# Patient Record
Sex: Male | Born: 1955 | Race: White | Hispanic: No | Marital: Married | Smoking: Never smoker
Health system: Southern US, Community
[De-identification: ages and names within clinical notes are randomized; demographics above are authoritative.]

## PROBLEM LIST (undated history)

## (undated) DIAGNOSIS — C801 Malignant (primary) neoplasm, unspecified: Secondary | ICD-10-CM

## (undated) DIAGNOSIS — R5383 Other fatigue: Secondary | ICD-10-CM

## (undated) DIAGNOSIS — I4892 Unspecified atrial flutter: Secondary | ICD-10-CM

## (undated) DIAGNOSIS — I4891 Unspecified atrial fibrillation: Secondary | ICD-10-CM

## (undated) DIAGNOSIS — E785 Hyperlipidemia, unspecified: Secondary | ICD-10-CM

## (undated) DIAGNOSIS — T8859XA Other complications of anesthesia, initial encounter: Secondary | ICD-10-CM

## (undated) HISTORY — DX: Hyperlipidemia, unspecified: E78.5

## (undated) HISTORY — DX: Unspecified atrial fibrillation: I48.91

## (undated) HISTORY — DX: Unspecified atrial flutter: I48.92

## (undated) HISTORY — DX: Other fatigue: R53.83

## (undated) HISTORY — PX: CARDIOVERSION: SHX1299

## (undated) HISTORY — PX: MINIMALLY INVASIVE MAZE PROCEDURE: SHX6244

## (undated) HISTORY — PX: CHOLECYSTECTOMY: SHX55

---

## 2004-04-03 HISTORY — PX: GALLBLADDER SURGERY: SHX652

## 2004-06-23 ENCOUNTER — Emergency Department (HOSPITAL_COMMUNITY): Admission: EM | Admit: 2004-06-23 | Discharge: 2004-06-23 | Payer: Self-pay | Admitting: Family Medicine

## 2004-06-30 ENCOUNTER — Ambulatory Visit: Payer: Self-pay | Admitting: Internal Medicine

## 2004-07-14 ENCOUNTER — Ambulatory Visit: Payer: Self-pay | Admitting: Family Medicine

## 2004-07-22 ENCOUNTER — Encounter: Admission: RE | Admit: 2004-07-22 | Discharge: 2004-07-22 | Payer: Self-pay | Admitting: Internal Medicine

## 2004-08-15 ENCOUNTER — Ambulatory Visit: Payer: Self-pay | Admitting: Internal Medicine

## 2004-09-23 ENCOUNTER — Encounter: Admission: RE | Admit: 2004-09-23 | Discharge: 2004-09-23 | Payer: Self-pay | Admitting: Family Medicine

## 2005-02-09 ENCOUNTER — Ambulatory Visit (HOSPITAL_COMMUNITY): Admission: RE | Admit: 2005-02-09 | Discharge: 2005-02-09 | Payer: Self-pay | Admitting: Surgery

## 2006-04-02 IMAGING — US US RENAL
1 series · 14 of 25 positions shown · non-contrast
Comparison: none

CLINICAL DATA: Recurrent UTI. 
 RENAL ULTRASOUND: 

 Both kidneys are within normal limits in size and parenchymal echogenicity. There is no evidence of renal parenchymal lesions or hydronephrosis.  The right kidney measures 12.4cm long with the left kidney measuring 12.1cm long.  Urinary bladder is sonographically unremarkable.

[Series 1: unknown · 0.28mm/px · 14 of 30 slices shown]
[im 1/30]
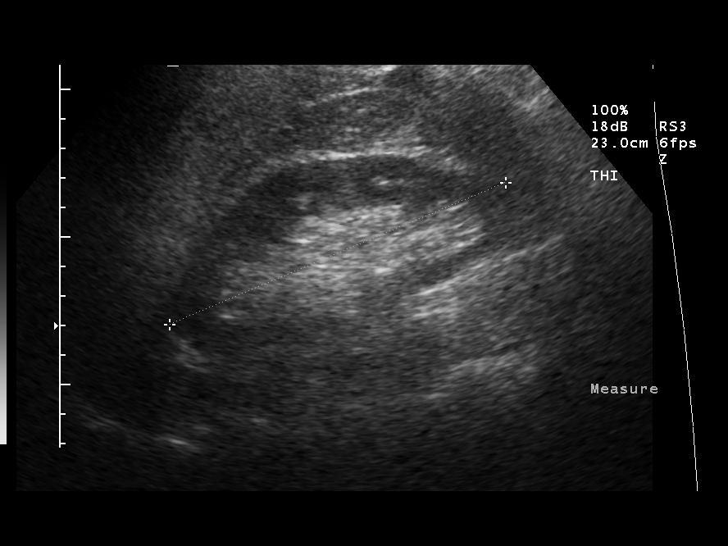
[im 3/30]
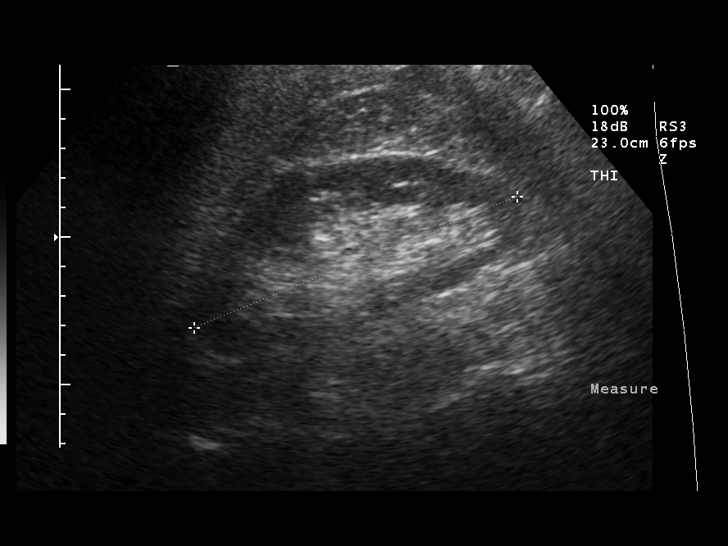
[im 5/30]
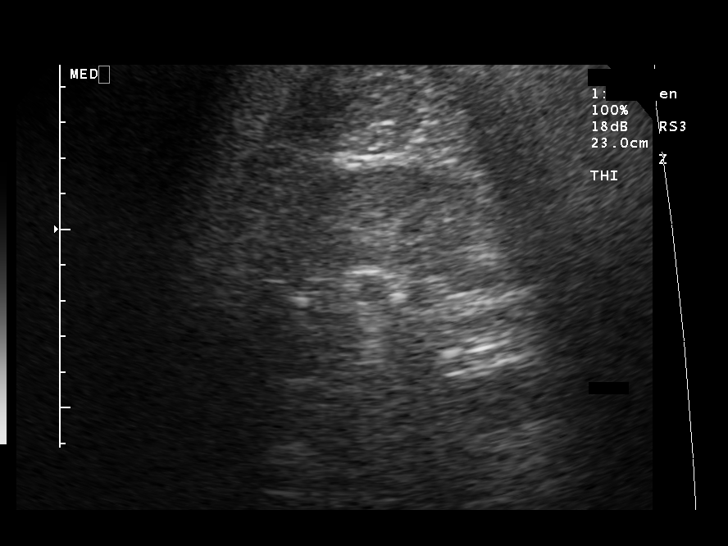
[im 8/30]
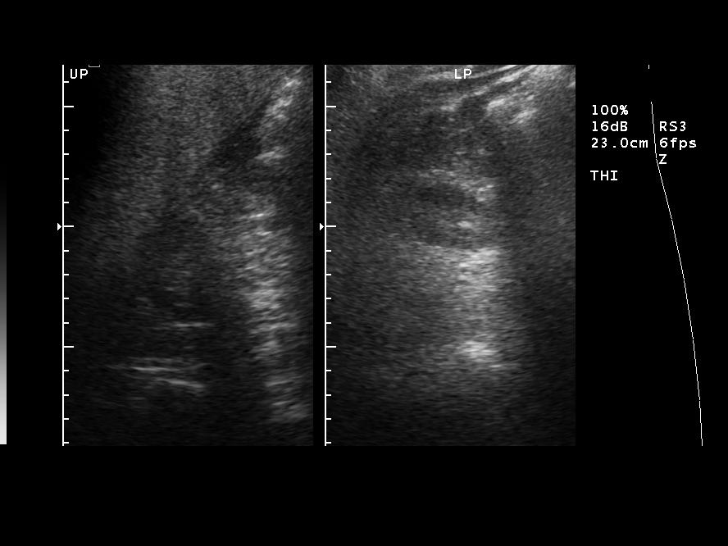
[im 10/30]
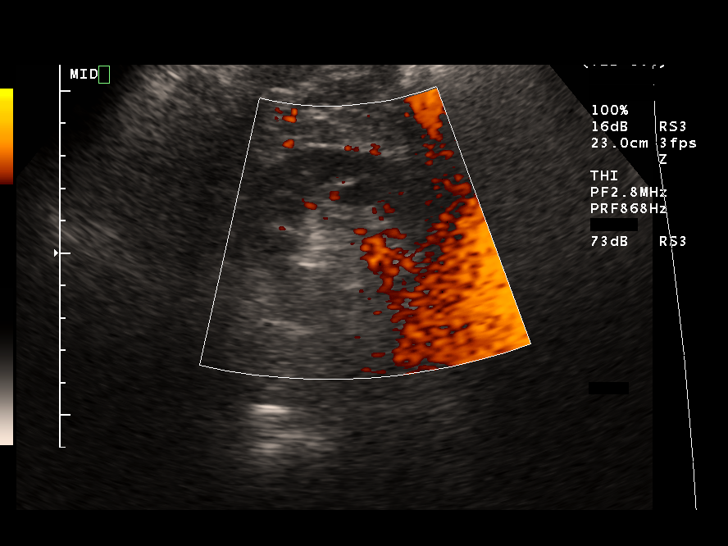
[im 11/30]
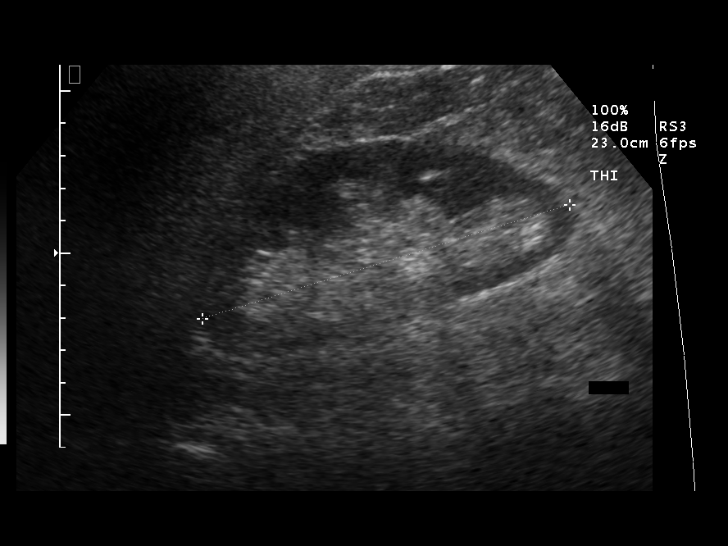
[im 14/30]
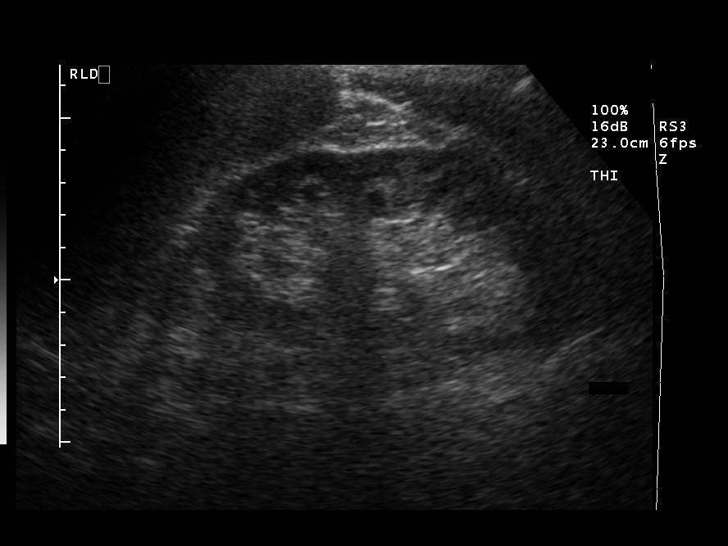
[im 16/30]
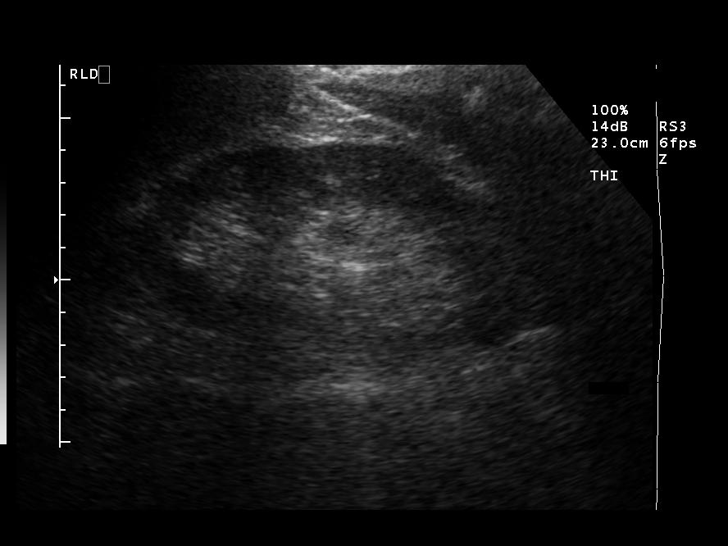
[im 19/30]
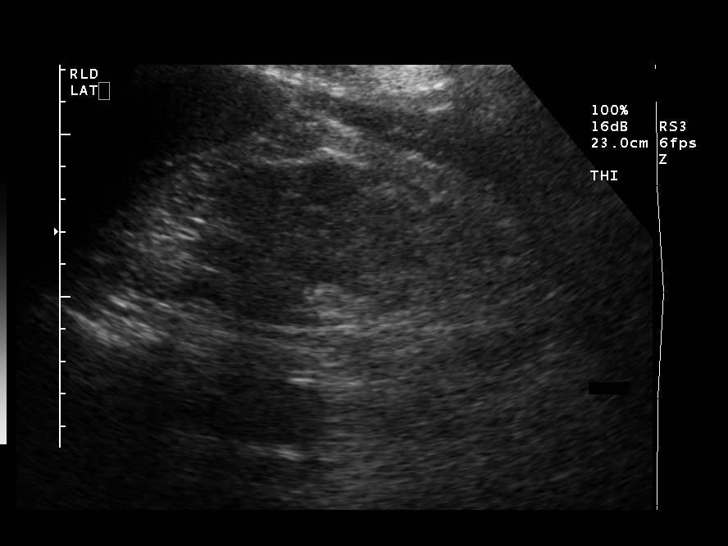
[im 20/30]
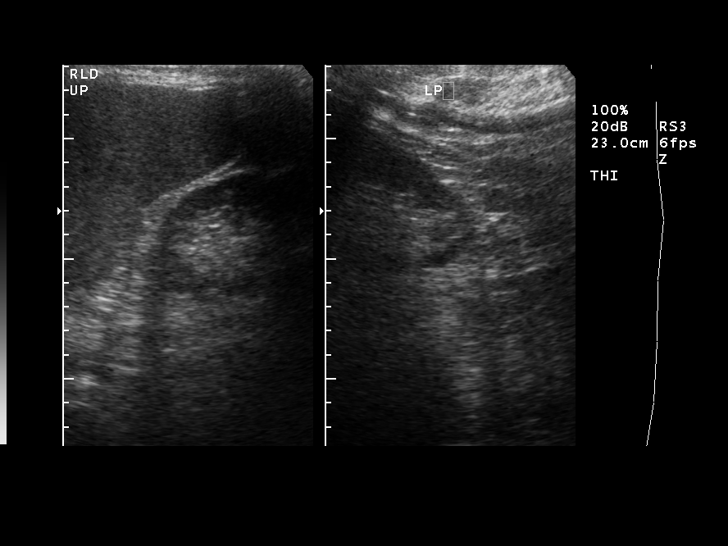
[im 22/30]
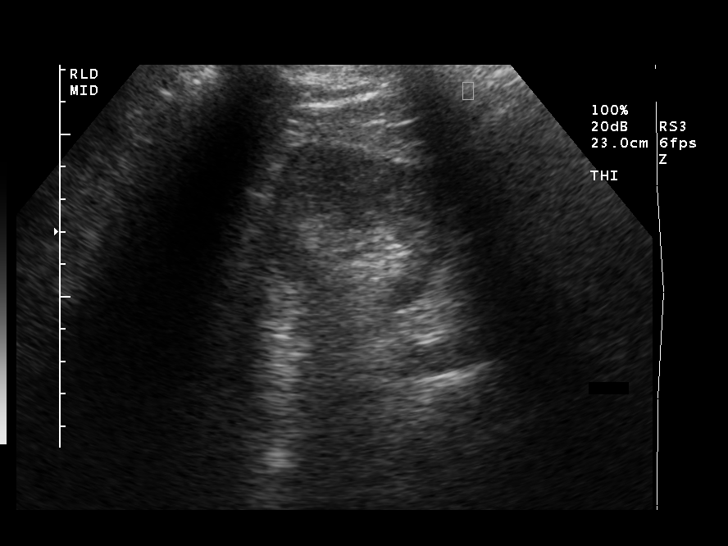
[im 25/30]
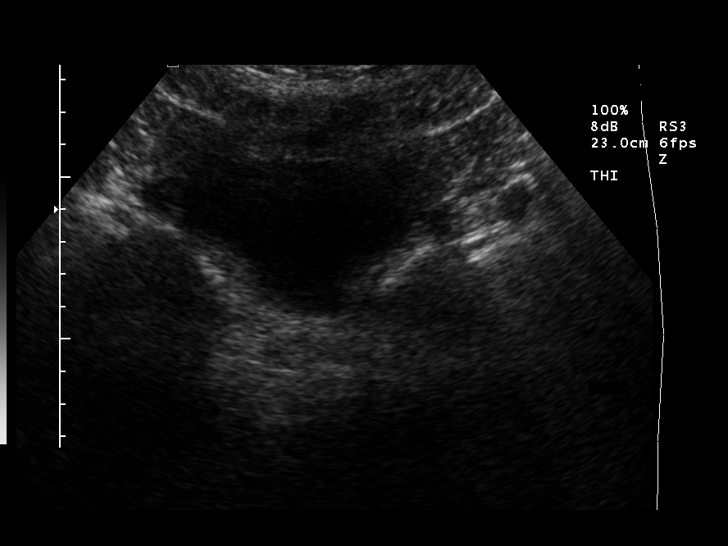
[im 27/30]
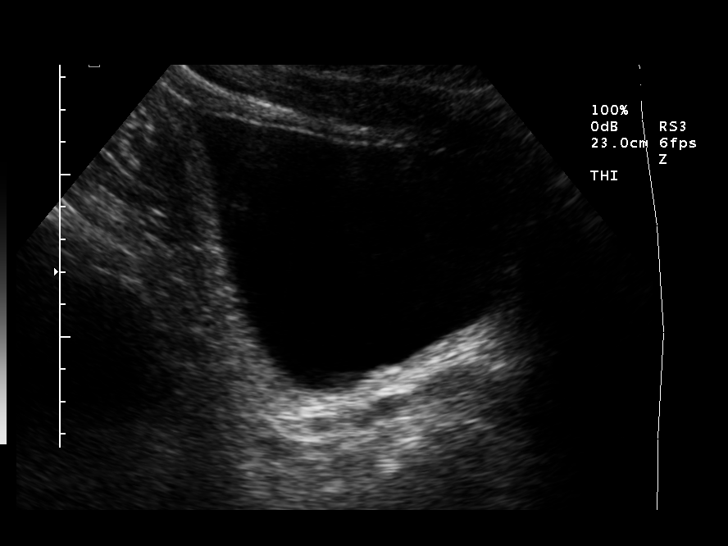
[im 30/30]
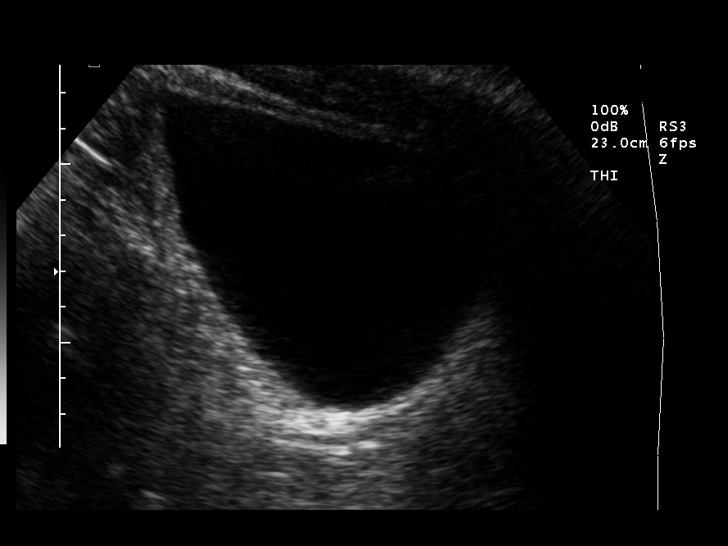

[14 of 25 positions shown; findings below may reference images not displayed]

IMPRESSION: Normal study.

## 2006-06-04 IMAGING — US US ABDOMEN COMPLETE
1 series · 14 of 25 positions shown · non-contrast
Comparison: None.

CLINICAL DATA: Right upper quadrant pain.  
 COMPLETE ABDOMINAL ULTRASOUND:

[Series 1: unknown · 0.27mm/px · 14 of 66 slices shown]
[im 1/66]
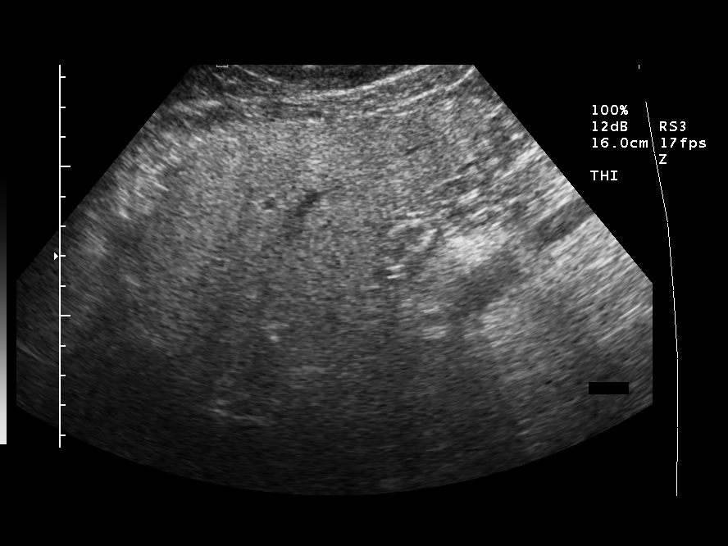
[im 6/66]
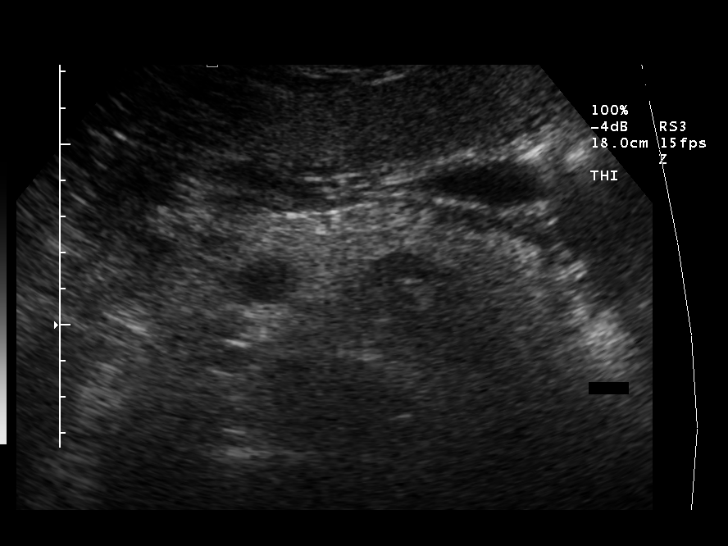
[im 11/66]
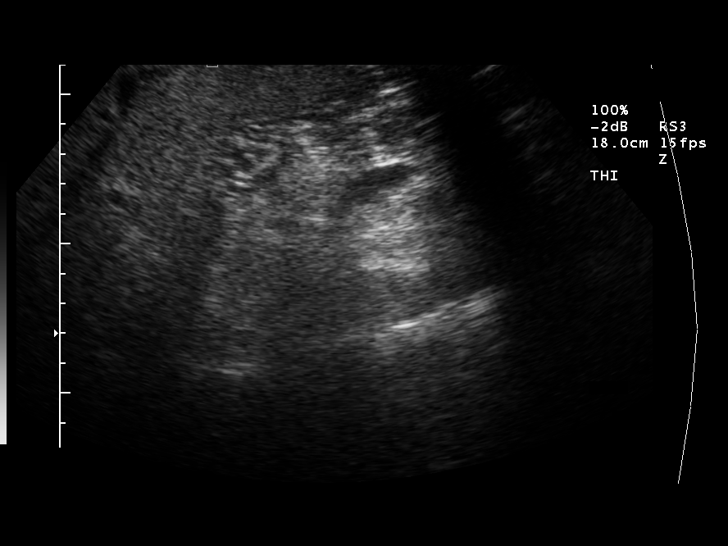
[im 17/66]
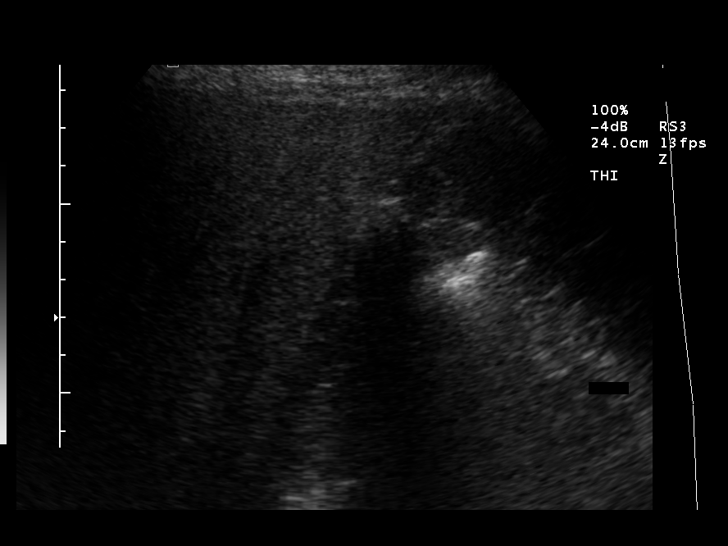
[im 22/66]
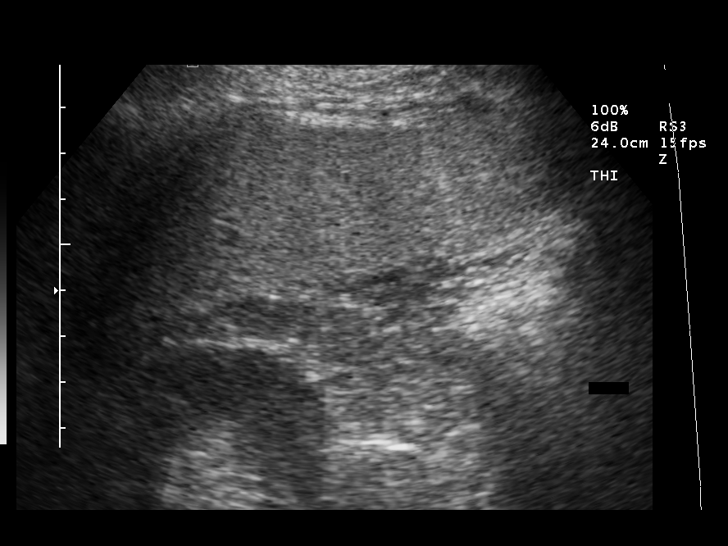
[im 25/66]
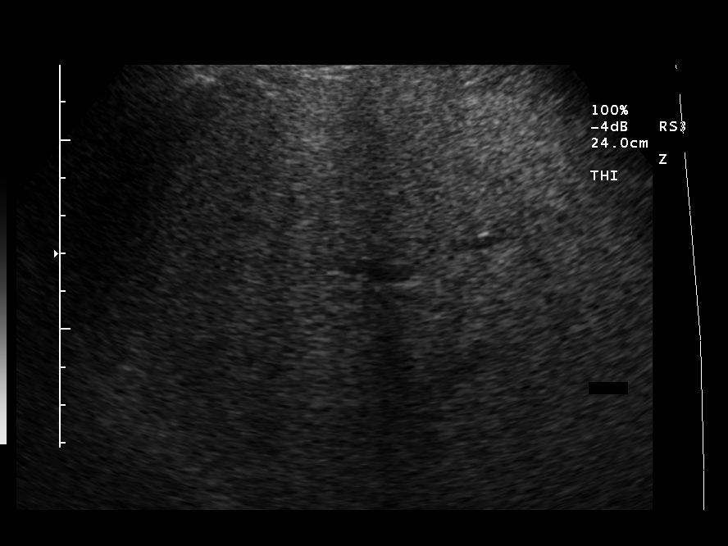
[im 30/66]
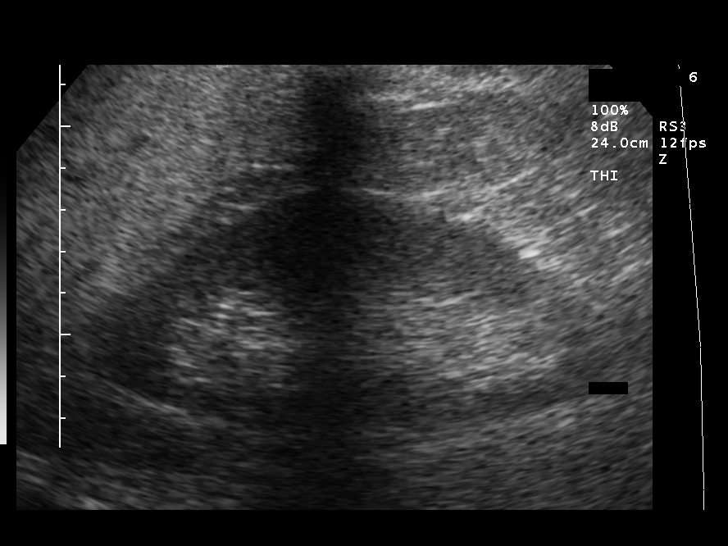
[im 36/66]
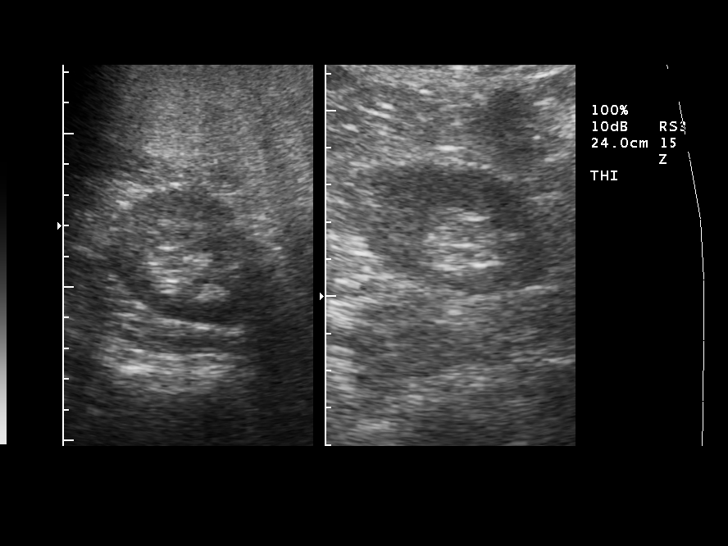
[im 41/66]
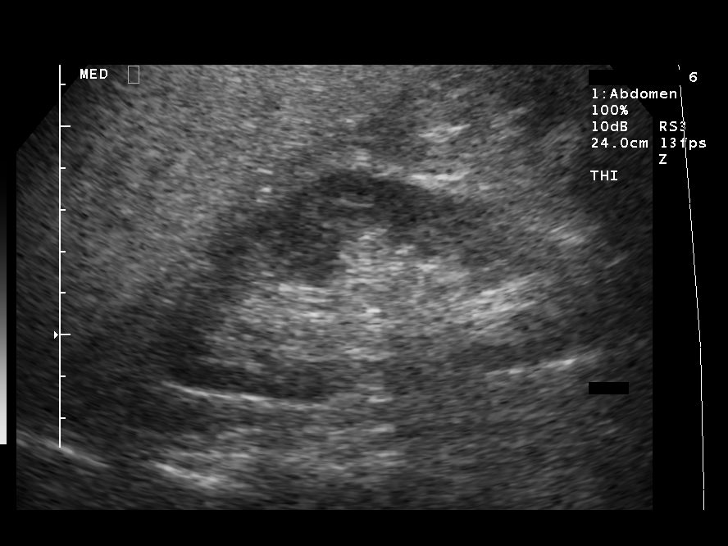
[im 44/66]
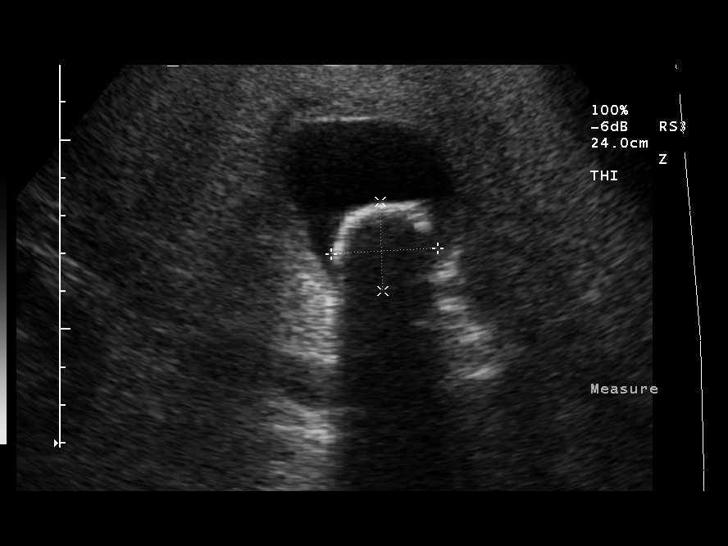
[im 49/66]
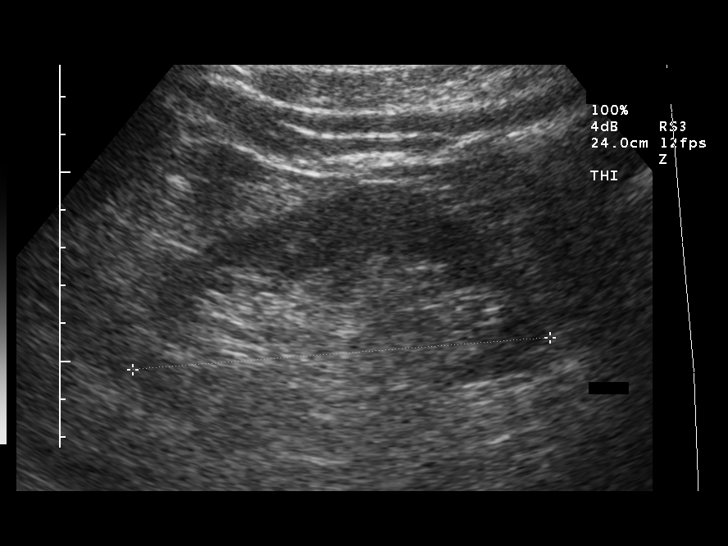
[im 55/66]
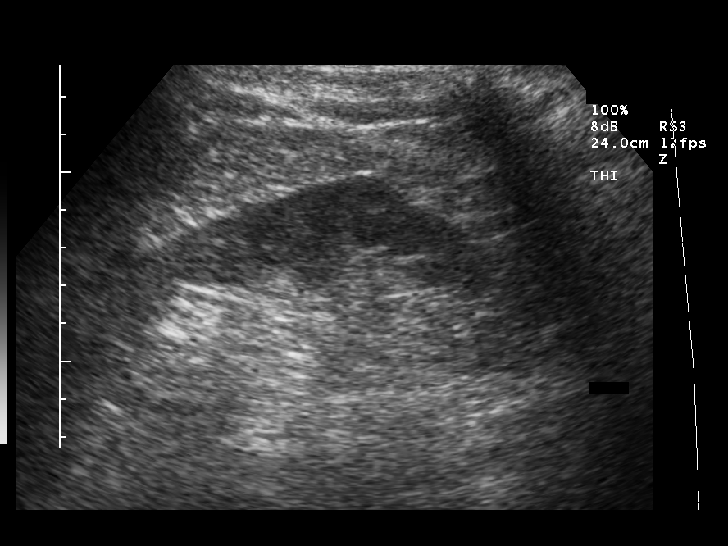
[im 60/66]
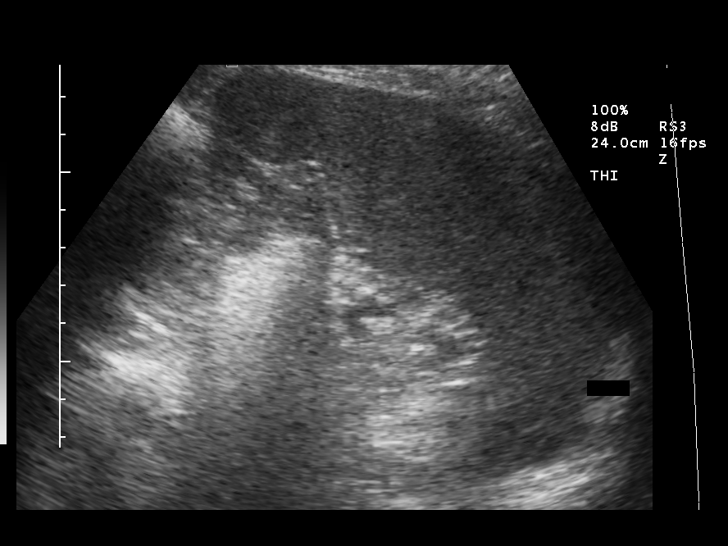
[im 66/66]
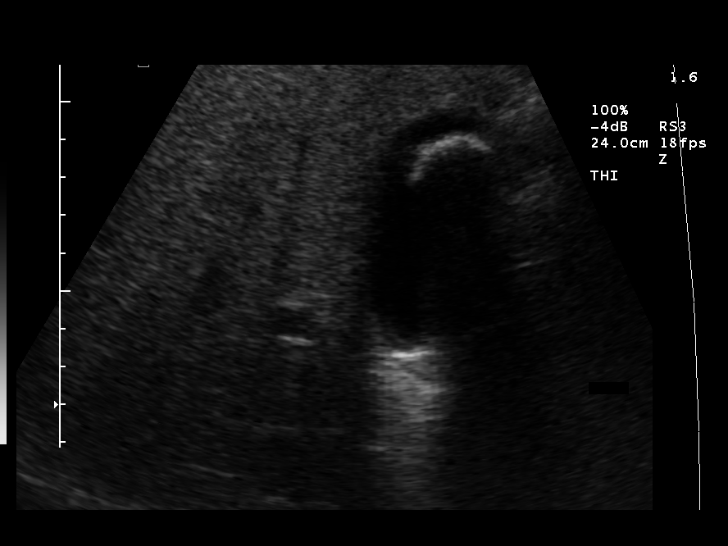

[14 of 25 positions shown; findings below may reference images not displayed]

There is a large (2.8 x 2.4cm) gallstone within the lumen of the gallbladder, which is not significantly dilated.  No significant wall thickening or edema.  Common duct 4.3mm.  
 There are no focal lesions of the liver or spleen.  Pancreas is suboptimally visualized due to overlying bowel gas.  Kidneys unremarkable.  The proximal aorta and much of the IVC are obscured by bowel gas.  The mid abdominal aorta is 2.2cm.  No ascites.
IMPRESSION: 1.  This study is technically limited due to bowel gas and the patient?s body habitus ? the pancreas, IVC, and some of the aorta are obscured. 
 2.  There is a large gallstone.

## 2006-10-21 IMAGING — RF DG CHOLANGIOGRAM OPERATIVE
1 series · 4 of 4 positions shown · non-contrast
Comparison: none

CLINICAL DATA: Cholelithiasis

[Series 1: run · 4 of 67 frames shown]
[frame 11/67]
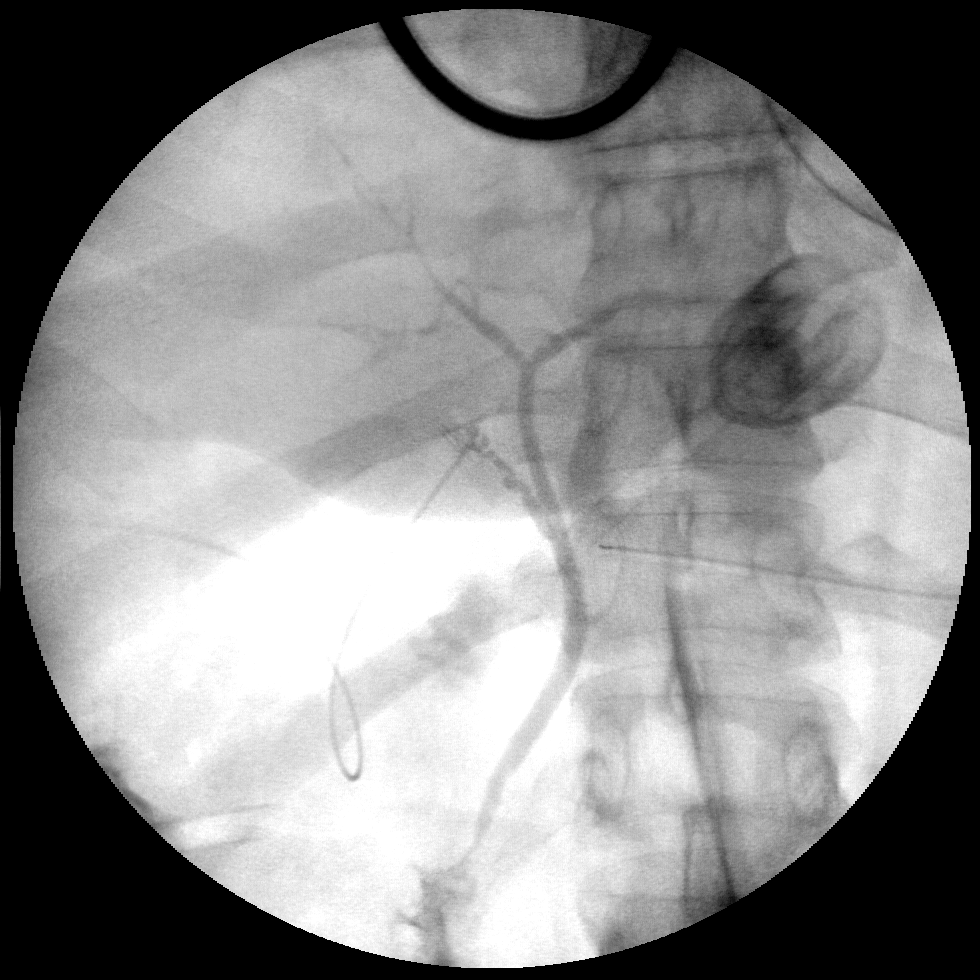
[frame 34/67]
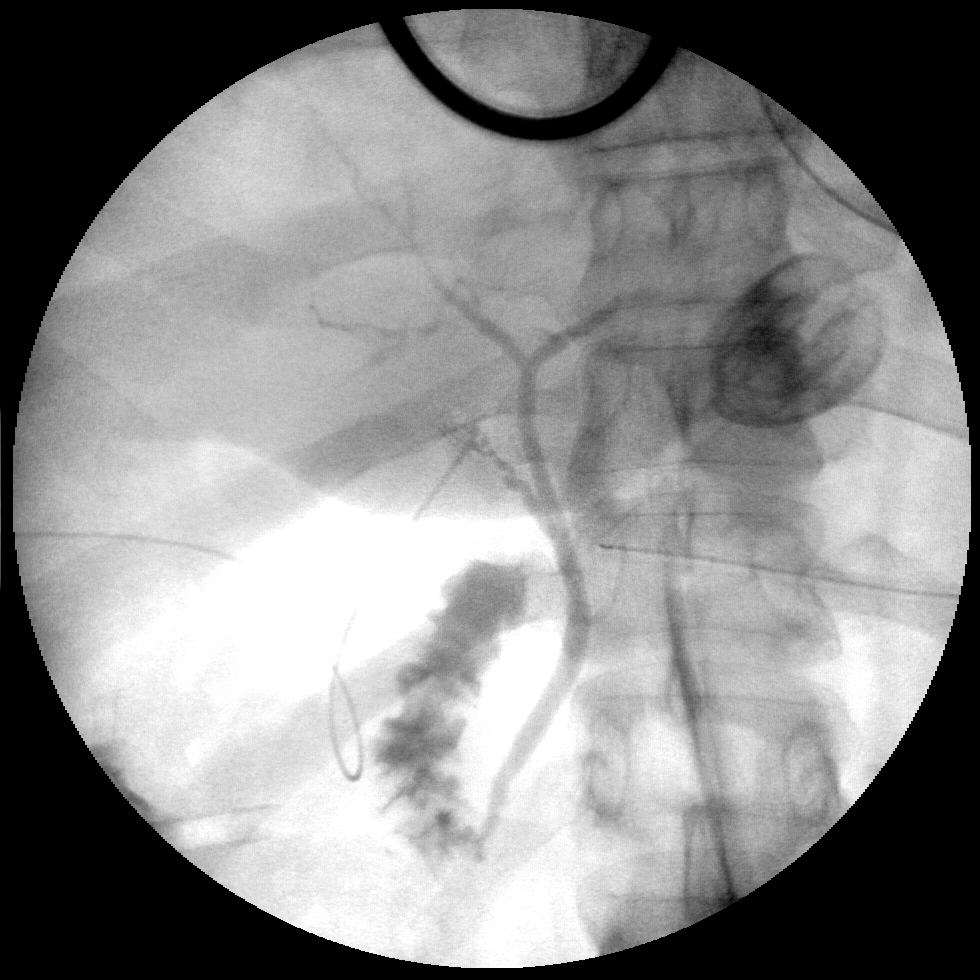
[frame 57/67]
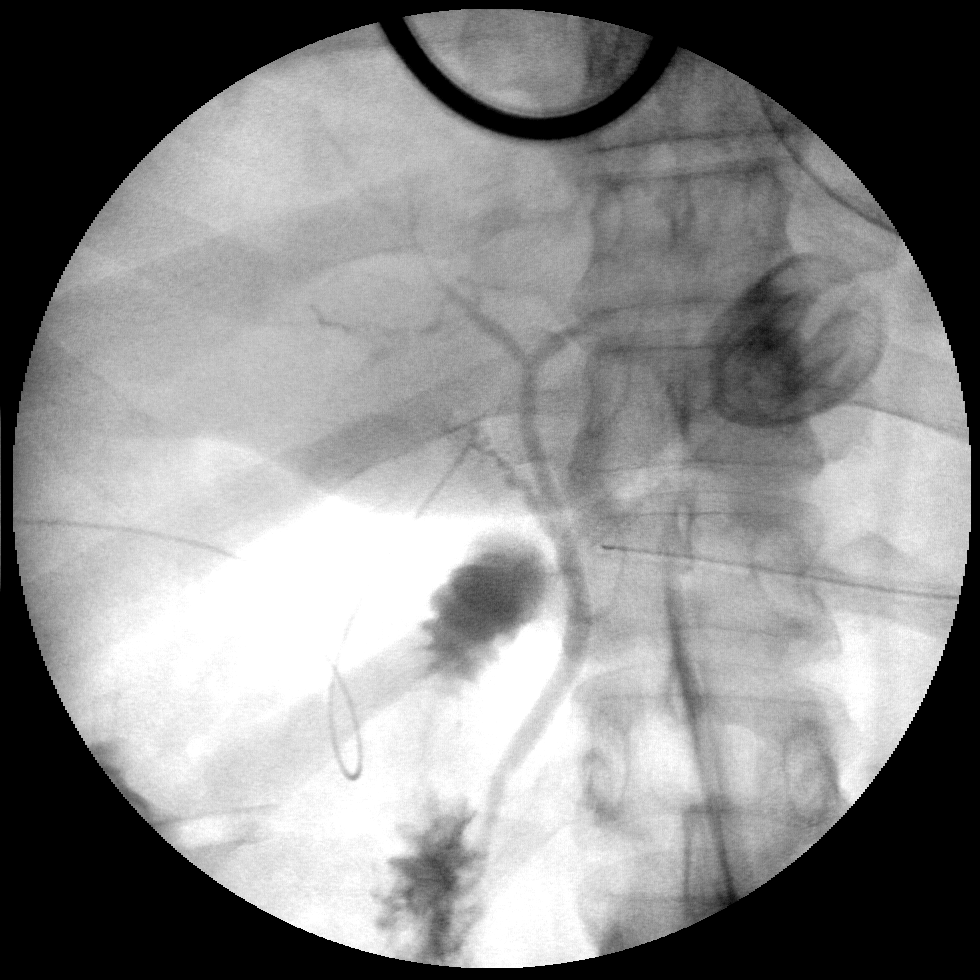
[frame 67/67]
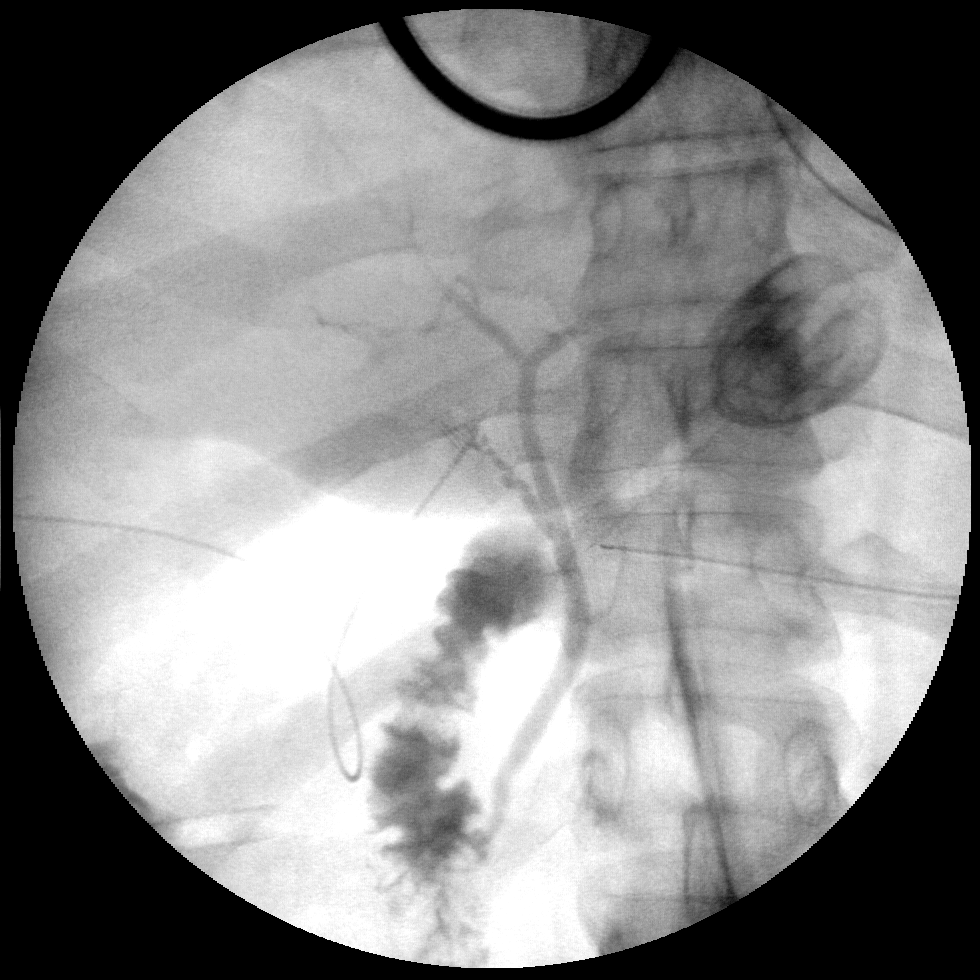

[4 of 4 positions shown; findings below may reference images not displayed]

INTRAOPERATIVE CHOLANGIOGRAM:

67  images from intraoperative C-arm fluoroscopy demonstrate  opacification of
the common bile duct. No filling defects to suggest retained stones. There is
incomplete evaluation of intrahepatic biliary tree, which appears decompressed
centrally. Contrast appears to flow on into decompressed duodenum.
IMPRESSION: 1. Negative for retained common duct stone

## 2017-03-05 ENCOUNTER — Encounter: Payer: Self-pay | Admitting: Family Medicine

## 2018-06-21 ENCOUNTER — Encounter: Payer: Self-pay | Admitting: Internal Medicine

## 2018-06-21 ENCOUNTER — Other Ambulatory Visit: Payer: Self-pay

## 2018-06-21 ENCOUNTER — Ambulatory Visit (INDEPENDENT_AMBULATORY_CARE_PROVIDER_SITE_OTHER): Admitting: Internal Medicine

## 2018-06-21 VITALS — BP 100/70 | HR 84 | Temp 98.2°F | Ht 72.0 in | Wt 235.4 lb

## 2018-06-21 DIAGNOSIS — E559 Vitamin D deficiency, unspecified: Secondary | ICD-10-CM

## 2018-06-21 DIAGNOSIS — Z13818 Encounter for screening for other digestive system disorders: Secondary | ICD-10-CM

## 2018-06-21 DIAGNOSIS — N62 Hypertrophy of breast: Secondary | ICD-10-CM | POA: Insufficient documentation

## 2018-06-21 DIAGNOSIS — Z0000 Encounter for general adult medical examination without abnormal findings: Secondary | ICD-10-CM

## 2018-06-21 DIAGNOSIS — Z13220 Encounter for screening for lipoid disorders: Secondary | ICD-10-CM

## 2018-06-21 DIAGNOSIS — Z23 Encounter for immunization: Secondary | ICD-10-CM

## 2018-06-21 DIAGNOSIS — Z1329 Encounter for screening for other suspected endocrine disorder: Secondary | ICD-10-CM | POA: Diagnosis not present

## 2018-06-21 DIAGNOSIS — Z125 Encounter for screening for malignant neoplasm of prostate: Secondary | ICD-10-CM

## 2018-06-21 LAB — COMPREHENSIVE METABOLIC PANEL
ALT: 17 U/L (ref 0–53)
AST: 14 U/L (ref 0–37)
Albumin: 4 g/dL (ref 3.5–5.2)
Alkaline Phosphatase: 79 U/L (ref 39–117)
BILIRUBIN TOTAL: 0.5 mg/dL (ref 0.2–1.2)
BUN: 5 mg/dL — ABNORMAL LOW (ref 6–23)
CO2: 28 meq/L (ref 19–32)
CREATININE: 0.82 mg/dL (ref 0.40–1.50)
Calcium: 9 mg/dL (ref 8.4–10.5)
Chloride: 104 mEq/L (ref 96–112)
GFR: 95.43 mL/min (ref >60.00)
GLUCOSE: 96 mg/dL (ref 70–99)
Potassium: 4.2 mEq/L (ref 3.5–5.1)
Sodium: 141 mEq/L (ref 135–145)
Total Protein: 5.9 g/dL — ABNORMAL LOW (ref 6.0–8.3)

## 2018-06-21 LAB — CBC WITH DIFFERENTIAL/PLATELET
BASOS ABS: 0 10*3/uL (ref 0.0–0.1)
Basophils Relative: 0.7 % (ref 0.0–3.0)
Eosinophils Absolute: 0.1 10*3/uL (ref 0.0–0.7)
Eosinophils Relative: 2 % (ref 0.0–5.0)
HCT: 44.3 % (ref 39.0–52.0)
Hemoglobin: 15.1 g/dL (ref 13.0–17.0)
LYMPHS ABS: 1.3 10*3/uL (ref 0.7–4.0)
Lymphocytes Relative: 21.2 % (ref 12.0–46.0)
MCHC: 34.1 g/dL (ref 30.0–36.0)
MCV: 86.8 fl (ref 78.0–100.0)
MONOS PCT: 9.7 % (ref 3.0–12.0)
Monocytes Absolute: 0.6 10*3/uL (ref 0.1–1.0)
NEUTROS ABS: 4.2 10*3/uL (ref 1.4–7.7)
NEUTROS PCT: 66.4 % (ref 43.0–77.0)
PLATELETS: 222 10*3/uL (ref 150.0–400.0)
RBC: 5.1 Mil/uL (ref 4.22–5.81)
RDW: 14.9 % (ref 11.5–15.5)
WBC: 6.3 10*3/uL (ref 4.0–10.5)

## 2018-06-21 LAB — PSA: PSA: 2.21 ng/mL (ref 0.10–4.00)

## 2018-06-21 LAB — TESTOSTERONE: Testosterone: 720.91 ng/dL (ref 300.00–890.00)

## 2018-06-21 LAB — TSH: TSH: 2.8 u[IU]/mL (ref 0.35–4.50)

## 2018-06-21 LAB — VITAMIN D 25 HYDROXY (VIT D DEFICIENCY, FRACTURES): VITD: 18.71 ng/mL — ABNORMAL LOW (ref 30.00–100.00)

## 2018-06-21 LAB — LIPID PANEL
Cholesterol: 185 mg/dL (ref 0–200)
HDL: 31.1 mg/dL — ABNORMAL LOW (ref >39.00)
NonHDL: 154.34
TRIGLYCERIDES: 202 mg/dL — AB (ref 0.0–149.0)
Total CHOL/HDL Ratio: 6
VLDL: 40.4 mg/dL — ABNORMAL HIGH (ref 0.0–40.0)

## 2018-06-21 LAB — LDL CHOLESTEROL, DIRECT: Direct LDL: 123 mg/dL

## 2018-06-21 LAB — T4, FREE: Free T4: 0.84 ng/dL (ref 0.60–1.60)

## 2018-06-21 NOTE — Patient Instructions (Signed)
Tdap Vaccine (Tetanus, Diphtheria and Pertussis): What You Need to Know  1. Why get vaccinated?  Tetanus, diphtheria and pertussis are very serious diseases. Tdap vaccine can protect us from these diseases. And, Tdap vaccine given to pregnant women can protect newborn babies against pertussis..  TETANUS (Lockjaw) is rare in the [place] today. It causes painful muscle tightening and stiffness, usually all over the body.  · It can lead to tightening of muscles in the head and neck so you can't open your mouth, swallow, or sometimes even breathe. Tetanus kills about 1 out of 10 people who are infected even after receiving the best medical care.  DIPHTHERIA is also rare in the [place] today. It can cause a thick coating to form in the back of the throat.  · It can lead to breathing problems, heart failure, paralysis, and death.  PERTUSSIS (Whooping Cough) causes severe coughing spells, which can cause difficulty breathing, vomiting and disturbed sleep.  · It can also lead to weight loss, incontinence, and rib fractures. Up to 2 in 100 adolescents and 5 in 100 adults with pertussis are hospitalized or have complications, which could include pneumonia or death.  These diseases are caused by bacteria. Diphtheria and pertussis are spread from person to person through secretions from coughing or sneezing. Tetanus enters the body through cuts, scratches, or wounds.  Before vaccines, as many as 200,000 cases of diphtheria, 200,000 cases of pertussis, and hundreds of cases of tetanus, were reported in the [place] each year. Since vaccination began, reports of cases for tetanus and diphtheria have dropped by about 99% and for pertussis by about 80%.  2. Tdap vaccine  Tdap vaccine can protect adolescents and adults from tetanus, diphtheria, and pertussis. One dose of Tdap is routinely given at age 11 or 12. People who did not get Tdap at that age should get it as soon as possible.  Tdap is especially important  for healthcare professionals and anyone having close contact with a baby younger than 12 months.  Pregnant women should get a dose of Tdap during every pregnancy, to protect the newborn from pertussis. Infants are most at risk for severe, life-threatening complications from pertussis.  Another vaccine, called Td, protects against tetanus and diphtheria, but not pertussis. A Td booster should be given every 10 years. Tdap may be given as one of these boosters if you have never gotten Tdap before. Tdap may also be given after a severe cut or burn to prevent tetanus infection.  Your doctor or the person giving you the vaccine can give you more information.  Tdap may safely be given at the same time as other vaccines.  3. Some people should not get this vaccine  · A person who has ever had a life-threatening allergic reaction after a previous dose of any diphtheria, tetanus or pertussis containing vaccine, OR has a severe allergy to any part of this vaccine, should not get Tdap vaccine. Tell the person giving the vaccine about any severe allergies.  · Anyone who had coma or long repeated seizures within 7 days after a childhood dose of DTP or DTaP, or a previous dose of Tdap, should not get Tdap, unless a cause other than the vaccine was found. They can still get Td.  · Talk to your doctor if you:  ? have seizures or another nervous system problem,  ? had severe pain or swelling after any vaccine containing diphtheria, tetanus or pertussis,  ? ever had a condition   called Guillain-Barré Syndrome (GBS),  ? aren't feeling well on the day the shot is scheduled.  4. Risks  With any medicine, including vaccines, there is a chance of side effects. These are usually mild and go away on their own. Serious reactions are also possible but are rare.  Most people who get Tdap vaccine do not have any problems with it.  Mild problems following Tdap  (Did not interfere with activities)  · Pain where the shot was given (about 3 in 4  adolescents or 2 in 3 adults)  · Redness or swelling where the shot was given (about 1 person in 5)  · Mild fever of at least 100.4°F (up to about 1 in 25 adolescents or 1 in 100 adults)  · Headache (about 3 or 4 people in 10)  · Tiredness (about 1 person in 3 or 4)  · Nausea, vomiting, diarrhea, stomach ache (up to 1 in 4 adolescents or 1 in 10 adults)  · Chills, sore joints (about 1 person in 10)  · Body aches (about 1 person in 3 or 4)  · Rash, swollen glands (uncommon)  Moderate problems following Tdap  (Interfered with activities, but did not require medical attention)  · Pain where the shot was given (up to 1 in 5 or 6)  · Redness or swelling where the shot was given (up to about 1 in 16 adolescents or 1 in 12 adults)  · Fever over 102°F (about 1 in 100 adolescents or 1 in 250 adults)  · Headache (about 1 in 7 adolescents or 1 in 10 adults)  · Nausea, vomiting, diarrhea, stomach ache (up to 1 or 3 people in 100)  · Swelling of the entire arm where the shot was given (up to about 1 in 500).  Severe problems following Tdap  (Unable to perform usual activities; required medical attention)  · Swelling, severe pain, bleeding and redness in the arm where the shot was given (rare).  Problems that could happen after any vaccine:  · People sometimes faint after a medical procedure, including vaccination. Sitting or lying down for about 15 minutes can help prevent fainting, and injuries caused by a fall. Tell your doctor if you feel dizzy, or have vision changes or ringing in the ears.  · Some people get severe pain in the shoulder and have difficulty moving the arm where a shot was given. This happens very rarely.  · Any medication can cause a severe allergic reaction. Such reactions from a vaccine are very rare, estimated at fewer than 1 in a million doses, and would happen within a few minutes to a few hours after the vaccination.  As with any medicine, there is a very remote chance of a vaccine causing a serious  injury or death.  The safety of vaccines is always being monitored. For more information, visit: www.cdc.gov/vaccinesafety/  5. What if there is a serious problem?  What should I look for?  · Look for anything that concerns you, such as signs of a severe allergic reaction, very high fever, or unusual behavior.  Signs of a severe allergic reaction can include hives, swelling of the face and throat, difficulty breathing, a fast heartbeat, dizziness, and weakness. These would usually start a few minutes to a few hours after the vaccination.  What should I do?  · If you think it is a severe allergic reaction or other emergency that can't wait, call 9-1-1 or get the person to the nearest hospital. Otherwise,   call your doctor.  · Afterward, the reaction should be reported to the Vaccine Adverse Event Reporting System (VAERS). Your doctor might file this report, or you can do it yourself through the VAERS web site at www.vaers.hhs.gov, or by calling 1-800-822-7967.  VAERS does not give medical advice.  6. The National Vaccine Injury Compensation Program  The National Vaccine Injury Compensation Program (VICP) is a federal program that was created to compensate people who may have been injured by certain vaccines.  Persons who believe they may have been injured by a vaccine can learn about the program and about filing a claim by calling 1-800-338-2382 or visiting the VICP website at www.hrsa.gov/vaccinecompensation. There is a time limit to file a claim for compensation.  7. How can I learn more?  · Ask your doctor. He or she can give you the vaccine package insert or suggest other sources of information.  · Call your local or state health department.  · Contact the Centers for Disease Control and Prevention (CDC):  ? Call 1-800-232-4636 (1-800-CDC-INFO) or  ? Visit CDC's website at www.cdc.gov/vaccines  Vaccine Information Statement Tdap Vaccine (05/27/2013)  This information is not intended to replace advice given to you  by your health care provider. Make sure you discuss any questions you have with your health care provider.  Document Released: 09/19/2011 Document Revised: 11/05/2017 Document Reviewed: 11/05/2017  Elsevier Interactive Patient Education © 2019 Elsevier Inc.

## 2018-06-21 NOTE — Progress Notes (Signed)
Chief Complaint  Patient presents with  . Establish Care    new pt    New patient  1. No concerns h/o bronchitis 03/2018-04/2018 doing better after levaquin  2. No chronic medical conditions has not seen a doctor in years other than recent acute visit    Review of Systems  Constitutional: Negative for weight loss.  HENT: Negative for hearing loss.   Eyes: Negative for blurred vision.  Respiratory: Negative for shortness of breath.   Cardiovascular: Negative for chest pain.  Gastrointestinal: Negative for abdominal pain.  Musculoskeletal: Negative for falls.  Skin: Negative for rash.  Neurological: Negative for headaches.  Psychiatric/Behavioral: Negative for depression.   History reviewed. No pertinent past medical history. Past Surgical History:  Procedure Laterality Date  . CHOLECYSTECTOMY     Family History  Problem Relation Age of Onset  . COPD Mother   . Cancer Father        lung cancer smoker age 63   . Heart disease Father        bypass at 79    Social History   Socioeconomic History  . Marital status: Married    Spouse name: Not on file  . Number of children: Not on file  . Years of education: Not on file  . Highest education level: Not on file  Occupational History  . Not on file  Social Needs  . Financial resource strain: Not on file  . Food insecurity:    Worry: Not on file    Inability: Not on file  . Transportation needs:    Medical: Not on file    Non-medical: Not on file  Tobacco Use  . Smoking status: Never Smoker  . Smokeless tobacco: Never Used  Substance and Sexual Activity  . Alcohol use: Never    Frequency: Never  . Drug use: Not Currently  . Sexual activity: Not Currently  Lifestyle  . Physical activity:    Days per week: Not on file    Minutes per session: Not on file  . Stress: Not on file  Relationships  . Social connections:    Talks on phone: Not on file    Gets together: Not on file    Attends religious service: Not on  file    Active member of club or organization: Not on file    Attends meetings of clubs or organizations: Not on file    Relationship status: Not on file  . Intimate partner violence:    Fear of current or ex partner: Not on file    Emotionally abused: Not on file    Physically abused: Not on file    Forced sexual activity: Not on file  Other Topics Concern  . 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   No Known Allergies No results found for this or any previous visit (from the past 2160 hour(s)). Objective  Body mass index is 31.93 kg/m. Wt Readings from Last 3 Encounters:  06/21/18 235 lb 6.4 oz (106.8 kg)   Temp Readings from Last 3 Encounters:  06/21/18 98.2 F (36.8 C) (Other (Comment))   BP Readings from Last 3 Encounters:  06/21/18 100/70   Pulse Readings from Last 3 Encounters:  06/21/18 84    Physical Exam Vitals signs and nursing note reviewed.  Constitutional:      Appearance: Normal appearance. He is well-developed and well-groomed.  HENT:     Head: Normocephalic and atraumatic.     Ears:     Comments: B/l ears impacted      Nose: Nose normal.     Mouth/Throat:     Pharynx: Oropharynx is clear.  Eyes:     Conjunctiva/sclera: Conjunctivae normal.     Pupils: Pupils are equal, round, and reactive to light.  Cardiovascular:     Rate and Rhythm: Normal rate and regular rhythm.     Heart sounds: Normal heart sounds. No murmur.  Pulmonary:     Effort: Pulmonary effort is normal.     Breath sounds: Normal breath sounds.  Skin:    General: Skin is warm and dry.  Neurological:     General: No focal deficit present.     Mental Status: He is alert and oriented to person, place, and time. Mental status is at baseline.     Gait: Gait normal.   Psychiatric:        Attention and Perception: Attention and perception normal.        Mood and Affect: Mood and affect normal.        Speech: Speech normal.        Behavior: Behavior normal. Behavior is cooperative.        Thought Content: Thought content normal.        Cognition and Memory: Cognition and memory normal.        Judgment: Judgment normal.   gynecomastia   Assessment   1. C/w elevated HR fit bit HR 132 manual 84 likely fit bit wrong  2. Annual  3. Gynecomastia ? If was from being on estradiol x 3 years  Plan   1. rec get another fit bite pulse is normal  Reassess at f/u  2.  Did not have flu shot  Tdap given today  Screen hep C   Check fasting labs today pt unable to urinate check in future  Never smoker FH lung cancer in dad  Check PSA Declines colonoscopy Given info about cologuard call back if wants order after finds out cost  Derm no needs for today  3. Check testosterone   Of note per pt he was taking estradiol 2016-2019 and [facility]   Provider: Dr. French Ana McLean-Scocuzza-Internal Medicine

## 2018-06-24 ENCOUNTER — Encounter: Payer: Self-pay | Admitting: Internal Medicine

## 2018-06-24 ENCOUNTER — Other Ambulatory Visit: Payer: Self-pay | Admitting: Internal Medicine

## 2018-06-24 DIAGNOSIS — E785 Hyperlipidemia, unspecified: Secondary | ICD-10-CM | POA: Insufficient documentation

## 2018-06-24 DIAGNOSIS — E559 Vitamin D deficiency, unspecified: Secondary | ICD-10-CM | POA: Insufficient documentation

## 2018-06-24 LAB — HEPATITIS C ANTIBODY
Hepatitis C Ab: NONREACTIVE
SIGNAL TO CUT-OFF: 0 (ref <1.00)

## 2018-06-24 MED ORDER — CHOLECALCIFEROL 1.25 MG (50000 UT) PO CAPS: 50000.0000 [IU] | ORAL_CAPSULE | ORAL | 1 refills | Status: DC

## 2018-09-16 ENCOUNTER — Telehealth: Payer: Self-pay | Admitting: Internal Medicine

## 2018-09-16 ENCOUNTER — Telehealth: Payer: Self-pay

## 2018-09-16 NOTE — Telephone Encounter (Signed)
[facility]ed [name]  Pt wants to "feel more himself" with male hormones  Seen for a while on estradiol Last saw 09/2018 but before this 2 years ago due to labwork cholesterol elevated and rec PCP to continue estradiol Medical director does not want to resume estradiol    [place]

## 2018-09-16 NOTE — Telephone Encounter (Signed)
Copied from [place] [phone] [id number]. Topic: General - Other >> Sep 16, 2018  9:07 AM Alanda Slim E wrote: Reason for CRM: Benjamin Stain called from [facility] to speak with Dr. Jacklynn Lewis about the Pt, regarding hormone therapy/ please advise Joanne's cb# [phone]

## 2018-09-16 NOTE — Telephone Encounter (Signed)
Call pt and see if he wants to recheck lipid and vitamin D now or wait until 12/2018 His other clinic called and needs cholesterol controlled before restarting a medicine so if he wants to restart the other medication then cholesterol needs to be controlled with diet and exercise and if repeat cholesterol elevated he will need to start a cholesterol medication before resuming his other medication   [place]

## 2018-09-16 NOTE — Telephone Encounter (Signed)
[name]  Pt wants to "feel more himself" with male hormones  Seen for a while on estradiol Last saw 09/2018 but before this 2 years ago due to [place] cholesterol elevated and rec PCP to continue estradiol Medical director does not want to resume estradiol   Fax # to clinic [phone] [name]   [facility]

## 2018-09-20 NOTE — Telephone Encounter (Signed)
He will wait until september

## 2018-10-08 ENCOUNTER — Encounter: Payer: Self-pay | Admitting: Family Medicine

## 2018-12-11 ENCOUNTER — Ambulatory Visit: Admitting: Internal Medicine

## 2018-12-26 ENCOUNTER — Encounter: Payer: Self-pay | Admitting: Internal Medicine

## 2018-12-26 ENCOUNTER — Other Ambulatory Visit: Payer: Self-pay

## 2018-12-26 ENCOUNTER — Telehealth: Payer: Self-pay

## 2018-12-26 ENCOUNTER — Ambulatory Visit (INDEPENDENT_AMBULATORY_CARE_PROVIDER_SITE_OTHER): Admitting: Internal Medicine

## 2018-12-26 VITALS — BP 104/66 | HR 58 | Ht 72.0 in | Wt 235.0 lb

## 2018-12-26 DIAGNOSIS — E559 Vitamin D deficiency, unspecified: Secondary | ICD-10-CM

## 2018-12-26 DIAGNOSIS — Z1329 Encounter for screening for other suspected endocrine disorder: Secondary | ICD-10-CM

## 2018-12-26 DIAGNOSIS — Z125 Encounter for screening for malignant neoplasm of prostate: Secondary | ICD-10-CM

## 2018-12-26 DIAGNOSIS — Z0000 Encounter for general adult medical examination without abnormal findings: Secondary | ICD-10-CM

## 2018-12-26 DIAGNOSIS — Z1389 Encounter for screening for other disorder: Secondary | ICD-10-CM

## 2018-12-26 DIAGNOSIS — E785 Hyperlipidemia, unspecified: Secondary | ICD-10-CM

## 2018-12-26 NOTE — Progress Notes (Signed)
Telephone Note  I connected with Mahmood Boehringer   on 12/26/18 at  8:30 AM EDT by telephone  and verified that I am speaking with the correct person using two identifiers.  Location patient: home Location provider:work or home office Persons participating in the virtual visit: patient, provider  I discussed the limitations of evaluation and management by telemedicine and the availability of in person appointments. The patient expressed understanding and agreed to proceed.   HPI: 1. F/u HLD + he reports changing diet to placed based and cutting back on junk food 2. [facility] [place] and changed meds to 2 estrogen patches Q 3.5 days which makes him feel himself  3. He reports bradycardia w/o sx's and doing study outside of here where EKG monitored x 1 week and cards reviewed will get data  4. Wants homocysteine checked and will get at an outside lab  ROS: See pertinent positives and negatives per HPI.  Past Medical History:  Diagnosis Date  . HLD (hyperlipidemia)     Past Surgical History:  Procedure Laterality Date  . CHOLECYSTECTOMY      Family History  Problem Relation Age of Onset  . COPD Mother   . Cancer Father        lung cancer smoker age 56   . 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  estradiol (VIVELLE-DOT) 0.1 MG/24HR patch, Place 2 patches onto the skin. Q3.5 days, Disp: , Rfl:   EXAM:  VITALS per patient if applicable:  GENERAL: alert, oriented, appears well and in no acute distress  HEENT: atraumatic, conjunttiva clear, no obvious abnormalities on inspection of external nose and ears  NECK: normal movements of the head and neck  LUNGS: on inspection no signs of respiratory distress, breathing rate appears normal, no obvious gross SOB,  gasping or wheezing  CV: no obvious cyanosis  MS: moves all visible extremities without noticeable abnormality  PSYCH/NEURO: pleasant and cooperative, no obvious depression or anxiety, speech and thought processing grossly intact  ASSESSMENT AND PLAN:  Discussed the following assessment and plan:  Hyperlipidemia, unspecified hyperlipidemia type - Plan: Lipid panel, Comprehensive metabolic panel  Vitamin D deficiency - Plan: Vitamin D (25 hydroxy), CANCELED: Vitamin D (25 hydroxy)  HM-will do physical at f/u  Declines flu shot  Tdap utd  Hep C negative   Check fasting labs 06/23/18 Never smoker FH lung cancer in dad  PSA 2.21 06/21/18  Declines colonoscopy Given info about cologuard call back if wants order after finds out cost disc again today 12/26/18 will disc again 06/2018 f/u as pt not sure of cost  Derm no needs for now   -we discussed possible serious and likely etiologies, options for evaluation and workup, limitations of telemedicine visit vs in person visit, treatment, treatment risks and precautions. Pt prefers to treat via telemedicine empirically rather then risking or undertaking an in person visit at this moment. Patient agrees to seek prompt in person care if worsening, new symptoms arise, or if is not improving with treatment.   I discussed the assessment and treatment plan with the patient. The patient was provided an opportunity to ask questions and all were answered. The patient agreed with the  plan and demonstrated an understanding of the instructions.   The patient was advised to call back or seek an in-person evaluation if the symptoms worsen or if the condition fails to improve as anticipated.  Time spent 15 minutes  Delorise Jackson, MD

## 2018-12-26 NOTE — Telephone Encounter (Signed)
Informed patient that labs were placed in the future. The date is automatic. He caqn get his labs in march.

## 2018-12-26 NOTE — Telephone Encounter (Signed)
Copied from [id number]. Topic: General - Other >> Dec 26, 2018 12:17 PM Wynetta Emery, [place] C wrote: Reason for CRM: pt called in for clarity. Pt says that he had a visit today and was told to have labs in March, pt says that he is showing that lab orders has expected date of today instead? Pt would like to be advised further on when should he have his labs?   CB: [phone]

## 2019-01-09 ENCOUNTER — Encounter: Payer: Self-pay | Admitting: Internal Medicine

## 2019-01-10 ENCOUNTER — Other Ambulatory Visit: Payer: Self-pay | Admitting: Internal Medicine

## 2019-01-10 DIAGNOSIS — I4891 Unspecified atrial fibrillation: Secondary | ICD-10-CM

## 2019-02-18 ENCOUNTER — Telehealth: Payer: Self-pay | Admitting: *Deleted

## 2019-02-18 NOTE — Telephone Encounter (Signed)
We do not have any EKGs.

## 2019-02-18 NOTE — Telephone Encounter (Signed)
I dont have any EKGs call this office back Dr. Janeece Riggers [facility]   [facility]

## 2019-02-18 NOTE — Telephone Encounter (Signed)
Copied from [place] [phone]. Topic: General - Other >> Feb 18, 2019 10:57 AM Leward Quan A wrote: Reason for CRM: [facility] Dr Janeece Riggers office called to request any EKG that the patient may have on file showing A Fib need it today please patient have an appointment tomorrow 02/19/2019. Please fax to Fax# [phone]

## 2019-02-19 DIAGNOSIS — I480 Paroxysmal atrial fibrillation: Secondary | ICD-10-CM | POA: Insufficient documentation

## 2019-02-21 ENCOUNTER — Ambulatory Visit: Admitting: Internal Medicine

## 2019-03-10 ENCOUNTER — Telehealth: Payer: Self-pay

## 2019-03-10 NOTE — Telephone Encounter (Signed)
Informed patient he can not because he cardiologist ordered it for [facility]. He would need to have it done at their office.

## 2019-03-10 NOTE — Telephone Encounter (Signed)
Copied from [place] [phone]. Topic: General - Other >> Mar 10, 2019  3:24 PM Rainey Pines A wrote: Patient would like a callback in regards to if he can get a 12 led ecg done at the office that was ordered for him. Please advise

## 2019-04-04 HISTORY — PX: OTHER SURGICAL HISTORY: SHX169

## 2019-06-23 ENCOUNTER — Other Ambulatory Visit: Payer: Self-pay

## 2019-06-23 ENCOUNTER — Other Ambulatory Visit (INDEPENDENT_AMBULATORY_CARE_PROVIDER_SITE_OTHER)

## 2019-06-23 DIAGNOSIS — Z1389 Encounter for screening for other disorder: Secondary | ICD-10-CM

## 2019-06-23 DIAGNOSIS — E785 Hyperlipidemia, unspecified: Secondary | ICD-10-CM

## 2019-06-23 DIAGNOSIS — Z1329 Encounter for screening for other suspected endocrine disorder: Secondary | ICD-10-CM

## 2019-06-23 DIAGNOSIS — Z0000 Encounter for general adult medical examination without abnormal findings: Secondary | ICD-10-CM | POA: Diagnosis not present

## 2019-06-23 DIAGNOSIS — E559 Vitamin D deficiency, unspecified: Secondary | ICD-10-CM | POA: Diagnosis not present

## 2019-06-23 DIAGNOSIS — Z125 Encounter for screening for malignant neoplasm of prostate: Secondary | ICD-10-CM | POA: Diagnosis not present

## 2019-06-23 LAB — COMPREHENSIVE METABOLIC PANEL
ALT: 14 U/L (ref 0–53)
AST: 15 U/L (ref 0–37)
Albumin: 4 g/dL (ref 3.5–5.2)
Alkaline Phosphatase: 67 U/L (ref 39–117)
BUN: 5 mg/dL — ABNORMAL LOW (ref 6–23)
CO2: 31 mEq/L (ref 19–32)
Calcium: 8.9 mg/dL (ref 8.4–10.5)
Chloride: 104 mEq/L (ref 96–112)
Creatinine, Ser: 0.69 mg/dL (ref 0.40–1.50)
GFR: 116.08 mL/min (ref >60.00)
Glucose, Bld: 94 mg/dL (ref 70–99)
Potassium: 4.1 mEq/L (ref 3.5–5.1)
Sodium: 141 mEq/L (ref 135–145)
Total Bilirubin: 0.8 mg/dL (ref 0.2–1.2)
Total Protein: 6 g/dL (ref 6.0–8.3)

## 2019-06-23 LAB — CBC WITH DIFFERENTIAL/PLATELET
Basophils Absolute: 0 10*3/uL (ref 0.0–0.1)
Basophils Relative: 0.5 % (ref 0.0–3.0)
Eosinophils Absolute: 0.2 10*3/uL (ref 0.0–0.7)
Eosinophils Relative: 2.8 % (ref 0.0–5.0)
HCT: 43.5 % (ref 39.0–52.0)
Hemoglobin: 14.8 g/dL (ref 13.0–17.0)
Lymphocytes Relative: 30.6 % (ref 12.0–46.0)
Lymphs Abs: 2 10*3/uL (ref 0.7–4.0)
MCHC: 34 g/dL (ref 30.0–36.0)
MCV: 90.7 fl (ref 78.0–100.0)
Monocytes Absolute: 0.7 10*3/uL (ref 0.1–1.0)
Monocytes Relative: 10.4 % (ref 3.0–12.0)
Neutro Abs: 3.7 10*3/uL (ref 1.4–7.7)
Neutrophils Relative %: 55.7 % (ref 43.0–77.0)
Platelets: 208 10*3/uL (ref 150.0–400.0)
RBC: 4.8 Mil/uL (ref 4.22–5.81)
RDW: 14.1 % (ref 11.5–15.5)
WBC: 6.7 10*3/uL (ref 4.0–10.5)

## 2019-06-23 LAB — LIPID PANEL
Cholesterol: 188 mg/dL (ref 0–200)
HDL: 38.8 mg/dL — ABNORMAL LOW (ref >39.00)
LDL Cholesterol: 113 mg/dL — ABNORMAL HIGH (ref 0–99)
NonHDL: 149.54
Total CHOL/HDL Ratio: 5
Triglycerides: 184 mg/dL — ABNORMAL HIGH (ref 0.0–149.0)
VLDL: 36.8 mg/dL (ref 0.0–40.0)

## 2019-06-23 LAB — PSA: PSA: 1.38 ng/mL (ref 0.10–4.00)

## 2019-06-23 LAB — TSH: TSH: 3.38 u[IU]/mL (ref 0.35–4.50)

## 2019-06-23 LAB — VITAMIN D 25 HYDROXY (VIT D DEFICIENCY, FRACTURES): VITD: 45.38 ng/mL (ref 30.00–100.00)

## 2019-06-23 NOTE — Addendum Note (Signed)
Addended by: Bonnell Public I on: 06/23/2019 09:03 AM   Modules accepted: Orders

## 2019-06-23 NOTE — Addendum Note (Signed)
Addended by: Bonnell Public I on: 06/23/2019 10:22 AM   Modules accepted: Orders

## 2019-06-24 LAB — URINALYSIS, ROUTINE W REFLEX MICROSCOPIC
Bacteria, UA: NONE SEEN /HPF
Bilirubin Urine: NEGATIVE
Glucose, UA: NEGATIVE
Hgb urine dipstick: NEGATIVE
Hyaline Cast: NONE SEEN /LPF
Ketones, ur: NEGATIVE
Nitrite: NEGATIVE
Protein, ur: NEGATIVE
RBC / HPF: NONE SEEN /HPF (ref 0–2)
Specific Gravity, Urine: 1.005 (ref 1.001–1.03)
WBC, UA: NONE SEEN /HPF (ref 0–5)
pH: 7.5 (ref 5.0–8.0)

## 2019-06-30 ENCOUNTER — Other Ambulatory Visit: Payer: Self-pay

## 2019-07-02 ENCOUNTER — Encounter: Payer: Self-pay | Admitting: Internal Medicine

## 2019-07-02 ENCOUNTER — Ambulatory Visit (INDEPENDENT_AMBULATORY_CARE_PROVIDER_SITE_OTHER): Admitting: Internal Medicine

## 2019-07-02 ENCOUNTER — Other Ambulatory Visit: Payer: Self-pay

## 2019-07-02 VITALS — BP 106/70 | HR 88 | Temp 97.1°F | Ht 72.0 in | Wt 235.4 lb

## 2019-07-02 DIAGNOSIS — M25512 Pain in left shoulder: Secondary | ICD-10-CM

## 2019-07-02 DIAGNOSIS — Z0000 Encounter for general adult medical examination without abnormal findings: Secondary | ICD-10-CM | POA: Insufficient documentation

## 2019-07-02 DIAGNOSIS — Z1329 Encounter for screening for other suspected endocrine disorder: Secondary | ICD-10-CM

## 2019-07-02 DIAGNOSIS — E785 Hyperlipidemia, unspecified: Secondary | ICD-10-CM | POA: Diagnosis not present

## 2019-07-02 DIAGNOSIS — G8929 Other chronic pain: Secondary | ICD-10-CM

## 2019-07-02 DIAGNOSIS — Z125 Encounter for screening for malignant neoplasm of prostate: Secondary | ICD-10-CM

## 2019-07-02 DIAGNOSIS — E559 Vitamin D deficiency, unspecified: Secondary | ICD-10-CM

## 2019-07-02 DIAGNOSIS — Z1389 Encounter for screening for other disorder: Secondary | ICD-10-CM

## 2019-07-02 NOTE — Progress Notes (Addendum)
Chief Complaint  Patient presents with  . Annual Exam  . Bursitis    pt suspects bursitis in his left shoulder. Ongoing for aboput a year now. No pain today.    Annual  1. pAF was on flecainide now off and on toprol xl 25 mg qd chasvasc 0 no anticoagulation per [facility] and will do [facility] virtual appt pending  [facility] cards appt 12/2019  2. Left upper arm cyst x 36 years not currently inflamed will message back if wants to see derm for removal  3. Left shoulder pain since 05/2019 and pain with lateral outward movement taking advil 200 mg qid then tid and pain with ROM anterior shoulder pain 4/10    Review of Systems  Constitutional: Negative for weight loss.  HENT: Negative for hearing loss.   Eyes: Negative for blurred vision.  Respiratory: Negative for shortness of breath.   Cardiovascular: Negative for chest pain and palpitations.  Gastrointestinal: Negative for abdominal pain and blood in stool.  Musculoskeletal: Negative for falls.  Skin: Negative for rash.  Neurological: Negative for headaches.  Psychiatric/Behavioral: Negative for depression.   Past Medical History:  Diagnosis Date  . HLD (hyperlipidemia)    Past Surgical History:  Procedure Laterality Date  . CHOLECYSTECTOMY     Family History  Problem Relation Age of Onset  . COPD Mother   . Cancer Father        lung cancer smoker age 63   . Heart disease Father        bypass at 39    Social History   Socioeconomic History  . Marital status: Married    Spouse name: Not on file  . Number of children: Not on file  . Years of education: Not on file  . Highest education level: Not on file  Occupational History  . Not on file  Tobacco Use  . Smoking status: Never Smoker  . Smokeless tobacco: Never Used  Substance and Sexual Activity  . Alcohol use: Never  . Drug use: Not Currently  . Sexual activity: Not Currently  Other Topics Concern  . Not on file  Social History Narrative    2 sons and 1 daughter    Married    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    No guns, wears seat belt, safe in relationship   Social Determinants of Corporate investment banker Strain:   . Difficulty of Paying Living Expenses:   Food Insecurity:   . Worried About Programme researcher, broadcasting/film/video in the Last Year:   . Barista in the Last Year:   Transportation Needs:   . Freight forwarder (Medical):   Marland Kitchen Lack of Transportation (Non-Medical):   Physical Activity:   . Days of Exercise per Week:   . Minutes of Exercise per Session:   Stress:   . Feeling of Stress :   Social Connections:   . Frequency of Communication with Friends and Family:   . Frequency of Social Gatherings with Friends and Family:   . Attends Religious Services:   . Active Member of Clubs or Organizations:   . Attends Banker Meetings:   Marland Kitchen Marital Status:   Intimate Partner Violence:   . Fear of Current or Ex-Partner:   . Emotionally Abused:   Marland Kitchen Physically Abused:   . Sexually Abused:    Current Meds  Medication Sig  . CALCIUM-MAGNESUIUM-ZINC  333-133-8.3 MG TABS 1 capsule once daily  . Cholecalciferol 25 MCG (1000 UT) tablet Take by mouth.  . cyanocobalamin 1000 MCG tablet Take by mouth.  . ERGOCALCIFEROL PO Take 25 mcg by mouth.  . estradiol (VIVELLE-DOT) 0.1 MG/24HR patch Place 2 patches onto the skin. Q3.5 days  . Ibuprofen 200 MG CAPS 1 capsule 4 (four) times daily as needed Takes for bursitis.  . Iodine Strong, Lugols, (STRONG IODINE) 5 % solution Take by mouth.  . IODINE, KELP, PO   . metoprolol succinate (TOPROL-XL) 25 MG 24 hr tablet Take by mouth.  . Multiple Vitamins-Minerals (ZINC PO)   . Omega-3 Fatty Acids (OMEGA-3 FISH OIL PO) 1 capsule once daily   No Known Allergies Recent Results (from the past 2160 hour(s))  Vitamin D (25 hydroxy)     Status: None   Collection Time: 06/23/19  8:01 AM  Result Value Ref Range   VITD 45.38 30.00 - 100.00 ng/mL  PSA     Status:  None   Collection Time: 06/23/19  8:01 AM  Result Value Ref Range   PSA 1.38 0.10 - 4.00 ng/mL    Comment: Test performed using Access Hybritech PSA Assay, a parmagnetic partical, chemiluminecent immunoassay.  TSH     Status: None   Collection Time: 06/23/19  8:01 AM  Result Value Ref Range   TSH 3.38 0.35 - 4.50 uIU/mL  CBC w/Diff     Status: None   Collection Time: 06/23/19  8:01 AM  Result Value Ref Range   WBC 6.7 4.0 - 10.5 K/uL   RBC 4.80 4.22 - 5.81 Mil/uL   Hemoglobin 14.8 13.0 - 17.0 g/dL   HCT 10.1 75.1 - 02.5 %   MCV 90.7 78.0 - 100.0 fl   MCHC 34.0 30.0 - 36.0 g/dL   RDW 85.2 77.8 - 24.2 %   Platelets 208.0 150.0 - 400.0 K/uL   Neutrophils Relative % 55.7 43.0 - 77.0 %   Lymphocytes Relative 30.6 12.0 - 46.0 %   Monocytes Relative 10.4 3.0 - 12.0 %   Eosinophils Relative 2.8 0.0 - 5.0 %   Basophils Relative 0.5 0.0 - 3.0 %   Neutro Abs 3.7 1.4 - 7.7 K/uL   Lymphs Abs 2.0 0.7 - 4.0 K/uL   Monocytes Absolute 0.7 0.1 - 1.0 K/uL   Eosinophils Absolute 0.2 0.0 - 0.7 K/uL   Basophils Absolute 0.0 0.0 - 0.1 K/uL  Comprehensive metabolic panel     Status: Abnormal   Collection Time: 06/23/19  8:01 AM  Result Value Ref Range   Sodium 141 135 - 145 mEq/L   Potassium 4.1 3.5 - 5.1 mEq/L   Chloride 104 96 - 112 mEq/L   CO2 31 19 - 32 mEq/L   Glucose, Bld 94 70 - 99 mg/dL   BUN 5 (L) 6 - 23 mg/dL   Creatinine, Ser 3.53 0.40 - 1.50 mg/dL   Total Bilirubin 0.8 0.2 - 1.2 mg/dL   Alkaline Phosphatase 67 39 - 117 U/L   AST 15 0 - 37 U/L   ALT 14 0 - 53 U/L   Total Protein 6.0 6.0 - 8.3 g/dL   Albumin 4.0 3.5 - 5.2 g/dL   GFR 614.43 >15.40 mL/min   Calcium 8.9 8.4 - 10.5 mg/dL  Lipid panel     Status: Abnormal   Collection Time: 06/23/19  8:01 AM  Result Value Ref Range   Cholesterol 188 0 - 200 mg/dL    Comment: ATP III Classification  Desirable:  < 200 mg/dL               Borderline High:  200 - 239 mg/dL          High:  > = 240 mg/dL   Triglycerides 184.0 (H) 0.0  - 149.0 mg/dL    Comment: Normal:  <150 mg/dLBorderline High:  150 - 199 mg/dL   HDL 38.80 (L) >39.00 mg/dL   VLDL 36.8 0.0 - 40.0 mg/dL   LDL Cholesterol 113 (H) 0 - 99 mg/dL   Total CHOL/HDL Ratio 5     Comment:                Men          Women1/2 Average Risk     3.4          3.3Average Risk          5.0          4.42X Average Risk          9.6          7.13X Average Risk          15.0          11.0                       NonHDL 149.54     Comment: NOTE:  Non-HDL goal should be 30 mg/dL higher than patient's LDL goal (i.e. LDL goal of < 70 mg/dL, would have non-HDL goal of < 100 mg/dL)  Urinalysis, Routine w reflex microscopic     Status: Abnormal   Collection Time: 06/23/19 10:22 AM  Result Value Ref Range   Color, Urine YELLOW YELLOW   APPearance CLEAR CLEAR   Specific Gravity, Urine 1.005 1.001 - 1.03   pH 7.5 5.0 - 8.0   Glucose, UA NEGATIVE NEGATIVE   Bilirubin Urine NEGATIVE NEGATIVE   Ketones, ur NEGATIVE NEGATIVE   Hgb urine dipstick NEGATIVE NEGATIVE   Protein, ur NEGATIVE NEGATIVE   Nitrite NEGATIVE NEGATIVE   Leukocytes,Ua TRACE (A) NEGATIVE   WBC, UA NONE SEEN 0 - 5 /HPF   RBC / HPF NONE SEEN 0 - 2 /HPF   Squamous Epithelial / LPF 0-5 < OR = 5 /HPF   Bacteria, UA NONE SEEN NONE SEEN /HPF   Hyaline Cast NONE SEEN NONE SEEN /LPF   Objective  Body mass index is 31.93 kg/m. Wt Readings from Last 3 Encounters:  07/02/19 235 lb 6.4 oz (106.8 kg)  12/26/18 235 lb (106.6 kg)  06/21/18 235 lb 6.4 oz (106.8 kg)   Temp Readings from Last 3 Encounters:  07/02/19 (!) 97.1 F (36.2 C) (Temporal)  06/21/18 98.2 F (36.8 C) (Other (Comment))   BP Readings from Last 3 Encounters:  07/02/19 106/70  12/26/18 104/66  06/21/18 100/70   Pulse Readings from Last 3 Encounters:  07/02/19 88  12/26/18 (!) 58  06/21/18 84    Physical Exam Vitals and nursing note reviewed.  Constitutional:      Appearance: Normal appearance. He is well-developed and well-groomed.  HENT:      Head: Normocephalic and atraumatic.     Comments: Left ear wax >right impacted   Eyes:     Conjunctiva/sclera: Conjunctivae normal.     Pupils: Pupils are equal, round, and reactive to light.  Cardiovascular:     Rate and Rhythm: Normal rate. Rhythm irregular.     Heart sounds: No murmur.  Comments: In Afib Pulmonary:     Effort: Pulmonary effort is normal.     Breath sounds: Normal breath sounds.  Abdominal:     General: Abdomen is flat. Bowel sounds are normal.     Palpations: Abdomen is soft.     Tenderness: There is no abdominal tenderness.  Musculoskeletal:     Right lower leg: No edema.     Left lower leg: No edema.  Skin:    General: Skin is warm and dry.       Neurological:     General: No focal deficit present.     Mental Status: He is alert and oriented to person, place, and time. Mental status is at baseline.     Gait: Gait normal.  Psychiatric:        Attention and Perception: Attention and perception normal.        Mood and Affect: Mood and affect normal.        Speech: Speech normal.        Behavior: Behavior is cooperative.        Thought Content: Thought content normal.        Cognition and Memory: Cognition and memory normal.        Judgment: Judgment normal.     Assessment  Plan  Annual physical exam Declines flu shot  Tdap utd  Hep C negative  covid 19 06/13/19 J&J  X 1   Check fasting labs 06/23/19 Never smoker FH lung cancer in dad  PSA 2.21 06/21/18  PSA normal 06/2019 declines DRE today Declines colonoscopy  sent cologuard Derm no needs for now call back if wants referral  rec healthy diet and exercise   Chronic left shoulder pain - Plan: Ambulatory referral to Orthopedic Surgery [facility] seen 4/14/21in [place] Dr. 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 French Ana McLean-Scocuzza-Internal Medicine

## 2019-07-02 NOTE — Patient Instructions (Addendum)
12/31/2019 Office Visit Cardiology Piccini, Clarice Pole, MD  [facility] 24F[address]2G  [place], [place] [address]  [phone]  [phone] (Fax)        Debrox ear wax drops   voltaren gel up to 4x per day  Shoulder Exercises Ask your health care provider which exercises are safe for you. Do exercises exactly as told by your health care provider and adjust them as directed. It is normal to feel mild stretching, pulling, tightness, or discomfort as you do these exercises. Stop right away if you feel sudden pain or your pain gets worse. Do not begin these exercises until told by your health care provider. Stretching exercises External rotation and abduction This exercise is sometimes called corner stretch. This exercise rotates your arm outward (external rotation) and moves your arm out from your body (abduction). 1. Stand in a doorway with one of your feet slightly in front of the other. This is called a staggered stance. If you cannot reach your forearms to the door frame, stand facing a corner of a room. 2. Choose one of the following positions as told by your health care provider: ? Place your hands and forearms on the door frame above your head. ? Place your hands and forearms on the door frame at the height of your head. ? Place your hands on the door frame at the height of your elbows. 3. Slowly move your weight onto your front foot until you feel a stretch across your chest and in the front of your shoulders. Keep your head and chest upright and keep your abdominal muscles tight. 4. Hold for __________ seconds. 5. To release the stretch, shift your weight to your back foot. Repeat __________ times. Complete this exercise __________ times a day. Extension, standing 1. Stand and hold a broomstick, a cane, or a similar object behind your back. ? Your hands should be a little wider than shoulder width apart. ? Your palms should face away from your back. 2. Keeping  your elbows straight and your shoulder muscles relaxed, move the stick away from your body until you feel a stretch in your shoulders (extension). ? Avoid shrugging your shoulders while you move the stick. Keep your shoulder blades tucked down toward the middle of your back. 3. Hold for __________ seconds. 4. Slowly return to the starting position. Repeat __________ times. Complete this exercise __________ times a day. Range-of-motion exercises Pendulum  1. Stand near a wall or a surface that you can hold onto for balance. 2. Bend at the waist and let your left / right arm hang straight down. Use your other arm to support you. Keep your back straight and do not lock your knees. 3. Relax your left / right arm and shoulder muscles, and move your hips and your trunk so your left / right arm swings freely. Your arm should swing because of the motion of your body, not because you are using your arm or shoulder muscles. 4. Keep moving your hips and trunk so your arm swings in the following directions, as told by your health care provider: ? Side to side. ? Forward and backward. ? In clockwise and counterclockwise circles. 5. Continue each motion for __________ seconds, or for as long as told by your health care provider. 6. Slowly return to the starting position. Repeat __________ times. Complete this exercise __________ times a day. Shoulder flexion, standing  1. Stand and hold a broomstick, a cane, or a similar object. Place your hands a  little more than shoulder width apart on the object. Your left / right hand should be palm up, and your other hand should be palm down. 2. Keep your elbow straight and your shoulder muscles relaxed. Push the stick up with your healthy arm to raise your left / right arm in front of your body, and then over your head until you feel a stretch in your shoulder (flexion). ? Avoid shrugging your shoulder while you raise your arm. Keep your shoulder blade tucked down  toward the middle of your back. 3. Hold for __________ seconds. 4. Slowly return to the starting position. Repeat __________ times. Complete this exercise __________ times a day. Shoulder abduction, standing 1. Stand and hold a broomstick, a cane, or a similar object. Place your hands a little more than shoulder width apart on the object. Your left / right hand should be palm up, and your other hand should be palm down. 2. Keep your elbow straight and your shoulder muscles relaxed. Push the object across your body toward your left / right side. Raise your left / right arm to the side of your body (abduction) until you feel a stretch in your shoulder. ? Do not raise your arm above shoulder height unless your health care provider tells you to do that. ? If directed, raise your arm over your head. ? Avoid shrugging your shoulder while you raise your arm. Keep your shoulder blade tucked down toward the middle of your back. 3. Hold for __________ seconds. 4. Slowly return to the starting position. Repeat __________ times. Complete this exercise __________ times a day. Internal rotation  1. Place your left / right hand behind your back, palm up. 2. Use your other hand to dangle an exercise band, a towel, or a similar object over your shoulder. Grasp the band with your left / right hand so you are holding on to both ends. 3. Gently pull up on the band until you feel a stretch in the front of your left / right shoulder. The movement of your arm toward the center of your body is called internal rotation. ? Avoid shrugging your shoulder while you raise your arm. Keep your shoulder blade tucked down toward the middle of your back. 4. Hold for __________ seconds. 5. Release the stretch by letting go of the band and lowering your hands. Repeat __________ times. Complete this exercise __________ times a day. Strengthening exercises External rotation  1. Sit in a stable chair without armrests. 2. Secure  an exercise band to a stable object at elbow height on your left / right side. 3. Place a soft object, such as a folded towel or a small pillow, between your left / right upper arm and your body to move your elbow about 4 inches (10 cm) away from your side. 4. Hold the end of the exercise band so it is tight and there is no slack. 5. Keeping your elbow pressed against the soft object, slowly move your forearm out, away from your abdomen (external rotation). Keep your body steady so only your forearm moves. 6. Hold for __________ seconds. 7. Slowly return to the starting position. Repeat __________ times. Complete this exercise __________ times a day. Shoulder abduction  1. Sit in a stable chair without armrests, or stand up. 2. Hold a __________ weight in your left / right hand, or hold an exercise band with both hands. 3. Start with your arms straight down and your left / right palm facing in, toward your  body. 4. Slowly lift your left / right hand out to your side (abduction). Do not lift your hand above shoulder height unless your health care provider tells you that this is safe. ? Keep your arms straight. ? Avoid shrugging your shoulder while you do this movement. Keep your shoulder blade tucked down toward the middle of your back. 5. Hold for __________ seconds. 6. Slowly lower your arm, and return to the starting position. Repeat __________ times. Complete this exercise __________ times a day. Shoulder extension 1. Sit in a stable chair without armrests, or stand up. 2. Secure an exercise band to a stable object in front of you so it is at shoulder height. 3. Hold one end of the exercise band in each hand. Your palms should face each other. 4. Straighten your elbows and lift your hands up to shoulder height. 5. Step back, away from the secured end of the exercise band, until the band is tight and there is no slack. 6. Squeeze your shoulder blades together as you pull your hands down to  the sides of your thighs (extension). Stop when your hands are straight down by your sides. Do not let your hands go behind your body. 7. Hold for __________ seconds. 8. Slowly return to the starting position. Repeat __________ times. Complete this exercise __________ times a day. Shoulder row 1. Sit in a stable chair without armrests, or stand up. 2. Secure an exercise band to a stable object in front of you so it is at waist height. 3. Hold one end of the exercise band in each hand. Position your palms so that your thumbs are facing the ceiling (neutral position). 4. Bend each of your elbows to a 90-degree angle (right angle) and keep your upper arms at your sides. 5. Step back until the band is tight and there is no slack. 6. Slowly pull your elbows back behind you. 7. Hold for __________ seconds. 8. Slowly return to the starting position. Repeat __________ times. Complete this exercise __________ times a day. Shoulder press-ups  1. Sit in a stable chair that has armrests. Sit upright, with your feet flat on the floor. 2. Put your hands on the armrests so your elbows are bent and your fingers are pointing forward. Your hands should be about even with the sides of your body. 3. Push down on the armrests and use your arms to lift yourself off the chair. Straighten your elbows and lift yourself up as much as you comfortably can. ? Move your shoulder blades down, and avoid letting your shoulders move up toward your ears. ? Keep your feet on the ground. As you get stronger, your feet should support less of your body weight as you lift yourself up. 4. Hold for __________ seconds. 5. Slowly lower yourself back into the chair. Repeat __________ times. Complete this exercise __________ times a day. Wall push-ups  1. Stand so you are facing a stable wall. Your feet should be about one arm-length away from the wall. 2. Lean forward and place your palms on the wall at shoulder height. 3. Keep your  feet flat on the floor as you bend your elbows and lean forward toward the wall. 4. Hold for __________ seconds. 5. Straighten your elbows to push yourself back to the starting position. Repeat __________ times. Complete this exercise __________ times a day. This information is not intended to replace advice given to you by your health care provider. Make sure you discuss any questions you have  with your health care provider. Document Revised: 07/12/2018 Document Reviewed: 04/19/2018 Elsevier Patient Education  2020 [employer]     High Cholesterol  High cholesterol is a condition in which the blood has high levels of a white, waxy, fat-like substance (cholesterol). The human body needs small amounts of cholesterol. The liver makes all the cholesterol that the body needs. Extra (excess) cholesterol comes from the food that we eat. Cholesterol is carried from the liver by the blood through the blood vessels. If you have high cholesterol, deposits (plaques) may build up on the walls of your blood vessels (arteries). Plaques make the arteries narrower and stiffer. Cholesterol plaques increase your risk for heart attack and stroke. Work with your health care provider to keep your cholesterol levels in a healthy range. What increases the risk? This condition is more likely to develop in people who:  Eat foods that are high in animal fat (saturated fat) or cholesterol.  Are overweight.  Are not getting enough exercise.  Have a family history of high cholesterol. What are the signs or symptoms? There are no symptoms of this condition. How is this diagnosed? This condition may be diagnosed from the results of a blood test.  If you are older than age 63, your health care provider may check your cholesterol every 4-6 years.  You may be checked more often if you already have high cholesterol or other risk factors for heart disease. The blood test for cholesterol measures:  "Bad"  cholesterol (LDL cholesterol). This is the main type of cholesterol that causes heart disease. The desired level for LDL is less than 100.  "Good" cholesterol (HDL cholesterol). This type helps to protect against heart disease by cleaning the arteries and carrying the LDL away. The desired level for HDL is 60 or higher.  Triglycerides. These are fats that the body can store or burn for energy. The desired number for triglycerides is lower than 150.  Total cholesterol. This is a measure of the total amount of cholesterol in your blood, including LDL cholesterol, HDL cholesterol, and triglycerides. A healthy number is less than 200. How is this treated? This condition is treated with diet changes, lifestyle changes, and medicines. Diet changes  This may include eating more whole grains, fruits, vegetables, nuts, and fish.  This may also include cutting back on red meat and foods that have a lot of added sugar. Lifestyle changes  Changes may include getting at least 40 minutes of aerobic exercise 3 times a week. Aerobic exercises include walking, biking, and swimming. Aerobic exercise along with a healthy diet can help you maintain a healthy weight.  Changes may also include quitting smoking. Medicines  Medicines are usually given if diet and lifestyle changes have failed to reduce your cholesterol to healthy levels.  Your health care provider may prescribe a statin medicine. Statin medicines have been shown to reduce cholesterol, which can reduce the risk of heart disease. Follow these instructions at home: Eating and drinking If told by your health care provider:  Eat chicken (without skin), fish, veal, shellfish, ground [place] breast, and round or loin cuts of red meat.  Do not eat fried foods or fatty meats, such as hot dogs and salami.  Eat plenty of fruits, such as apples.  Eat plenty of vegetables, such as broccoli, potatoes, and carrots.  Eat beans, peas, and lentils.  Eat  grains such as barley, rice, couscous, and bulgur wheat.  Eat pasta without cream sauces.  Use skim or  nonfat milk, and eat low-fat or nonfat yogurt and cheeses.  Do not eat or drink whole milk, cream, ice cream, egg yolks, or hard cheeses.  Do not eat stick margarine or tub margarines that contain trans fats (also called partially hydrogenated oils).  Do not eat saturated tropical oils, such as coconut oil and palm oil.  Do not eat cakes, cookies, crackers, or other baked goods that contain trans fats.  General instructions  Exercise as directed by your health care provider. Increase your activity level with activities such as gardening, walking, and taking the stairs.  Take over-the-counter and prescription medicines only as told by your health care provider.  Do not use any products that contain nicotine or tobacco, such as cigarettes and e-cigarettes. If you need help quitting, ask your health care provider.  Keep all follow-up visits as told by your health care provider. This is important. Contact a health care provider if:  You are struggling to maintain a healthy diet or weight.  You need help to start on an exercise program.  You need help to stop smoking. Get help right away if:  You have chest pain.  You have trouble breathing. This information is not intended to replace advice given to you by your health care provider. Make sure you discuss any questions you have with your health care provider. Document Revised: 03/23/2017 Document Reviewed: 09/18/2015 Elsevier Patient Education  2020 [employer].  Cholesterol Content in Foods Cholesterol is a waxy, fat-like substance that helps to carry fat in the blood. The body needs cholesterol in small amounts, but too much cholesterol can cause damage to the arteries and heart. Most people should eat less than 200 milligrams (mg) of cholesterol a day. Foods with cholesterol  Cholesterol is found in animal-based foods,  such as meat, seafood, and dairy. Generally, low-fat dairy and lean meats have less cholesterol than full-fat dairy and fatty meats. The milligrams of cholesterol per serving (mg per serving) of common cholesterol-containing foods are listed below. Meat and other proteins  Egg -- one large whole egg has 186 mg.  Veal shank -- 4 oz has 141 mg.  Lean ground [place] (93% lean) -- 4 oz has 118 mg.  Fat-trimmed lamb loin -- 4 oz has 106 mg.  Lean ground beef (90% lean) -- 4 oz has 100 mg.  Lobster -- 3.5 oz has 90 mg.  Pork loin chops -- 4 oz has 86 mg.  Canned salmon -- 3.5 oz has 83 mg.  Fat-trimmed beef top loin -- 4 oz has 78 mg.  Frankfurter -- 1 frank (3.5 oz) has 77 mg.  Crab -- 3.5 oz has 71 mg.  Roasted chicken without skin, white meat -- 4 oz has 66 mg.  Light bologna -- 2 oz has 45 mg.  Deli-cut [place] -- 2 oz has 31 mg.  Canned tuna -- 3.5 oz has 31 mg.  Tomasa Blase -- 1 oz has 29 mg.  Oysters and mussels (raw) -- 3.5 oz has 25 mg.  Mackerel -- 1 oz has 22 mg.  Trout -- 1 oz has 20 mg.  Pork sausage -- 1 link (1 oz) has 17 mg.  Salmon -- 1 oz has 16 mg.  Tilapia -- 1 oz has 14 mg. Dairy  Soft-serve ice cream --  cup (4 oz) has 103 mg.  Whole-milk yogurt -- 1 cup (8 oz) has 29 mg.  Cheddar cheese -- 1 oz has 28 mg.  American cheese -- 1 oz has 28  mg.  Whole milk -- 1 cup (8 oz) has 23 mg.  2% milk -- 1 cup (8 oz) has 18 mg.  Cream cheese -- 1 tablespoon (Tbsp) has 15 mg.  Cottage cheese --  cup (4 oz) has 14 mg.  Low-fat (1%) milk -- 1 cup (8 oz) has 10 mg.  Sour cream -- 1 Tbsp has 8.5 mg.  Low-fat yogurt -- 1 cup (8 oz) has 8 mg.  Nonfat Greek yogurt -- 1 cup (8 oz) has 7 mg.  Half-and-half cream -- 1 Tbsp has 5 mg. Fats and oils  Cod liver oil -- 1 tablespoon (Tbsp) has 82 mg.  Butter -- 1 Tbsp has 15 mg.  Lard -- 1 Tbsp has 14 mg.  Bacon grease -- 1 Tbsp has 14 mg.  Mayonnaise -- 1 Tbsp has 5-10 mg.  Margarine -- 1 Tbsp has  3-10 mg. Exact amounts of cholesterol in these foods may vary depending on specific ingredients and brands. Foods without cholesterol Most plant-based foods do not have cholesterol unless you combine them with a food that has cholesterol. Foods without cholesterol include:  Grains and cereals.  Vegetables.  Fruits.  Vegetable oils, such as olive, canola, and sunflower oil.  Legumes, such as peas, beans, and lentils.  Nuts and seeds.  Egg whites. Summary  The body needs cholesterol in small amounts, but too much cholesterol can cause damage to the arteries and heart.  Most people should eat less than 200 milligrams (mg) of cholesterol a day. This information is not intended to replace advice given to you by your health care provider. Make sure you discuss any questions you have with your health care provider. Document Revised: 03/02/2017 Document Reviewed: 11/14/2016 Elsevier Patient Education  2020 [employer].

## 2019-07-16 DIAGNOSIS — I4819 Other persistent atrial fibrillation: Secondary | ICD-10-CM | POA: Insufficient documentation

## 2019-08-04 LAB — COLOGUARD: Cologuard: NEGATIVE

## 2019-08-08 LAB — COLOGUARD: COLOGUARD: NEGATIVE

## 2019-08-08 LAB — EXTERNAL GENERIC LAB PROCEDURE: COLOGUARD: NEGATIVE

## 2019-08-12 ENCOUNTER — Telehealth: Payer: Self-pay | Admitting: Internal Medicine

## 2019-08-12 ENCOUNTER — Encounter: Payer: Self-pay | Admitting: Internal Medicine

## 2019-08-12 NOTE — Telephone Encounter (Signed)
08/04/19 cologuard test negative repeat in 3 years   TMS

## 2019-08-12 NOTE — Telephone Encounter (Signed)
See other encounter  TMS

## 2019-08-13 NOTE — Telephone Encounter (Signed)
Patient informed and verbalized understanding

## 2019-08-26 ENCOUNTER — Encounter: Payer: Self-pay | Admitting: Internal Medicine

## 2019-09-15 DIAGNOSIS — Z79899 Other long term (current) drug therapy: Secondary | ICD-10-CM | POA: Insufficient documentation

## 2019-09-15 DIAGNOSIS — Z8679 Personal history of other diseases of the circulatory system: Secondary | ICD-10-CM | POA: Insufficient documentation

## 2019-09-15 HISTORY — DX: Other long term (current) drug therapy: Z79.899

## 2020-06-22 ENCOUNTER — Encounter: Payer: Self-pay | Admitting: Internal Medicine

## 2020-07-01 ENCOUNTER — Other Ambulatory Visit (INDEPENDENT_AMBULATORY_CARE_PROVIDER_SITE_OTHER)

## 2020-07-01 ENCOUNTER — Other Ambulatory Visit: Payer: Self-pay

## 2020-07-01 DIAGNOSIS — Z125 Encounter for screening for malignant neoplasm of prostate: Secondary | ICD-10-CM | POA: Diagnosis not present

## 2020-07-01 DIAGNOSIS — Z1329 Encounter for screening for other suspected endocrine disorder: Secondary | ICD-10-CM | POA: Diagnosis not present

## 2020-07-01 DIAGNOSIS — Z0000 Encounter for general adult medical examination without abnormal findings: Secondary | ICD-10-CM | POA: Diagnosis not present

## 2020-07-01 DIAGNOSIS — E785 Hyperlipidemia, unspecified: Secondary | ICD-10-CM

## 2020-07-01 DIAGNOSIS — Z1389 Encounter for screening for other disorder: Secondary | ICD-10-CM

## 2020-07-01 DIAGNOSIS — E559 Vitamin D deficiency, unspecified: Secondary | ICD-10-CM | POA: Diagnosis not present

## 2020-07-01 LAB — CBC WITH DIFFERENTIAL/PLATELET
Basophils Absolute: 0 10*3/uL (ref 0.0–0.1)
Basophils Relative: 0.6 % (ref 0.0–3.0)
Eosinophils Absolute: 0.2 10*3/uL (ref 0.0–0.7)
Eosinophils Relative: 2.3 % (ref 0.0–5.0)
HCT: 40 % (ref 39.0–52.0)
Hemoglobin: 13.4 g/dL (ref 13.0–17.0)
Lymphocytes Relative: 24.9 % (ref 12.0–46.0)
Lymphs Abs: 1.8 10*3/uL (ref 0.7–4.0)
MCHC: 33.4 g/dL (ref 30.0–36.0)
MCV: 90.2 fl (ref 78.0–100.0)
Monocytes Absolute: 0.7 10*3/uL (ref 0.1–1.0)
Monocytes Relative: 9.9 % (ref 3.0–12.0)
Neutro Abs: 4.5 10*3/uL (ref 1.4–7.7)
Neutrophils Relative %: 62.3 % (ref 43.0–77.0)
Platelets: 167 10*3/uL (ref 150.0–400.0)
RBC: 4.44 Mil/uL (ref 4.22–5.81)
RDW: 13.5 % (ref 11.5–15.5)
WBC: 7.3 10*3/uL (ref 4.0–10.5)

## 2020-07-01 LAB — LIPID PANEL
Cholesterol: 188 mg/dL (ref 0–200)
HDL: 39.5 mg/dL (ref >39.00)
LDL Cholesterol: 112 mg/dL — ABNORMAL HIGH (ref 0–99)
NonHDL: 148.22
Total CHOL/HDL Ratio: 5
Triglycerides: 179 mg/dL — ABNORMAL HIGH (ref 0.0–149.0)
VLDL: 35.8 mg/dL (ref 0.0–40.0)

## 2020-07-01 LAB — COMPREHENSIVE METABOLIC PANEL
ALT: 12 U/L (ref 0–53)
AST: 13 U/L (ref 0–37)
Albumin: 3.8 g/dL (ref 3.5–5.2)
Alkaline Phosphatase: 64 U/L (ref 39–117)
BUN: 3 mg/dL — ABNORMAL LOW (ref 6–23)
CO2: 30 mEq/L (ref 19–32)
Calcium: 8.4 mg/dL (ref 8.4–10.5)
Chloride: 103 mEq/L (ref 96–112)
Creatinine, Ser: 0.67 mg/dL (ref 0.40–1.50)
GFR: 99.51 mL/min (ref >60.00)
Glucose, Bld: 89 mg/dL (ref 70–99)
Potassium: 3.7 mEq/L (ref 3.5–5.1)
Sodium: 138 mEq/L (ref 135–145)
Total Bilirubin: 0.5 mg/dL (ref 0.2–1.2)
Total Protein: 5.5 g/dL — ABNORMAL LOW (ref 6.0–8.3)

## 2020-07-01 LAB — VITAMIN D 25 HYDROXY (VIT D DEFICIENCY, FRACTURES): VITD: 27.69 ng/mL — ABNORMAL LOW (ref 30.00–100.00)

## 2020-07-01 LAB — PSA: PSA: 0.78 ng/mL (ref 0.10–4.00)

## 2020-07-01 LAB — TSH: TSH: 2.64 u[IU]/mL (ref 0.35–4.50)

## 2020-07-02 LAB — URINALYSIS, ROUTINE W REFLEX MICROSCOPIC
Bilirubin Urine: NEGATIVE
Glucose, UA: NEGATIVE
Hgb urine dipstick: NEGATIVE
Ketones, ur: NEGATIVE
Leukocytes,Ua: NEGATIVE
Nitrite: NEGATIVE
Protein, ur: NEGATIVE
Specific Gravity, Urine: 1.008 (ref 1.001–1.03)
pH: 5.5 (ref 5.0–8.0)

## 2020-07-06 ENCOUNTER — Encounter: Payer: Self-pay | Admitting: Internal Medicine

## 2020-07-06 ENCOUNTER — Other Ambulatory Visit: Payer: Self-pay

## 2020-07-06 ENCOUNTER — Ambulatory Visit (INDEPENDENT_AMBULATORY_CARE_PROVIDER_SITE_OTHER): Admitting: Internal Medicine

## 2020-07-06 VITALS — BP 110/78 | HR 76 | Temp 98.1°F | Ht 72.0 in | Wt 233.5 lb

## 2020-07-06 DIAGNOSIS — L910 Hypertrophic scar: Secondary | ICD-10-CM

## 2020-07-06 DIAGNOSIS — Z0000 Encounter for general adult medical examination without abnormal findings: Secondary | ICD-10-CM | POA: Diagnosis not present

## 2020-07-06 DIAGNOSIS — Z8679 Personal history of other diseases of the circulatory system: Secondary | ICD-10-CM | POA: Insufficient documentation

## 2020-07-06 DIAGNOSIS — E785 Hyperlipidemia, unspecified: Secondary | ICD-10-CM | POA: Diagnosis not present

## 2020-07-06 DIAGNOSIS — Z79899 Other long term (current) drug therapy: Secondary | ICD-10-CM

## 2020-07-06 DIAGNOSIS — E538 Deficiency of other specified B group vitamins: Secondary | ICD-10-CM

## 2020-07-06 DIAGNOSIS — Z125 Encounter for screening for malignant neoplasm of prostate: Secondary | ICD-10-CM

## 2020-07-06 DIAGNOSIS — F640 Transsexualism: Secondary | ICD-10-CM

## 2020-07-06 DIAGNOSIS — E291 Testicular hypofunction: Secondary | ICD-10-CM

## 2020-07-06 DIAGNOSIS — Z1389 Encounter for screening for other disorder: Secondary | ICD-10-CM

## 2020-07-06 DIAGNOSIS — E559 Vitamin D deficiency, unspecified: Secondary | ICD-10-CM

## 2020-07-06 DIAGNOSIS — Z1329 Encounter for screening for other suspected endocrine disorder: Secondary | ICD-10-CM

## 2020-07-06 MED ORDER — CLOBETASOL PROPIONATE 0.05 % EX CREA: 1.0000 "application " | TOPICAL_CREAM | Freq: Two times a day (BID) | CUTANEOUS | 0 refills | Status: DC

## 2020-07-06 NOTE — Progress Notes (Signed)
Chief Complaint  Patient presents with  . Annual Exam   Annual  Afib s/p procedure in 2021 with cardiologist [place] in [place] doing well after procedure he had Aflutter but no Afib since 02/2020 or 03/2020 he will have f/u 08/2020 with cardiology He is off all meds and stopped amiodarone in 02/2020 doing well   07/01/20 reviewed labs he is vegan   Declines colonoscopy and DRE  Keloid left chest where loop recorder is which is itchy    Review of Systems  Constitutional: Negative for weight loss.  HENT: Negative for hearing loss.   Eyes: Negative for blurred vision.  Respiratory: Negative for shortness of breath.   Cardiovascular: Negative for chest pain.  Gastrointestinal: Negative for abdominal pain.  Musculoskeletal: Negative for falls and joint pain.  Skin: Negative for rash.  Neurological: Negative for headaches.  Psychiatric/Behavioral: Negative for depression.   Past Medical History:  Diagnosis Date  . Atrial fibrillation (HCC)    had loop recorder   . Atrial flutter (HCC)   . HLD (hyperlipidemia)    Past Surgical History:  Procedure Laterality Date  . CARDIOVERSION     2021 for Afib in [place]  . CHOLECYSTECTOMY     Family History  Problem Relation Age of Onset  . COPD Mother   . Atrial fibrillation Mother        ? due to Afib died [id number]  . Cancer Father        lung cancer smoker age 64   . Heart disease Father        bypass at 5253   . Atrial fibrillation Sister    Social History   Socioeconomic History  . Marital status: Married    Spouse name: Not on file  . Number of children: Not on file  . Years of education: Not on file  . Highest education level: Not on file  Occupational History  . Not on file  Tobacco Use  . Smoking status: Never Smoker  . Smokeless tobacco: Never Used  Substance and Sexual Activity  . Alcohol use: Never  . Drug use: Not Currently  . Sexual activity: Not Currently  Other Topics Concern  . Not on file  Social History Narrative   2  sons and 1 daughter    Married    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[employer]    Former [employer] 22 years    No guns, wears seat belt, safe in relationship   Vegan x 8-9 years as of 07/06/20    Social Determinants of Corporate investment bankerHealth   Financial Resource Strain: Not on file  Food Insecurity: Not on file  Transportation Needs: Not on file  Physical Activity: Not on file  Stress: Not on file  Social Connections: Not on file  Intimate Partner Violence: Not on file   Current Meds  Medication Sig  . CALCIUM-MAGNESUIUM-ZINC 333-133-8.3 MG TABS 1 capsule once daily  . Cholecalciferol 25 MCG (1000 UT) tablet Take by mouth.  . clobetasol cream (TEMOVATE) 0.05 % Apply 1 application topically 2 (two) times daily. Left chest and hands/arms  . cyanocobalamin 1000 MCG tablet Take by mouth.  . ERGOCALCIFEROL PO Take 25 mcg by mouth.  . estradiol (VIVELLE-DOT) 0.1 MG/24HR patch Place 2 patches onto the skin. Q3.5 days  . Omega-3 Fatty Acids (OMEGA-3 FISH OIL PO) 1 capsule once daily   No Known Allergies Recent Results (from the past 2160 hour(s))  PSA     Status: None   Collection Time: 07/01/20  8:04 AM  Result Value Ref Range   PSA 0.78 0.10 - 4.00 ng/mL    Comment: Test performed using Access Hybritech PSA Assay, a parmagnetic partical, chemiluminecent immunoassay.  Vitamin D (25 hydroxy)     Status: Abnormal   Collection Time: 07/01/20  8:04 AM  Result Value Ref Range   VITD 27.69 (L) 30.00 - 100.00 ng/mL  Urinalysis, Routine w reflex microscopic     Status: None   Collection Time: 07/01/20  8:04 AM  Result Value Ref Range   Color, Urine YELLOW YELLOW   APPearance CLEAR CLEAR   Specific Gravity, Urine 1.008 1.001 - 1.03   pH 5.5 5.0 - 8.0   Glucose, UA NEGATIVE NEGATIVE   Bilirubin Urine NEGATIVE NEGATIVE   Ketones, ur NEGATIVE NEGATIVE   Hgb urine dipstick NEGATIVE NEGATIVE   Protein, ur NEGATIVE NEGATIVE   Nitrite NEGATIVE NEGATIVE   Leukocytes,Ua NEGATIVE NEGATIVE  TSH     Status: None   Collection Time:  07/01/20  8:04 AM  Result Value Ref Range   TSH 2.64 0.35 - 4.50 uIU/mL  CBC with Differential/Platelet     Status: None   Collection Time: 07/01/20  8:04 AM  Result Value Ref Range   WBC 7.3 4.0 - 10.5 K/uL   RBC 4.44 4.22 - 5.81 Mil/uL   Hemoglobin 13.4 13.0 - 17.0 g/dL   HCT 03.0 09.2 - 33.0 %   MCV 90.2 78.0 - 100.0 fl   MCHC 33.4 30.0 - 36.0 g/dL   RDW 07.6 22.6 - 33.3 %   Platelets 167.0 150.0 - 400.0 K/uL   Neutrophils Relative % 62.3 43.0 - 77.0 %   Lymphocytes Relative 24.9 12.0 - 46.0 %   Monocytes Relative 9.9 3.0 - 12.0 %   Eosinophils Relative 2.3 0.0 - 5.0 %   Basophils Relative 0.6 0.0 - 3.0 %   Neutro Abs 4.5 1.4 - 7.7 K/uL   Lymphs Abs 1.8 0.7 - 4.0 K/uL   Monocytes Absolute 0.7 0.1 - 1.0 K/uL   Eosinophils Absolute 0.2 0.0 - 0.7 K/uL   Basophils Absolute 0.0 0.0 - 0.1 K/uL  Lipid panel     Status: Abnormal   Collection Time: 07/01/20  8:04 AM  Result Value Ref Range   Cholesterol 188 0 - 200 mg/dL    Comment: ATP III Classification       Desirable:  < 200 mg/dL               Borderline High:  200 - 239 mg/dL          High:  > = 545 mg/dL   Triglycerides 625.6 (H) 0.0 - 149.0 mg/dL    Comment: Normal:  <389 mg/dLBorderline High:  150 - 199 mg/dL   HDL 37.34 >28.76 mg/dL   VLDL 81.1 0.0 - 57.2 mg/dL   LDL Cholesterol 620 (H) 0 - 99 mg/dL   Total CHOL/HDL Ratio 5     Comment:                Men          Women1/2 Average Risk     3.4          3.3Average Risk          5.0          4.42X Average Risk          9.6          7.13X Average Risk  15.0          11.0                       NonHDL 148.22     Comment: NOTE:  Non-HDL goal should be 30 mg/dL higher than patient's LDL goal (i.e. LDL goal of < 70 mg/dL, would have non-HDL goal of < 100 mg/dL)  Comprehensive metabolic panel     Status: Abnormal   Collection Time: 07/01/20  8:04 AM  Result Value Ref Range   Sodium 138 135 - 145 mEq/L   Potassium 3.7 3.5 - 5.1 mEq/L   Chloride 103 96 - 112 mEq/L   CO2  30 19 - 32 mEq/L   Glucose, Bld 89 70 - 99 mg/dL   BUN 3 (L) 6 - 23 mg/dL   Creatinine, Ser 3.32 0.40 - 1.50 mg/dL   Total Bilirubin 0.5 0.2 - 1.2 mg/dL   Alkaline Phosphatase 64 39 - 117 U/L   AST 13 0 - 37 U/L   ALT 12 0 - 53 U/L   Total Protein 5.5 (L) 6.0 - 8.3 g/dL   Albumin 3.8 3.5 - 5.2 g/dL   GFR 95.18 >84.16 mL/min    Comment: Calculated using the CKD-EPI Creatinine Equation (2021)   Calcium 8.4 8.4 - 10.5 mg/dL   Objective  Body mass index is 31.67 kg/m. Wt Readings from Last 3 Encounters:  07/06/20 233 lb 8 oz (105.9 kg)  07/02/19 235 lb 6.4 oz (106.8 kg)  12/26/18 235 lb (106.6 kg)   Temp Readings from Last 3 Encounters:  07/06/20 98.1 F (36.7 C)  07/02/19 (!) 97.1 F (36.2 C) (Temporal)  06/21/18 98.2 F (36.8 C) (Other (Comment))   BP Readings from Last 3 Encounters:  07/06/20 110/78  07/02/19 106/70  12/26/18 104/66   Pulse Readings from Last 3 Encounters:  07/06/20 76  07/02/19 88  12/26/18 (!) 58    Physical Exam Vitals and nursing note reviewed.  Constitutional:      Appearance: Normal appearance. He is well-developed and well-groomed.  HENT:     Head: Normocephalic and atraumatic.  Eyes:     Conjunctiva/sclera: Conjunctivae normal.     Pupils: Pupils are equal, round, and reactive to light.  Cardiovascular:     Rate and Rhythm: Normal rate and regular rhythm.     Heart sounds: Normal heart sounds. No murmur heard.   Pulmonary:     Effort: Pulmonary effort is normal.     Breath sounds: Normal breath sounds.  Abdominal:     General: Abdomen is flat. Bowel sounds are normal.     Tenderness: There is no abdominal tenderness.  Skin:    General: Skin is warm and dry.  Neurological:     General: No focal deficit present.     Mental Status: He is alert and oriented to person, place, and time. Mental status is at baseline.     Gait: Gait normal.  Psychiatric:        Attention and Perception: Attention and perception normal.        Mood  and Affect: Mood and affect normal.        Speech: Speech normal.        Behavior: Behavior normal. Behavior is cooperative.        Thought Content: Thought content normal.        Cognition and Memory: Cognition and memory normal.        Judgment: Judgment normal.  Assessment  Plan  Annual physical exam - Plan: Comprehensive metabolic panel, Lipid panel, CBC with Differential/Platelet, Urinalysis, Routine w reflex microscopic, TSH, Vitamin D (25 hydroxy), Vitamin B12, PSA Declines flu shot Tdaputd  Hep C negative covid 19 06/13/19 J&J  X 1 declines had J&J 03/23/20 booster declines moderna/pfizer vaccines   Check fasting labs3/2023 Never smoker FH lung cancer in dad PSA 0.78 declines DRE Declines colonoscopy  08/04/19 negative cologuard Derm no needsfor nowcall back if wants referral  rec healthy diet and exercise   History of atrial fibrillation Off meds and no afib/flutter since 02/2020   Keloid of skin Clobetasol consider derm in future   Hyperlipidemia, unspecified hyperlipidemia type - Plan: Lipid panel Vegan rec healthy diet and exercise  Vitamin D deficiency - Plan: Vitamin D (25 hydroxy)    Provider: Dr. French Ana McLean-Scocuzza-Internal Medicine

## 2020-07-06 NOTE — Patient Instructions (Addendum)
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 Johna Sheriff eye *

## 2021-04-28 ENCOUNTER — Encounter (INDEPENDENT_AMBULATORY_CARE_PROVIDER_SITE_OTHER): Admitting: Internal Medicine

## 2021-04-28 DIAGNOSIS — Z1283 Encounter for screening for malignant neoplasm of skin: Secondary | ICD-10-CM

## 2021-04-28 DIAGNOSIS — L729 Follicular cyst of the skin and subcutaneous tissue, unspecified: Secondary | ICD-10-CM

## 2021-04-28 DIAGNOSIS — B029 Zoster without complications: Secondary | ICD-10-CM

## 2021-04-29 ENCOUNTER — Other Ambulatory Visit: Payer: Self-pay | Admitting: Internal Medicine

## 2021-04-29 DIAGNOSIS — L729 Follicular cyst of the skin and subcutaneous tissue, unspecified: Secondary | ICD-10-CM

## 2021-04-29 DIAGNOSIS — B029 Zoster without complications: Secondary | ICD-10-CM

## 2021-04-29 MED ORDER — DOXYCYCLINE HYCLATE 100 MG PO TABS: 100.0000 mg | ORAL_TABLET | Freq: Two times a day (BID) | ORAL | 0 refills | Status: DC

## 2021-04-29 MED ORDER — VALACYCLOVIR HCL 1 G PO TABS: 1000.0000 mg | ORAL_TABLET | Freq: Three times a day (TID) | ORAL | 0 refills | Status: DC

## 2021-04-29 NOTE — Telephone Encounter (Signed)
Photos of rash attached.   Please advise does Patient need to be seen?

## 2021-04-29 NOTE — Telephone Encounter (Signed)
That is acceptable to me.  Please direct my prescriptions to [facility] - [address] [place], [place], [place] [address]     You  [name] 4 hours ago (12:39 PM)   TM This looks like shingles  If you are agreeable to a my chart fee I can prescribe valtrex and an antibiotic for the cyst and I will refer you to a dermatologist for the cyst but they are booked out not sure when the derm appt will be   Please let me know     Tilford Pillar, CMA  You 7 hours ago (9:21 AM)   Photos of rash attached.    Please advise does Patient need to be seen?       Note    Jimmy [name] "Lorelle Gibbs"  P [facility] (supporting McLean-Scocuzza, Pasty Spillers, MD) 7 hours ago (9:14 AM)   WA A referral for the cyst might be premature.  My wife asked me if I had been scratching the cyst area.  I haven't been scratching.  She pointed out how it looked and speculated it might be shingles.     I sent you images of the rash on top of where the cyst is.  It does itch and occasionally, there is slight nerve pain in the area.  Maybe I should let this run its course before having the cyst tended to.  I would like your input.  I have my annual physical scheduled for April 6th I can get my referral at that point.  Attachments  [id number]  [id number]  [id number]    Tilford Pillar, CMA  You 8 hours ago (8:18 AM)   Molli Knock for referral?       Note    Jimmy [name] "Lorelle Gibbs"  P [facility] (supporting McLean-Scocuzza, Pasty Spillers, MD) 23 hours ago (4:47 PM)   WA I'd like a referral to have the cyst on the back of my left arm looked at.  It is possible this cyst is expanding.  Immediately adjacent to the cyst is a something new under the skin.  I thought it is best to have this checked out.  If you'd prefer to check it out first, that is fine.  Just let me know what step is next.   Thank you,   Left arms  + inflamed cyst and ? Shingles   Valtrex 1g tid x 7-14 days and doxycycline 100 mg bid x 7-10 days   dermatology referral  Agreeable to my chart fee  Time spent 5-10 minutes

## 2021-04-29 NOTE — Telephone Encounter (Signed)
Okay for referral?

## 2021-06-26 ENCOUNTER — Encounter: Payer: Self-pay | Admitting: Internal Medicine

## 2021-07-01 ENCOUNTER — Other Ambulatory Visit (INDEPENDENT_AMBULATORY_CARE_PROVIDER_SITE_OTHER)

## 2021-07-01 DIAGNOSIS — Z125 Encounter for screening for malignant neoplasm of prostate: Secondary | ICD-10-CM

## 2021-07-01 DIAGNOSIS — E559 Vitamin D deficiency, unspecified: Secondary | ICD-10-CM | POA: Diagnosis not present

## 2021-07-01 DIAGNOSIS — E785 Hyperlipidemia, unspecified: Secondary | ICD-10-CM | POA: Diagnosis not present

## 2021-07-01 DIAGNOSIS — Z1389 Encounter for screening for other disorder: Secondary | ICD-10-CM

## 2021-07-01 DIAGNOSIS — Z0000 Encounter for general adult medical examination without abnormal findings: Secondary | ICD-10-CM

## 2021-07-01 DIAGNOSIS — E538 Deficiency of other specified B group vitamins: Secondary | ICD-10-CM

## 2021-07-01 DIAGNOSIS — Z1329 Encounter for screening for other suspected endocrine disorder: Secondary | ICD-10-CM

## 2021-07-01 DIAGNOSIS — E291 Testicular hypofunction: Secondary | ICD-10-CM

## 2021-07-01 DIAGNOSIS — Z79899 Other long term (current) drug therapy: Secondary | ICD-10-CM

## 2021-07-01 LAB — CBC WITH DIFFERENTIAL/PLATELET
Basophils Absolute: 0 10*3/uL (ref 0.0–0.1)
Basophils Relative: 0.6 % (ref 0.0–3.0)
Eosinophils Absolute: 0.2 10*3/uL (ref 0.0–0.7)
Eosinophils Relative: 2.1 % (ref 0.0–5.0)
HCT: 36.8 % — ABNORMAL LOW (ref 39.0–52.0)
Hemoglobin: 12.6 g/dL — ABNORMAL LOW (ref 13.0–17.0)
Lymphocytes Relative: 23.5 % (ref 12.0–46.0)
Lymphs Abs: 1.9 10*3/uL (ref 0.7–4.0)
MCHC: 34.2 g/dL (ref 30.0–36.0)
MCV: 90.1 fl (ref 78.0–100.0)
Monocytes Absolute: 0.8 10*3/uL (ref 0.1–1.0)
Monocytes Relative: 9.7 % (ref 3.0–12.0)
Neutro Abs: 5.3 10*3/uL (ref 1.4–7.7)
Neutrophils Relative %: 64.1 % (ref 43.0–77.0)
Platelets: 155 10*3/uL (ref 150.0–400.0)
RBC: 4.08 Mil/uL — ABNORMAL LOW (ref 4.22–5.81)
RDW: 13.9 % (ref 11.5–15.5)
WBC: 8.3 10*3/uL (ref 4.0–10.5)

## 2021-07-01 LAB — COMPREHENSIVE METABOLIC PANEL
ALT: 16 U/L (ref 0–53)
AST: 15 U/L (ref 0–37)
Albumin: 3.9 g/dL (ref 3.5–5.2)
Alkaline Phosphatase: 73 U/L (ref 39–117)
BUN: 11 mg/dL (ref 6–23)
CO2: 26 mEq/L (ref 19–32)
Calcium: 8.8 mg/dL (ref 8.4–10.5)
Chloride: 102 mEq/L (ref 96–112)
Creatinine, Ser: 0.76 mg/dL (ref 0.40–1.50)
GFR: 95.12 mL/min (ref >60.00)
Glucose, Bld: 88 mg/dL (ref 70–99)
Potassium: 3.8 mEq/L (ref 3.5–5.1)
Sodium: 136 mEq/L (ref 135–145)
Total Bilirubin: 0.5 mg/dL (ref 0.2–1.2)
Total Protein: 5.5 g/dL — ABNORMAL LOW (ref 6.0–8.3)

## 2021-07-01 LAB — LIPID PANEL
Cholesterol: 172 mg/dL (ref 0–200)
HDL: 36.3 mg/dL — ABNORMAL LOW (ref >39.00)
LDL Cholesterol: 109 mg/dL — ABNORMAL HIGH (ref 0–99)
NonHDL: 135.54
Total CHOL/HDL Ratio: 5
Triglycerides: 131 mg/dL (ref 0.0–149.0)
VLDL: 26.2 mg/dL (ref 0.0–40.0)

## 2021-07-01 LAB — TSH: TSH: 1.18 u[IU]/mL (ref 0.35–5.50)

## 2021-07-01 LAB — PSA: PSA: 0.44 ng/mL (ref 0.10–4.00)

## 2021-07-01 LAB — VITAMIN D 25 HYDROXY (VIT D DEFICIENCY, FRACTURES): VITD: 30.87 ng/mL (ref 30.00–100.00)

## 2021-07-01 LAB — VITAMIN B12: Vitamin B-12: 256 pg/mL (ref 211–911)

## 2021-07-02 LAB — TESTOSTERONE TOTAL,FREE,BIO, MALES
Albumin: 3.7 g/dL (ref 3.6–5.1)
Sex Hormone Binding: 152 nmol/L — ABNORMAL HIGH (ref 22–77)
Testosterone, Bioavailable: 27.5 ng/dL — ABNORMAL LOW (ref 110.0–575.0)
Testosterone, Free: 16.1 pg/mL — ABNORMAL LOW (ref 46.0–224.0)
Testosterone: 491 ng/dL (ref 250–827)

## 2021-07-02 LAB — URINALYSIS, ROUTINE W REFLEX MICROSCOPIC
Bilirubin, UA: NEGATIVE
Glucose, UA: NEGATIVE
Ketones, UA: NEGATIVE
Leukocytes,UA: NEGATIVE
Nitrite, UA: NEGATIVE
Protein,UA: NEGATIVE
RBC, UA: NEGATIVE
Specific Gravity, UA: 1.016 (ref 1.005–1.030)
Urobilinogen, Ur: 0.2 mg/dL (ref 0.2–1.0)
pH, UA: 5.5 (ref 5.0–7.5)

## 2021-07-02 LAB — ESTRADIOL: Estradiol: 180 pg/mL — ABNORMAL HIGH (ref <=39)

## 2021-07-07 ENCOUNTER — Encounter: Payer: Self-pay | Admitting: Internal Medicine

## 2021-07-07 ENCOUNTER — Ambulatory Visit (INDEPENDENT_AMBULATORY_CARE_PROVIDER_SITE_OTHER): Admitting: Internal Medicine

## 2021-07-07 VITALS — BP 112/70 | HR 60 | Temp 97.9°F | Ht 71.89 in | Wt 233.6 lb

## 2021-07-07 DIAGNOSIS — Z0000 Encounter for general adult medical examination without abnormal findings: Secondary | ICD-10-CM | POA: Diagnosis not present

## 2021-07-07 DIAGNOSIS — Z1329 Encounter for screening for other suspected endocrine disorder: Secondary | ICD-10-CM

## 2021-07-07 DIAGNOSIS — Z13220 Encounter for screening for lipoid disorders: Secondary | ICD-10-CM | POA: Diagnosis not present

## 2021-07-07 DIAGNOSIS — Z8679 Personal history of other diseases of the circulatory system: Secondary | ICD-10-CM

## 2021-07-07 DIAGNOSIS — Z125 Encounter for screening for malignant neoplasm of prostate: Secondary | ICD-10-CM

## 2021-07-07 DIAGNOSIS — Z1389 Encounter for screening for other disorder: Secondary | ICD-10-CM

## 2021-07-07 DIAGNOSIS — Z79899 Other long term (current) drug therapy: Secondary | ICD-10-CM

## 2021-07-07 DIAGNOSIS — F640 Transsexualism: Secondary | ICD-10-CM

## 2021-07-07 DIAGNOSIS — E559 Vitamin D deficiency, unspecified: Secondary | ICD-10-CM

## 2021-07-07 NOTE — Progress Notes (Signed)
Chief Complaint  ?Patient presents with  ? Annual Exam  ? ?Annual doing well  ?H/o Afib cardiology in [place] wants pt to have echo and EKG ekg done today and echo ordered [facility] in [place]. H/o PACs after DCCV 09/2019  ?Reviewed labs low protein pt will try to eat more protein and does not eat meat ? ? ?Review of Systems  ?Constitutional:  Negative for weight loss.  ?HENT:  Negative for hearing loss.   ?Eyes:  Negative for blurred vision.  ?Respiratory:  Negative for shortness of breath.   ?Cardiovascular:  Negative for chest pain.  ?Gastrointestinal:  Negative for abdominal pain and blood in stool.  ?Musculoskeletal:  Negative for back pain.  ?Skin:  Negative for rash.  ?Neurological:  Negative for headaches.  ?Psychiatric/Behavioral:  Negative for depression.   ?Past Medical History:  ?Diagnosis Date  ? Atrial fibrillation (HCC)   ? had loop recorder   ? Atrial flutter (HCC)   ? HLD (hyperlipidemia)   ? ?Past Surgical History:  ?Procedure Laterality Date  ? CARDIOVERSION    ? 2021 for Afib in [place]  ? CHOLECYSTECTOMY    ? ?Family History  ?Problem Relation Age of Onset  ? COPD Mother   ? Atrial fibrillation Mother   ?     ? due to Afib died 37  ? Cancer Father   ?     lung cancer smoker age 50   ? Heart disease Father   ?     bypass at 53   ? Atrial fibrillation Sister   ? ?Social History  ? ?Socioeconomic History  ? Marital status: Married  ?  Spouse name: Not on file  ? Number of children: Not on file  ? Years of education: Not on file  ? Highest education level: Not on file  ?Occupational History  ? Not on file  ?Tobacco Use  ? Smoking status: Never  ? Smokeless tobacco: Never  ?Substance and Sexual Activity  ? Alcohol use: Never  ? Drug use: Not Currently  ? Sexual activity: Not Currently  ?Other Topics Concern  ? Not on file  ?Social History Narrative  ? 2 sons and 1 daughter   ? Married   ? 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[employer]   ? 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 No guns, wears seat belt, safe in relationship  ? Vegan x 8-9 years as of 07/06/20    ? ?Social Determinants of Health  ? ?Financial Resource Strain: Not on file  ?Food Insecurity: Not on file  ?Transportation Needs: Not on file  ?Physical Activity: Not on file  ?Stress: Not on file  ?Social Connections: Not on file  ?Intimate Partner Violence: Not on file  ? ?Current Meds  ?Medication Sig  ? CALCIUM-MAGNESUIUM-ZINC 333-133-8.3 MG TABS 1 capsule once daily  ? Cholecalciferol 25 MCG (1000 UT) tablet Take by mouth.  ? cyanocobalamin 1000 MCG tablet Take by mouth.  ? estradiol (VIVELLE-DOT) 0.1 MG/24HR patch Place 2 patches onto the skin. Q3.5 days  ? Multiple Vitamins-Minerals (MULTIVITAMIN WOMEN PO) Take by mouth daily.  ? Omega-3 Fatty Acids (OMEGA-3 FISH OIL PO) 1 capsule once daily  ? ?No Known Allergies ?Recent Results (from the past 2160 hour(s))  ?Estradiol     Status: Abnormal  ? Collection Time: 07/01/21  8:02 AM  ?Result Value Ref Range  ? Estradiol 180 (H) < OR = 39 pg/mL  ?  Comment: Reference range established on post-pubertal patient ?population. No pre-pubertal reference  range ?established using this assay. For any patients for ?whom low Estradiol levels are anticipated (e.g. males, ?pre-pubertal children and hypogonadal/post-menopausal  ?females), the [employer] [facility] ?Estradiol, Ultrasensitive, LCMSMS assay is recommended ?(order code [id number]). ?. ?Please note: patients being treated with the drug  ?fulvestrant (Faslodex(R)) have demonstrated significant  ?interference in immunoassay methods for estradiol  ?measurement. The cross reactivity could lead to falsely  ?elevated estradiol test results leading to an  ?inappropriate clinical assessment of estrogen status. ?[facility] order code 30289-Estradiol,  ?Ultrasensitive LC/MS/MS demonstrates negligible cross  ?reactivity with fulvestrant. ?  ?Testosterone Total,Free,Bio, Males-([facility])     Status: Abnormal  ? Collection Time: 07/01/21  8:02 AM  ?Result Value Ref Range  ? Testosterone 491 250 - 827 ng/dL  ?  Albumin 3.7 3.6 - 5.1 g/dL  ? Sex Hormone Binding 152 (H) 22 - 77 nmol/L  ? Testosterone, Free 16.1 (L) 46.0 - 224.0 pg/mL  ? Testosterone, Bioavailable 27.5 (L) 110.0 - 575.0 ng/dL  ?PSA     Status: None  ? Collection Time: 07/01/21  8:02 AM  ?Result Value Ref Range  ? PSA 0.44 0.10 - 4.00 ng/mL  ?  Comment: Test performed using Access Hybritech PSA Assay, a parmagnetic partical, chemiluminecent immunoassay.  ?Vitamin B12     Status: None  ? Collection Time: 07/01/21  8:02 AM  ?Result Value Ref Range  ? Vitamin B-12 256 211 - 911 pg/mL  ?Vitamin D (25 hydroxy)     Status: None  ? Collection Time: 07/01/21  8:02 AM  ?Result Value Ref Range  ? VITD 30.87 30.00 - 100.00 ng/mL  ?TSH     Status: None  ? Collection Time: 07/01/21  8:02 AM  ?Result Value Ref Range  ? TSH 1.18 0.35 - 5.50 uIU/mL  ?Urinalysis, Routine w reflex microscopic     Status: None  ? Collection Time: 07/01/21  8:02 AM  ?Result Value Ref Range  ? Specific Gravity, UA 1.016 1.005 - 1.030  ? pH, UA 5.5 5.0 - 7.5  ? Color, UA Yellow Yellow  ? Appearance Ur Clear Clear  ? Leukocytes,UA Negative Negative  ? Protein,UA Negative Negative/Trace  ? Glucose, UA Negative Negative  ? Ketones, UA Negative Negative  ? RBC, UA Negative Negative  ? Bilirubin, UA Negative Negative  ? Urobilinogen, Ur 0.2 0.2 - 1.0 mg/dL  ? Nitrite, UA Negative Negative  ? Microscopic Examination Comment   ?  Comment: Microscopic not indicated and not performed.  ?CBC with Differential/Platelet     Status: Abnormal  ? Collection Time: 07/01/21  8:02 AM  ?Result Value Ref Range  ? WBC 8.3 4.0 - 10.5 K/uL  ? RBC 4.08 (L) 4.22 - 5.81 Mil/uL  ? Hemoglobin 12.6 (L) 13.0 - 17.0 g/dL  ? HCT 36.8 (L) 39.0 - 52.0 %  ? MCV 90.1 78.0 - 100.0 fl  ? MCHC 34.2 30.0 - 36.0 g/dL  ? RDW 13.9 11.5 - 15.5 %  ? Platelets 155.0 150.0 - 400.0 K/uL  ? Neutrophils Relative % 64.1 43.0 - 77.0 %  ? Lymphocytes Relative 23.5 12.0 - 46.0 %  ? Monocytes Relative 9.7 3.0 - 12.0 %  ? Eosinophils Relative 2.1 0.0 -  5.0 %  ? Basophils Relative 0.6 0.0 - 3.0 %  ? Neutro Abs 5.3 1.4 - 7.7 K/uL  ? Lymphs Abs 1.9 0.7 - 4.0 K/uL  ? Monocytes Absolute 0.8 0.1 - 1.0 K/uL  ? Eosinophils Absolute 0.2 0.0 - 0.7 K/uL  ?  Basophils Absolute 0.0 0.0 - 0.1 K/uL  ?Lipid panel     Status: Abnormal  ? Collection Time: 07/01/21  8:02 AM  ?Result Value Ref Range  ? Cholesterol 172 0 - 200 mg/dL  ?  Comment: ATP III Classification       Desirable:  < 200 mg/dL               Borderline High:  200 - 239 mg/dL          High:  > = 166 mg/dL  ? Triglycerides 131.0 0.0 - 149.0 mg/dL  ?  Comment: Normal:  <150 mg/dLBorderline High:  150 - 199 mg/dL  ? HDL 36.30 (L) >39.00 mg/dL  ? VLDL 26.2 0.0 - 40.0 mg/dL  ? LDL Cholesterol 109 (H) 0 - 99 mg/dL  ? Total CHOL/HDL Ratio 5   ?  Comment:                Men          Women1/2 Average Risk     3.4          3.3Average Risk          5.0          4.42X Average Risk          9.6          7.13X Average Risk          15.0          11.0                      ? NonHDL 135.54   ?  Comment: NOTE:  Non-HDL goal should be 30 mg/dL higher than patient's LDL goal (i.e. LDL goal of < 70 mg/dL, would have non-HDL goal of < 100 mg/dL)  ?Comprehensive metabolic panel     Status: Abnormal  ? Collection Time: 07/01/21  8:02 AM  ?Result Value Ref Range  ? Sodium 136 135 - 145 mEq/L  ? Potassium 3.8 3.5 - 5.1 mEq/L  ? Chloride 102 96 - 112 mEq/L  ? CO2 26 19 - 32 mEq/L  ? Glucose, Bld 88 70 - 99 mg/dL  ? BUN 11 6 - 23 mg/dL  ? Creatinine, Ser 0.76 0.40 - 1.50 mg/dL  ? Total Bilirubin 0.5 0.2 - 1.2 mg/dL  ? Alkaline Phosphatase 73 39 - 117 U/L  ? AST 15 0 - 37 U/L  ? ALT 16 0 - 53 U/L  ? Total Protein 5.5 (L) 6.0 - 8.3 g/dL  ? Albumin 3.9 3.5 - 5.2 g/dL  ? GFR 95.12 >60.00 mL/min  ?  Comment: Calculated using the CKD-EPI Creatinine Equation (2021)  ? Calcium 8.8 8.4 - 10.5 mg/dL  ? ?Objective  ?Body mass index is 31.78 kg/m?. ?Wt Readings from Last 3 Encounters:  ?07/07/21 233 lb 9.6 oz (106 kg)  ?07/06/20 233 lb 8 oz (105.9 kg)   ?07/02/19 235 lb 6.4 oz (106.8 kg)  ? ?Temp Readings from Last 3 Encounters:  ?07/07/21 97.9 ?F (36.6 ?C) (Oral)  ?07/06/20 98.1 ?F (36.7 ?C)  ?07/02/19 (!) 97.1 ?F (36.2 ?C) (Temporal)  ? ?BP Readings from

## 2021-07-07 NOTE — Patient Instructions (Addendum)
Garden [employer] Life multivitamin (prenatal or multivitamin) ?Consider prevnar 5120 age 65 or prevnar 7613 and 1 year later pneumonia 1223  ? ? ?Pneumococcal Conjugate Vaccine (Prevnar 13) Suspension for Injection ?What is this medication? ?PNEUMOCOCCAL VACCINE (NEU mo KOK al vak SEEN) is a vaccine used to prevent pneumococcus bacterial infections. These bacteria can cause serious infections like pneumonia, meningitis, and blood infections. This vaccine will lower your chance of getting pneumonia. If you do get pneumonia, it can make your symptoms milder and your illness shorter. This vaccine will not treat an infection and will not cause infection. This vaccine is recommended for infants and young children, adults with certain medical conditions, and adults 65 years or older. ?This medicine may be used for other purposes; ask your health care provider or pharmacist if you have questions. ?COMMON BRAND NAME(S): Prevnar, Prevnar 13 ?What should I tell my care team before I take this medication? ?They need to know if you have any of these conditions: ?bleeding problems ?fever ?immune system problems ?an unusual or allergic reaction to pneumococcal vaccine, diphtheria toxoid, other vaccines, latex, other medicines, foods, dyes, or preservatives ?pregnant or trying to get pregnant ?breast-feeding ?How should I use this medication? ?This vaccine is for injection into a muscle. It is given by a health care professional. ?A copy of Vaccine Information Statements will be given before each vaccination. Read this sheet carefully each time. The sheet may change frequently. ?Talk to your pediatrician regarding the use of this medicine in children. While this drug may be prescribed for children as young as 416 weeks old for selected conditions, precautions do apply. ?Overdosage: If you think you have taken too much of this medicine contact a poison control center or emergency room at once. ?NOTE: This medicine is only for you. Do not share  this medicine with others. ?What if I miss a dose? ?It is important not to miss your dose. Call your doctor or health care professional if you are unable to keep an appointment. ?What may interact with this medication? ?medicines for cancer chemotherapy ?medicines that suppress your immune function ?steroid medicines like prednisone or cortisone ?This list may not describe all possible interactions. Give your health care provider a list of all the medicines, herbs, non-prescription drugs, or dietary supplements you use. Also tell them if you smoke, drink alcohol, or use illegal drugs. Some items may interact with your medicine. ?What should I watch for while using this medication? ?Mild fever and pain should go away in 3 days or less. Report any unusual symptoms to your doctor or health care professional. ?What side effects may I notice from receiving this medication? ?Side effects that you should report to your doctor or health care professional as soon as possible: ?allergic reactions like skin rash, itching or hives, swelling of the face, lips, or tongue ?breathing problems ?confused ?fast or irregular heartbeat ?fever over 102 degrees F ?seizures ?unusual bleeding or bruising ?unusual muscle weakness ?Side effects that usually do not require medical attention (report to your doctor or health care professional if they continue or are bothersome): ?aches and pains ?diarrhea ?fever of 102 degrees F or less ?headache ?irritable ?loss of appetite ?pain, tender at site where injected ?trouble sleeping ?This list may not describe all possible side effects. Call your doctor for medical advice about side effects. You may report side effects to FDA at 1-800-FDA-1088. ?Where should I keep my medication? ?This does not apply. This vaccine is given in a clinic, pharmacy, doctor's office,  or other health care setting and will not be stored at home. ?NOTE: This sheet is a summary. It may not cover all possible information. If  you have questions about this medicine, talk to your doctor, pharmacist, or health care provider. ?? 2022 Elsevier/Gold Standard (2013-12-25 00:00:00) ? ?Pneumococcal Conjugate Vaccine (Prevnar 20) Suspension for Injection ?What is this medication? ?PNEUMOCOCCAL VACCINE (NEU mo KOK al vak SEEN) is a vaccine. It prevents pneumococcus bacterial infections. These bacteria can cause serious infections like pneumonia, meningitis, and blood infections. This vaccine will not treat an infection and will not cause infection. This vaccine is recommended for adults 18 years and older. ?This medicine may be used for other purposes; ask your health care provider or pharmacist if you have questions. ?COMMON BRAND NAME(S): Prevnar 20 ?What should I tell my care team before I take this medication? ?They need to know if you have any of these conditions: ?bleeding disorder ?fever ?immune system problems ?an unusual or allergic reaction to pneumococcal vaccine, diphtheria toxoid, other vaccines, other medicines, foods, dyes, or preservatives ?pregnant or trying to get pregnant ?breast-feeding ?How should I use this medication? ?This vaccine is injected into a muscle. It is given by a health care provider. ?A copy of Vaccine Information Statements will be given before each vaccination. Be sure to read this information carefully each time. This sheet may change often. ?Talk to your health care provider about the use of this medicine in children. Special care may be needed. ?Overdosage: If you think you have taken too much of this medicine contact a poison control center or emergency room at once. ?NOTE: This medicine is only for you. Do not share this medicine with others. ?What if I miss a dose? ?This does not apply. This medicine is not for regular use. ?What may interact with this medication? ?medicines for cancer chemotherapy ?medicines that suppress your immune function ?steroid medicines like prednisone or cortisone ?This list may  not describe all possible interactions. Give your health care provider a list of all the medicines, herbs, non-prescription drugs, or dietary supplements you use. Also tell them if you smoke, drink alcohol, or use illegal drugs. Some items may interact with your medicine. ?What should I watch for while using this medication? ?Mild fever and pain should go away in 3 days or less. Report any unusual symptoms to your health care provider. ?What side effects may I notice from receiving this medication? ?Side effects that you should report to your doctor or health care professional as soon as possible: ?allergic reactions (skin rash, itching or hives; swelling of the face, lips, or tongue) ?confusion ?fast, irregular heartbeat ?fever over 102 degrees F ?muscle weakness ?seizures ?trouble breathing ?unusual bruising or bleeding ?Side effects that usually do not require medical attention (report to your doctor or health care professional if they continue or are bothersome): ?fever of 102 degrees F or less ?headache ?joint pain ?muscle cramps, pain ?pain, tender at site where injected ?This list may not describe all possible side effects. Call your doctor for medical advice about side effects. You may report side effects to FDA at 1-800-FDA-1088. ?Where should I keep my medication? ?This vaccine is only given by a health care provider. It will not be stored at home. ?NOTE: This sheet is a summary. It may not cover all possible information. If you have questions about this medicine, talk to your doctor, pharmacist, or health care provider. ?? 2022 Elsevier/Gold Standard (2019-12-04 00:00:00) ? ? ?Zoster Vaccine, Recombinant injection ?What  is this medication? ?ZOSTER VACCINE (ZOS ter vak SEEN) is a vaccine used to reduce the risk of getting shingles. This vaccine is not used to treat shingles or nerve pain from shingles. ?This medicine may be used for other purposes; ask your health care provider or pharmacist if you have  questions. ?COMMON BRAND NAME(S): SHINGRIX ?What should I tell my care team before I take this medication? ?They need to know if you have any of these conditions: ?cancer ?immune system problems ?an unus

## 2021-07-15 ENCOUNTER — Encounter: Payer: Self-pay | Admitting: Internal Medicine

## 2021-07-25 ENCOUNTER — Encounter: Payer: Self-pay | Admitting: Internal Medicine

## 2021-08-11 ENCOUNTER — Ambulatory Visit (HOSPITAL_COMMUNITY): Attending: Cardiology

## 2021-08-11 DIAGNOSIS — Z8679 Personal history of other diseases of the circulatory system: Secondary | ICD-10-CM | POA: Diagnosis present

## 2021-08-11 LAB — ECHOCARDIOGRAM COMPLETE
Area-P 1/2: 2.62 cm2
S' Lateral: 2.7 cm

## 2022-01-02 DIAGNOSIS — M79661 Pain in right lower leg: Secondary | ICD-10-CM | POA: Insufficient documentation

## 2022-01-02 HISTORY — DX: Pain in right lower leg: M79.661

## 2022-01-23 DIAGNOSIS — S42141A Displaced fracture of glenoid cavity of scapula, right shoulder, initial encounter for closed fracture: Secondary | ICD-10-CM | POA: Insufficient documentation

## 2022-05-17 ENCOUNTER — Encounter: Payer: Self-pay | Admitting: Family Medicine

## 2022-05-17 ENCOUNTER — Ambulatory Visit (INDEPENDENT_AMBULATORY_CARE_PROVIDER_SITE_OTHER): Payer: Medicare Other | Admitting: Family Medicine

## 2022-05-17 VITALS — BP 115/70 | HR 69 | Temp 98.1°F | Ht 71.0 in | Wt 247.0 lb

## 2022-05-17 DIAGNOSIS — E538 Deficiency of other specified B group vitamins: Secondary | ICD-10-CM

## 2022-05-17 DIAGNOSIS — Z9889 Other specified postprocedural states: Secondary | ICD-10-CM

## 2022-05-17 DIAGNOSIS — E782 Mixed hyperlipidemia: Secondary | ICD-10-CM | POA: Diagnosis not present

## 2022-05-17 DIAGNOSIS — Z6834 Body mass index (BMI) 34.0-34.9, adult: Secondary | ICD-10-CM

## 2022-05-17 DIAGNOSIS — Z1329 Encounter for screening for other suspected endocrine disorder: Secondary | ICD-10-CM

## 2022-05-17 DIAGNOSIS — L720 Epidermal cyst: Secondary | ICD-10-CM

## 2022-05-17 DIAGNOSIS — E661 Drug-induced obesity: Secondary | ICD-10-CM

## 2022-05-17 DIAGNOSIS — E6609 Other obesity due to excess calories: Secondary | ICD-10-CM

## 2022-05-17 DIAGNOSIS — Z8679 Personal history of other diseases of the circulatory system: Secondary | ICD-10-CM | POA: Diagnosis not present

## 2022-05-17 DIAGNOSIS — Z1211 Encounter for screening for malignant neoplasm of colon: Secondary | ICD-10-CM

## 2022-05-17 DIAGNOSIS — N62 Hypertrophy of breast: Secondary | ICD-10-CM

## 2022-05-17 DIAGNOSIS — E559 Vitamin D deficiency, unspecified: Secondary | ICD-10-CM

## 2022-05-17 DIAGNOSIS — Z79818 Long term (current) use of other agents affecting estrogen receptors and estrogen levels: Secondary | ICD-10-CM

## 2022-05-17 DIAGNOSIS — Z13220 Encounter for screening for lipoid disorders: Secondary | ICD-10-CM

## 2022-05-17 DIAGNOSIS — E66811 Obesity, class 1: Secondary | ICD-10-CM

## 2022-05-17 DIAGNOSIS — Z79899 Other long term (current) drug therapy: Secondary | ICD-10-CM

## 2022-05-17 DIAGNOSIS — Z23 Encounter for immunization: Secondary | ICD-10-CM | POA: Diagnosis not present

## 2022-05-17 LAB — TSH: TSH: 3.46 u[IU]/mL (ref 0.35–5.50)

## 2022-05-17 LAB — CBC WITH DIFFERENTIAL/PLATELET
Basophils Absolute: 0 10*3/uL (ref 0.0–0.1)
Basophils Relative: 0.5 % (ref 0.0–3.0)
Eosinophils Absolute: 0.2 10*3/uL (ref 0.0–0.7)
Eosinophils Relative: 3 % (ref 0.0–5.0)
HCT: 38.4 % — ABNORMAL LOW (ref 39.0–52.0)
Hemoglobin: 13.3 g/dL (ref 13.0–17.0)
Lymphocytes Relative: 19.7 % (ref 12.0–46.0)
Lymphs Abs: 1.5 10*3/uL (ref 0.7–4.0)
MCHC: 34.7 g/dL (ref 30.0–36.0)
MCV: 90.9 fl (ref 78.0–100.0)
Monocytes Absolute: 0.7 10*3/uL (ref 0.1–1.0)
Monocytes Relative: 9.8 % (ref 3.0–12.0)
Neutro Abs: 5.1 10*3/uL (ref 1.4–7.7)
Neutrophils Relative %: 67 % (ref 43.0–77.0)
Platelets: 197 10*3/uL (ref 150.0–400.0)
RBC: 4.23 Mil/uL (ref 4.22–5.81)
RDW: 14.7 % (ref 11.5–15.5)
WBC: 7.5 10*3/uL (ref 4.0–10.5)

## 2022-05-17 LAB — VITAMIN B12: Vitamin B-12: 261 pg/mL (ref 211–911)

## 2022-05-17 LAB — LIPID PANEL
Cholesterol: 219 mg/dL — ABNORMAL HIGH (ref 0–200)
HDL: 45 mg/dL (ref >39.00)
LDL Cholesterol: 136 mg/dL — ABNORMAL HIGH (ref 0–99)
NonHDL: 173.83
Total CHOL/HDL Ratio: 5
Triglycerides: 187 mg/dL — ABNORMAL HIGH (ref 0.0–149.0)
VLDL: 37.4 mg/dL (ref 0.0–40.0)

## 2022-05-17 LAB — COMPREHENSIVE METABOLIC PANEL
ALT: 17 U/L (ref 0–53)
AST: 15 U/L (ref 0–37)
Albumin: 4.1 g/dL (ref 3.5–5.2)
Alkaline Phosphatase: 69 U/L (ref 39–117)
BUN: 5 mg/dL — ABNORMAL LOW (ref 6–23)
CO2: 27 mEq/L (ref 19–32)
Calcium: 9.3 mg/dL (ref 8.4–10.5)
Chloride: 104 mEq/L (ref 96–112)
Creatinine, Ser: 0.73 mg/dL (ref 0.40–1.50)
GFR: 95.69 mL/min (ref >60.00)
Glucose, Bld: 90 mg/dL (ref 70–99)
Potassium: 4.2 mEq/L (ref 3.5–5.1)
Sodium: 138 mEq/L (ref 135–145)
Total Bilirubin: 0.4 mg/dL (ref 0.2–1.2)
Total Protein: 6 g/dL (ref 6.0–8.3)

## 2022-05-17 LAB — VITAMIN D 25 HYDROXY (VIT D DEFICIENCY, FRACTURES): VITD: 27.93 ng/mL — ABNORMAL LOW (ref 30.00–100.00)

## 2022-05-17 NOTE — Progress Notes (Unsigned)
   SUBJECTIVE:   Chief Complaint  Patient presents with   Transitions Of Care    Eye issue   HPI Patient presents to clinic to transfer care.  Concern for lesion on right eye. Been there for years.  Denies any pain, drainage, blocked tear ducts or interference with vision.  No extraocular pain.  Would like referral for removal of lesion.  PERTINENT PMH / PSH: History of keloids Hyperlipidemia History of A-fib not currently on anticoagulation.  Status post ablation Gynecomastia, on estrogen patch  OBJECTIVE:  BP 115/70   Pulse 69   Temp 98.1 F (36.7 C) (Oral)   Ht 5' 11"$  (1.803 m)   Wt 247 lb (112 kg)   SpO2 98%   BMI 34.45 kg/m    Physical Exam Constitutional:      General: He is not in acute distress.    Appearance: He is obese. He is not ill-appearing.  HENT:     Head: Normocephalic.  Eyes:     Conjunctiva/sclera: Conjunctivae normal.  Cardiovascular:     Rate and Rhythm: Normal rate and regular rhythm.     Pulses: Normal pulses.  Pulmonary:     Effort: Pulmonary effort is normal.     Breath sounds: Normal breath sounds. No wheezing or rhonchi.  Chest:  Breasts:    Breasts are symmetrical (bilateral gynecomastia).  Abdominal:     General: Bowel sounds are normal.     Tenderness: There is no abdominal tenderness.  Musculoskeletal:     Right lower leg: No edema.     Left lower leg: No edema.  Lymphadenopathy:     Cervical: No cervical adenopathy.  Skin:    General: Skin is warm.  Neurological:     Mental Status: He is alert. Mental status is at baseline.  Psychiatric:        Mood and Affect: Mood normal.        Behavior: Behavior normal.        Thought Content: Thought content normal.        Judgment: Judgment normal.     ASSESSMENT/PLAN:  Mixed hyperlipidemia -     Lipid panel  Vitamin D deficiency -     VITAMIN D 25 Hydroxy (Vit-D Deficiency, Fractures)  Lipid screening  Thyroid disorder screening -     TSH  B12 deficiency -      Vitamin B12  Class 1 drug-induced obesity with serious comorbidity and body mass index (BMI) of 34.0 to 34.9 in adult  Class 1 obesity due to excess calories with serious comorbidity and body mass index (BMI) of 34.0 to 34.9 in adult -     CBC with Differential/Platelet -     Comprehensive metabolic panel   PDMP reviewed***  No follow-ups on file.  Carollee Leitz, MD

## 2022-05-17 NOTE — Patient Instructions (Addendum)
It was a pleasure meeting you today. Thank you for allowing me to take part in your health care.  Our goals for today as we discussed include:  We will get some labs today.  If they are abnormal or we need to do something about them, I will call you.  If they are normal, I will send you a message on MyChart (if it is active) or a letter in the mail.  If you don't hear from [place] in 2 weeks, please call the office at the number below.   You received the pneumonia vaccine today.  This is a one time vaccine.  Recommend Shingles vaccine.  This is a 2 dose series and can be given at your local pharmacy.  Please talk to your pharmacist about this.   Cologuard due May 2024  If you have any questions or concerns, please do not hesitate to call the office at [phone].  I look forward to our next visit and until then take care and stay safe.  Regards,   Carollee Leitz, MD   [facility]

## 2022-05-20 ENCOUNTER — Encounter: Payer: Self-pay | Admitting: Family Medicine

## 2022-05-20 DIAGNOSIS — Z1211 Encounter for screening for malignant neoplasm of colon: Secondary | ICD-10-CM | POA: Insufficient documentation

## 2022-05-20 DIAGNOSIS — L720 Epidermal cyst: Secondary | ICD-10-CM | POA: Insufficient documentation

## 2022-05-20 DIAGNOSIS — E538 Deficiency of other specified B group vitamins: Secondary | ICD-10-CM | POA: Insufficient documentation

## 2022-05-20 DIAGNOSIS — E6609 Other obesity due to excess calories: Secondary | ICD-10-CM | POA: Insufficient documentation

## 2022-05-20 DIAGNOSIS — Z79899 Other long term (current) drug therapy: Secondary | ICD-10-CM | POA: Insufficient documentation

## 2022-05-20 DIAGNOSIS — N62 Hypertrophy of breast: Secondary | ICD-10-CM | POA: Insufficient documentation

## 2022-05-20 MED ORDER — VITAMIN D (ERGOCALCIFEROL) 1.25 MG (50000 UNIT) PO CAPS: 50000.0000 [IU] | ORAL_CAPSULE | ORAL | 0 refills | Status: DC

## 2022-05-20 NOTE — Assessment & Plan Note (Signed)
Chronic.  Nontender, mobile inclusion cyst noted in right inner canthus.  Visual fields and extraocular movements intact. Patient would like removal.  Follows with dermatology and patient plans to check with dermatologist if they will remove. Would refer to oculoplastics if dermatology unable to remove.

## 2022-05-20 NOTE — Assessment & Plan Note (Signed)
Chronic.  Secondary to estradiol use. Continue Vivelle-DOT 0.1 mg/24H, 2 patch every 3.5 days Follow with [facility] as scheduled

## 2022-05-20 NOTE — Assessment & Plan Note (Signed)
Cologuard due 08/04/22. Future orders placed.

## 2022-05-20 NOTE — Assessment & Plan Note (Signed)
History of elevated LDL. Not currently on statin therapy. Checking fasting lipids today.  Addendum: 05/20/2022@1322$  LDL elevated at 136 10-year ASCVD risk 12.1%, intermediate risk for mace Recommend Statin therapy Lifestyle and dietary modifications Consider Calcium score Recommendations sent vis MyChart.  Will await for patient to return message.  Follow up in 5 days if no response.

## 2022-05-20 NOTE — Assessment & Plan Note (Signed)
Chronic.  Currently on vitamin B-12 supplements.  Primarily vegan diet Checking vitamin B12 levels today Continue vitamin B12 1000 mcg daily  Addendum 05/20/2022 Vitamin B12 levels low norm Recommend increasing vitamin B12 to 1000 mcg daily MyChart message sent to patient.  Follow-up in 5 days to ensure received message.

## 2022-05-20 NOTE — Assessment & Plan Note (Signed)
Chronic.  Status post ablation 2021.  Follows with cardiothoracic surgeon, Dr. Fredric Mare in [place].  Has loop recorder in place.  Asymptomatic and rate controlled. Not currently on anticoagulation.

## 2022-05-20 NOTE — Assessment & Plan Note (Signed)
Chronic.  Eating more junk foods in the past few months.  Plans to get back into healthy lifestyle. Initiated discussions on medications for weight loss.  Patient currently not interested in medication assistance and politely declined.  Had previously lost weight with diet and exercise and would prefer to restart healthy lifestyle. Checking labs today.

## 2022-05-20 NOTE — Assessment & Plan Note (Addendum)
Currently on vitamin D 1000 IU daily Check vitamin D levels  Addendum 05/20/2022 at 1322 Vitamin D level is low. Stop current vitamin D supplementation Start vitamin D 1.25 mg weekly x 12 weeks then resume vitamin D 1000 IU daily Recommendations sent through [place].  Will follow-up in 5 days to ensure patient received message.

## 2022-05-20 NOTE — Assessment & Plan Note (Signed)
Chronic.  On estradiol patch 2 patches every 3.5 days. 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 Estradiol managed [facility]

## 2022-05-20 NOTE — Assessment & Plan Note (Signed)
Chronic.  Doing well.  Follows with Dr. Eliberto Ivory in [place]. History of recorder in situ. Scheduled to follow-up later this year.

## 2022-07-06 ENCOUNTER — Telehealth: Payer: Self-pay | Admitting: Family Medicine

## 2022-07-06 NOTE — Telephone Encounter (Signed)
Contacted Jimmy [name] to schedule their annual wellness visit. Welcome to Medicare visit Due by 03/04/2023.  Thank you,  [place] Direct dial  [phone]

## 2022-07-11 ENCOUNTER — Encounter: Admitting: Internal Medicine

## 2022-09-02 HISTORY — PX: OTHER SURGICAL HISTORY: SHX169

## 2022-09-04 ENCOUNTER — Other Ambulatory Visit: Payer: Self-pay | Admitting: Ophthalmology

## 2023-03-26 ENCOUNTER — Encounter: Payer: Self-pay | Admitting: Family Medicine

## 2023-03-26 ENCOUNTER — Ambulatory Visit (INDEPENDENT_AMBULATORY_CARE_PROVIDER_SITE_OTHER): Payer: Medicare Other | Admitting: Family Medicine

## 2023-03-26 VITALS — BP 112/64 | HR 69 | Temp 98.0°F | Resp 18 | Ht 72.0 in | Wt 241.4 lb

## 2023-03-26 DIAGNOSIS — Z1329 Encounter for screening for other suspected endocrine disorder: Secondary | ICD-10-CM

## 2023-03-26 DIAGNOSIS — E538 Deficiency of other specified B group vitamins: Secondary | ICD-10-CM

## 2023-03-26 DIAGNOSIS — E559 Vitamin D deficiency, unspecified: Secondary | ICD-10-CM | POA: Diagnosis not present

## 2023-03-26 DIAGNOSIS — R7309 Other abnormal glucose: Secondary | ICD-10-CM | POA: Diagnosis not present

## 2023-03-26 DIAGNOSIS — D649 Anemia, unspecified: Secondary | ICD-10-CM | POA: Diagnosis not present

## 2023-03-26 DIAGNOSIS — M6289 Other specified disorders of muscle: Secondary | ICD-10-CM | POA: Diagnosis not present

## 2023-03-26 DIAGNOSIS — Z1211 Encounter for screening for malignant neoplasm of colon: Secondary | ICD-10-CM

## 2023-03-26 DIAGNOSIS — Z8679 Personal history of other diseases of the circulatory system: Secondary | ICD-10-CM

## 2023-03-26 NOTE — Patient Instructions (Addendum)
It was a pleasure meeting you today. Thank you for allowing me to take part in your health care.  Our goals for today as we discussed include:  We will get some labs today.  If they are abnormal or we need to do something about them, I will call you.  If they are normal, I will send you a message on MyChart (if it is active) or a letter in the mail.  If you don't hear from [place] in 2 weeks, please call the office at the number below.     This is a list of the screening recommended for you and due dates:  Health Maintenance  Topic Date Due   Medicare Annual Wellness Visit  Never done   Zoster (Shingles) Vaccine (1 of 2) Never done   Cologuard (Stool DNA test)  08/04/2022   COVID-19 Vaccine (3 - 2024-25 season) 12/03/2022   Flu Shot  07/02/2023*   DTaP/Tdap/Td vaccine (2 - Td or Tdap) 06/20/2028   Pneumonia Vaccine  Completed   Hepatitis C Screening  Completed   HPV Vaccine  Aged Out  *Topic was postponed. The date shown is not the original due date.     Follow up as needed or if symptoms worsen  If you have any questions or concerns, please do not hesitate to call the office at [phone].  I look forward to our next visit and until then take care and stay safe.  Regards,   Dana Allan, MD   [facility]

## 2023-03-26 NOTE — Progress Notes (Signed)
SUBJECTIVE:   Chief Complaint  Patient presents with   muscle fatigue    X 6 months   HPI Presents for acute visit  Discussed the use of AI scribe software for clinical note transcription with the patient, who gave verbal consent to proceed.  History of Present Illness The patient, with a history of atrial fibrillation, presents with a chief complaint of severe muscle fatigue that began around April. He first noticed the fatigue while washing his hair, as he struggled to keep his hands raised. The fatigue also affects his gluteal muscles during walking and his jaw muscles during chewing, particularly on hard foods like pizza. The patient reports that the fatigue has not significantly progressed over the last four months, but it is persistent and concerning.  In addition to muscle fatigue, the patient experiences shortness of breath, particularly during exertion. However, he notes that his pulse oximetry readings have consistently been normal. The patient describes an incident of needing to rest after climbing three floors due to muscle fatigue, not breathlessness.  The patient has had two viral illnesses in the past six months and experienced an 18-day bout of atrial flutter, which was managed with amiodarone. He discontinued the medication after the atrial flutter resolved and due to a persistent cough that he believes was exacerbated by the medication.  The patient also reports a recent weight gain, which he attributes to increased consumption of unhealthy foods from the vending machine. He has since lost some of the gained weight. The patient denies any joint pain, fever, constipation, diarrhea, blood in stool, or urinary problems. He also denies any chest pain, but notes shortness of breath with exertion.  The patient recently retired due to the fatigue impacting his ability to work. He plans to start an exercise program in the new year and has a cruise planned for May. He is currently  on hormone replacement therapy, which he receives from [facility], and takes vitamin B12 and vitamin D supplements.    PERTINENT PMH / PSH: As above  OBJECTIVE:  BP 112/64   Pulse 69   Temp 98 F (36.7 C)   Resp 18   Ht 6' (1.829 m)   Wt 241 lb 6 oz (109.5 kg)   SpO2 96%   BMI 32.74 kg/m    Physical Exam Vitals reviewed.  Constitutional:      General: He is not in acute distress.    Appearance: Normal appearance. He is obese. He is not ill-appearing, toxic-appearing or diaphoretic.  Eyes:     General:        Right eye: No discharge.        Left eye: No discharge.  Cardiovascular:     Rate and Rhythm: Normal rate and regular rhythm.     Heart sounds: Normal heart sounds.  Pulmonary:     Effort: Pulmonary effort is normal.     Breath sounds: Normal breath sounds.  Abdominal:     General: Bowel sounds are normal.  Musculoskeletal:        General: Normal range of motion.     Right shoulder: Normal.     Left shoulder: Normal.     Right upper arm: Normal.     Left upper arm: Normal.     Right wrist: Normal.     Left wrist: Normal.     Cervical back: Normal and normal range of motion.     Thoracic back: Normal.     Lumbar back: Normal.  Right hip: Normal.     Left hip: Normal.     Right upper leg: Normal.     Left upper leg: Normal.     Right lower leg: Normal.     Left lower leg: Normal.  Skin:    General: Skin is warm and dry.  Neurological:     Mental Status: He is alert and oriented to person, place, and time. Mental status is at baseline.  Psychiatric:        Mood and Affect: Mood normal.        Behavior: Behavior normal.        Thought Content: Thought content normal.        Judgment: Judgment normal.        03/26/2023    2:53 PM 05/17/2022    9:04 AM 07/07/2021    9:13 AM 07/02/2019    8:32 AM  Depression screen PHQ 2/9  Decreased Interest 0 0 0 0  Down, Depressed, Hopeless 0 0 0 0  PHQ - 2 Score 0 0 0 0  Altered sleeping 0     Tired,  decreased energy 0     Change in appetite 0     Feeling bad or failure about yourself  0     Trouble concentrating 0     Moving slowly or fidgety/restless 0     Suicidal thoughts 0     PHQ-9 Score 0     Difficult doing work/chores Not difficult at all         03/26/2023    2:53 PM 05/17/2022    9:04 AM 07/02/2019    8:32 AM  GAD 7 : Generalized Anxiety Score  Nervous, Anxious, on Edge 0 0 0  Control/stop worrying 0 0 0  Worry too much - different things 0 0 0  Trouble relaxing 0 0 0  Restless 0 0 0  Easily annoyed or irritable 0 0 0  Afraid - awful might happen 0 0 0  Total GAD 7 Score 0 0 0  Anxiety Difficulty Not difficult at all Not difficult at all Not difficult at all    ASSESSMENT/PLAN:  Muscle fatigue Assessment & Plan: Progressive muscle fatigue over the past several months, affecting multiple muscle groups. No associated fever or joint pain. Recent weight gain and increased fatigue noted. No recent changes in medication. No muscular atrophy.  Exam benign today. -Order comprehensive blood work to evaluate for potential causes, including thyroid and autoimmune conditions.  Orders: -     Comprehensive metabolic panel -     Sedimentation rate -     Rheumatoid factor -     CK -     Aldolase -     Protein electrophoresis, serum -     C-reactive protein -     Protein Electrophoresis, Urine Rflx.; Future  Abnormal glucose -     Hemoglobin A1c  Vitamin D deficiency -     VITAMIN D 25 Hydroxy (Vit-D Deficiency, Fractures)  Vitamin B 12 deficiency -     Vitamin B12  Anemia, unspecified type -     CBC with Differential/Platelet  Thyroid disorder screening -     TSH  History of atrial fibrillation Assessment & Plan: History of atrial fibrillation with recent episodes. No current medications for this condition. -Follows with Cardiology in [place], recent cardiac ablation    Colon cancer screening -     Cologuard    General Health Maintenance Recent  retirement and plans to start  an exercise program in the new year. -Encourage adherence to planned exercise program for overall health maintenance.    PDMP reviewed  Return if symptoms worsen or fail to improve, for PCP.  Dana Allan, MD

## 2023-03-27 LAB — COMPREHENSIVE METABOLIC PANEL
ALT: 20 U/L (ref 0–53)
AST: 19 U/L (ref 0–37)
Albumin: 4.2 g/dL (ref 3.5–5.2)
Alkaline Phosphatase: 83 U/L (ref 39–117)
BUN: 7 mg/dL (ref 6–23)
CO2: 27 meq/L (ref 19–32)
Calcium: 9.1 mg/dL (ref 8.4–10.5)
Chloride: 103 meq/L (ref 96–112)
Creatinine, Ser: 0.78 mg/dL (ref 0.40–1.50)
GFR: 93.23 mL/min (ref >60.00)
Glucose, Bld: 93 mg/dL (ref 70–99)
Potassium: 4.3 meq/L (ref 3.5–5.1)
Sodium: 139 meq/L (ref 135–145)
Total Bilirubin: 0.5 mg/dL (ref 0.2–1.2)
Total Protein: 6 g/dL (ref 6.0–8.3)

## 2023-03-27 LAB — CBC WITH DIFFERENTIAL/PLATELET
Basophils Absolute: 0.1 10*3/uL (ref 0.0–0.1)
Basophils Relative: 0.9 % (ref 0.0–3.0)
Eosinophils Absolute: 0.4 10*3/uL (ref 0.0–0.7)
Eosinophils Relative: 5.9 % — ABNORMAL HIGH (ref 0.0–5.0)
HCT: 39.3 % (ref 39.0–52.0)
Hemoglobin: 13.2 g/dL (ref 13.0–17.0)
Lymphocytes Relative: 22.5 % (ref 12.0–46.0)
Lymphs Abs: 1.7 10*3/uL (ref 0.7–4.0)
MCHC: 33.5 g/dL (ref 30.0–36.0)
MCV: 92.7 fL (ref 78.0–100.0)
Monocytes Absolute: 0.7 10*3/uL (ref 0.1–1.0)
Monocytes Relative: 9.8 % (ref 3.0–12.0)
Neutro Abs: 4.6 10*3/uL (ref 1.4–7.7)
Neutrophils Relative %: 60.9 % (ref 43.0–77.0)
Platelets: 211 10*3/uL (ref 150.0–400.0)
RBC: 4.24 Mil/uL (ref 4.22–5.81)
RDW: 15.1 % (ref 11.5–15.5)
WBC: 7.6 10*3/uL (ref 4.0–10.5)

## 2023-03-27 LAB — VITAMIN B12: Vitamin B-12: 328 pg/mL (ref 211–911)

## 2023-03-27 LAB — HEMOGLOBIN A1C: Hgb A1c MFr Bld: 6.1 % (ref 4.6–6.5)

## 2023-03-27 LAB — VITAMIN D 25 HYDROXY (VIT D DEFICIENCY, FRACTURES): VITD: 31.03 ng/mL (ref 30.00–100.00)

## 2023-03-27 LAB — C-REACTIVE PROTEIN: CRP: <1 mg/dL (ref 0.5–20.0)

## 2023-03-27 LAB — SEDIMENTATION RATE: Sed Rate: 34 mm/h — ABNORMAL HIGH (ref 0–20)

## 2023-03-27 LAB — TSH: TSH: 3.72 u[IU]/mL (ref 0.35–5.50)

## 2023-03-27 LAB — CK: Total CK: 27 U/L (ref 7–232)

## 2023-03-29 LAB — PROTEIN ELECTROPHORESIS, SERUM
Albumin ELP: 3.8 g/dL (ref 3.8–4.8)
Alpha 1: 0.3 g/dL (ref 0.2–0.3)
Alpha 2: 1 g/dL — ABNORMAL HIGH (ref 0.5–0.9)
Beta 2: 0.3 g/dL (ref 0.2–0.5)
Beta Globulin: 0.4 g/dL (ref 0.4–0.6)
Gamma Globulin: 0.3 g/dL — ABNORMAL LOW (ref 0.8–1.7)
Total Protein: 6 g/dL — ABNORMAL LOW (ref 6.1–8.1)

## 2023-03-29 LAB — RHEUMATOID FACTOR: Rheumatoid fact SerPl-aCnc: <10 [IU]/mL (ref <14)

## 2023-03-29 LAB — ALDOLASE: Aldolase: 3.5 U/L (ref <=8.1)

## 2023-04-01 ENCOUNTER — Encounter: Payer: Self-pay | Admitting: Family Medicine

## 2023-04-01 DIAGNOSIS — D649 Anemia, unspecified: Secondary | ICD-10-CM | POA: Insufficient documentation

## 2023-04-01 DIAGNOSIS — M6289 Other specified disorders of muscle: Secondary | ICD-10-CM | POA: Insufficient documentation

## 2023-04-01 DIAGNOSIS — R7309 Other abnormal glucose: Secondary | ICD-10-CM | POA: Insufficient documentation

## 2023-04-01 DIAGNOSIS — Z1329 Encounter for screening for other suspected endocrine disorder: Secondary | ICD-10-CM | POA: Insufficient documentation

## 2023-04-01 NOTE — Assessment & Plan Note (Addendum)
Progressive muscle fatigue over the past several months, affecting multiple muscle groups. No associated fever or joint pain. Recent weight gain and increased fatigue noted. No recent changes in medication. No muscular atrophy.  Exam benign today. -Order comprehensive blood work to evaluate for potential causes, including thyroid and autoimmune conditions.

## 2023-04-01 NOTE — Assessment & Plan Note (Signed)
History of atrial fibrillation with recent episodes. No current medications for this condition. -Follows with Cardiology in [place], recent cardiac ablation

## 2023-04-06 LAB — PROTEIN ELECTROPHORESIS, URINE REFLEX
.: 0
Albumin ELP, Urine: 6.6 %
Alpha-1-Globulin, U: 3 %
Alpha-2-Globulin, U: 12.3 %
Beta Globulin, U: 41.5 %
Gamma Globulin, U: 36.6 %
M Component, Ur: 23.8 % — ABNORMAL HIGH
Protein, Ur: <4 mg/dL

## 2023-04-06 LAB — IFE REFLEX, RANDOM URINE

## 2023-04-09 ENCOUNTER — Other Ambulatory Visit: Payer: Self-pay

## 2023-04-09 DIAGNOSIS — R768 Other specified abnormal immunological findings in serum: Secondary | ICD-10-CM

## 2023-04-10 ENCOUNTER — Other Ambulatory Visit: Payer: Self-pay | Admitting: Family Medicine

## 2023-04-10 DIAGNOSIS — M6289 Other specified disorders of muscle: Secondary | ICD-10-CM

## 2023-04-10 DIAGNOSIS — R778 Other specified abnormalities of plasma proteins: Secondary | ICD-10-CM

## 2023-04-16 ENCOUNTER — Inpatient Hospital Stay: Payer: Medicare Other

## 2023-04-16 ENCOUNTER — Ambulatory Visit (INDEPENDENT_AMBULATORY_CARE_PROVIDER_SITE_OTHER): Payer: Medicare Other | Admitting: *Deleted

## 2023-04-16 ENCOUNTER — Encounter: Payer: Self-pay | Admitting: Internal Medicine

## 2023-04-16 ENCOUNTER — Inpatient Hospital Stay: Payer: Medicare Other | Attending: Internal Medicine | Admitting: Internal Medicine

## 2023-04-16 VITALS — BP 115/73 | HR 70 | Temp 97.7°F | Ht 72.0 in | Wt 244.2 lb

## 2023-04-16 VITALS — Ht 71.0 in | Wt 235.0 lb

## 2023-04-16 DIAGNOSIS — Z0000 Encounter for general adult medical examination without abnormal findings: Secondary | ICD-10-CM | POA: Diagnosis not present

## 2023-04-16 DIAGNOSIS — D472 Monoclonal gammopathy: Secondary | ICD-10-CM | POA: Diagnosis not present

## 2023-04-16 DIAGNOSIS — N189 Chronic kidney disease, unspecified: Secondary | ICD-10-CM | POA: Diagnosis not present

## 2023-04-16 DIAGNOSIS — R778 Other specified abnormalities of plasma proteins: Secondary | ICD-10-CM

## 2023-04-16 LAB — CBC WITH DIFFERENTIAL/PLATELET
Abs Immature Granulocytes: 0.04 10*3/uL (ref 0.00–0.07)
Basophils Absolute: 0.1 10*3/uL (ref 0.0–0.1)
Basophils Relative: 1 %
Eosinophils Absolute: 0.4 10*3/uL (ref 0.0–0.5)
Eosinophils Relative: 5 %
HCT: 39.6 % (ref 39.0–52.0)
Hemoglobin: 13.2 g/dL (ref 13.0–17.0)
Immature Granulocytes: 1 %
Lymphocytes Relative: 23 %
Lymphs Abs: 1.9 10*3/uL (ref 0.7–4.0)
MCH: 30.8 pg (ref 26.0–34.0)
MCHC: 33.3 g/dL (ref 30.0–36.0)
MCV: 92.3 fL (ref 80.0–100.0)
Monocytes Absolute: 0.8 10*3/uL (ref 0.1–1.0)
Monocytes Relative: 9 %
Neutro Abs: 5 10*3/uL (ref 1.7–7.7)
Neutrophils Relative %: 61 %
Platelets: 198 10*3/uL (ref 150–400)
RBC: 4.29 MIL/uL (ref 4.22–5.81)
RDW: 15.4 % (ref 11.5–15.5)
WBC: 8.2 10*3/uL (ref 4.0–10.5)
nRBC: 0 % (ref 0.0–0.2)

## 2023-04-16 LAB — COMPREHENSIVE METABOLIC PANEL
ALT: 29 U/L (ref 0–44)
AST: 25 U/L (ref 15–41)
Albumin: 4 g/dL (ref 3.5–5.0)
Alkaline Phosphatase: 73 U/L (ref 38–126)
Anion gap: 10 (ref 5–15)
BUN: 7 mg/dL — ABNORMAL LOW (ref 8–23)
CO2: 26 mmol/L (ref 22–32)
Calcium: 9.4 mg/dL (ref 8.9–10.3)
Chloride: 99 mmol/L (ref 98–111)
Creatinine, Ser: 0.74 mg/dL (ref 0.61–1.24)
GFR, Estimated: >60 mL/min (ref >60)
Glucose, Bld: 95 mg/dL (ref 70–99)
Potassium: 4 mmol/L (ref 3.5–5.1)
Sodium: 135 mmol/L (ref 135–145)
Total Bilirubin: 0.8 mg/dL (ref 0.0–1.2)
Total Protein: 6.4 g/dL — ABNORMAL LOW (ref 6.5–8.1)

## 2023-04-16 LAB — LACTATE DEHYDROGENASE: LDH: 85 U/L — ABNORMAL LOW (ref 98–192)

## 2023-04-16 NOTE — Progress Notes (Signed)
 [facility] CONSULT NOTE  Patient Care Team: Hope Merle, MD as PCP - General (Family Medicine) Rennie Cindy SAUNDERS, MD as Consulting Physician (Oncology)  CHIEF COMPLAINTS/PURPOSE OF CONSULTATION: Monoclonal gammopathy  HEMATOLOGY HISTORY  # CKD stage- [Dr.]  HISTORY OF PRESENTING ILLNESS: Patient ambulating-independently. Accompanied by wife  Jimmy [name] 68 y.o.  male has been referred to us  for further evaluation/work-up for monoclonal gammopathy.  Patient had workup for progressive muscle fatigue with his PCP, which showed abnormal urine M protein.  Patient has been recommended to follow-up with hematology for further recommendations/workup.  Of note patient noted to have worsening muscle fatigue especially with walking- for last 6 months. Admits to muscle fatigue all over the muscles of body.  Also complains of proximal muscle weakness upper extremities and lower extremities.  Of note patient had been estradiol  for 7-9 years ago.   Otherwise denies any tingling and numbness in the extremities.  No falls.   Review of Systems  Constitutional:  Positive for malaise/fatigue. Negative for chills, diaphoresis, fever and weight loss.  HENT:  Negative for nosebleeds and sore throat.   Eyes:  Negative for double vision.  Respiratory:  Negative for cough, hemoptysis, sputum production, shortness of breath and wheezing.   Cardiovascular:  Negative for chest pain, palpitations, orthopnea and leg swelling.  Gastrointestinal:  Negative for abdominal pain, blood in stool, constipation, diarrhea, heartburn, melena, nausea and vomiting.  Genitourinary:  Negative for dysuria, frequency and urgency.  Musculoskeletal:  Negative for back pain and joint pain.  Skin: Negative.  Negative for itching and rash.  Neurological:  Negative for dizziness, tingling, focal weakness, weakness and headaches.  Endo/Heme/Allergies:  Does not bruise/bleed easily.  Psychiatric/Behavioral:   Negative for depression. The patient is not nervous/anxious and does not have insomnia.     MEDICAL HISTORY:  Past Medical History:  Diagnosis Date   Atrial fibrillation (HCC)    had loop recorder, PAcs after DCCV 09/2019   Atrial flutter (HCC)    HLD (hyperlipidemia)    On amiodarone therapy 09/15/2019   Pain of right lower leg 01/02/2022    SURGICAL HISTORY: Past Surgical History:  Procedure Laterality Date   CARDIOVERSION     2021 for Afib in [place]   CHOLECYSTECTOMY      SOCIAL HISTORY: Social History   Socioeconomic History   Marital status: Married    Spouse name: Not on file   Number of children: Not on file   Years of education: Not on file   Highest education level: Bachelor's degree (e.g., BA, AB, BS)  Occupational History   Not on file  Tobacco Use   Smoking status: Never   Smokeless tobacco: Never  Vaping Use   Vaping status: Never Used  Substance and Sexual Activity   Alcohol use: Never   Drug use: Not Currently   Sexual activity: Not Currently  Other Topics Concern   Not on file  Social History Narrative   2 sons and 1 daughter    Married    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    No guns, wears seat belt, safe in relationship   Vegan x 8-9 years as of 07/06/20    Social Drivers of Health   Financial Resource Strain: Low Risk  (03/22/2023)   Overall Financial Resource Strain (CARDIA)    Difficulty of Paying Living Expenses: Not hard at all  Food Insecurity: No Food Insecurity (04/16/2023)   Hunger Vital Sign  Worried About Programme Researcher, Broadcasting/film/video in the Last Year: Never true    Ran Out of Food in the Last Year: Never true  Transportation Needs: No Transportation Needs (04/16/2023)   PRAPARE - Administrator, Civil Service (Medical): No    Lack of Transportation (Non-Medical): No  Physical Activity: Unknown (03/22/2023)   Exercise Vital Sign    Days of Exercise per Week: 0 days    Minutes of Exercise per Session: Not on file   Stress: No Stress Concern Present (03/22/2023)   [name]    Feeling of Stress : Not at all  Social Connections: Unknown (03/22/2023)   Social Connection and Isolation Panel [NHANES]    Frequency of Communication with Friends and Family: Patient declined    Frequency of Social Gatherings with Friends and Family: Patient declined    Attends Religious Services: 1 to 4 times per year    Active Member of [employer] or Organizations: Patient declined    Attends Banker Meetings: Not on file    Marital Status: Married  Intimate Partner Violence: Not At Risk (04/16/2023)   Humiliation, Afraid, Rape, and Kick questionnaire    Fear of Current or Ex-Partner: No    Emotionally Abused: No    Physically Abused: No    Sexually Abused: No    FAMILY HISTORY: Family History  Problem Relation Age of Onset   COPD Mother    Atrial fibrillation Mother        ? due to Afib died 78   Cancer Father        lung cancer smoker age 24    Heart disease Father        bypass at 32    Atrial fibrillation Sister     ALLERGIES:  has no known allergies.  MEDICATIONS:  Current Outpatient Medications  Medication Sig Dispense Refill   CALCIUM-MAGNESUIUM-ZINC 333-133-8.3 MG TABS 1 capsule once daily     Cholecalciferol  (VITAMIN D3) 50 MCG (2000 UT) capsule Take 2,000 Units by mouth daily.     cyanocobalamin  1000 MCG tablet Take 2,000 mcg by mouth.     estradiol  (VIVELLE -DOT) 0.1 MG/24HR patch Place 2 patches onto the skin. Q3.5 days     Multiple Vitamins-Minerals (MULTIVITAMIN WOMEN PO) Take by mouth daily.     Omega-3 1400 MG CAPS      No current facility-administered medications for this visit.   PHYSICAL EXAMINATION:   Vitals:   04/16/23 1058  BP: 115/73  Pulse: 70  Temp: 97.7 F (36.5 C)  SpO2: 98%   Filed Weights   04/16/23 1058  Weight: 244 lb 3.2 oz (110.8 kg)   Positive for bilateral gynecomastia.   Physical  Exam Vitals and nursing note reviewed.  HENT:     Head: Normocephalic and atraumatic.     Mouth/Throat:     Pharynx: Oropharynx is clear.  Eyes:     Extraocular Movements: Extraocular movements intact.     Pupils: Pupils are equal, round, and reactive to light.  Cardiovascular:     Rate and Rhythm: Normal rate and regular rhythm.  Abdominal:     Palpations: Abdomen is soft.  Musculoskeletal:        General: Normal range of motion.     Cervical back: Normal range of motion.  Skin:    General: Skin is warm.  Neurological:     General: No focal deficit present.     Mental Status: He  is alert and oriented to person, place, and time.  Psychiatric:        Behavior: Behavior normal.        Judgment: Judgment normal.     LABORATORY DATA:  I have reviewed the data as listed Lab Results  Component Value Date   WBC 8.2 04/16/2023   HGB 13.2 04/16/2023   HCT 39.6 04/16/2023   MCV 92.3 04/16/2023   PLT 198 04/16/2023   Recent Labs    05/17/22 1002 03/26/23 1535 04/16/23 1208  NA 138 139 135  K 4.2 4.3 4.0  CL 104 103 99  CO2 27 27 26   GLUCOSE 90 93 95  BUN 5* 7 7*  CREATININE 0.73 0.78 0.74  CALCIUM 9.3 9.1 9.4  GFRNONAA  --   --  >60  PROT 6.0 6.0*  6.0 6.4*  ALBUMIN 4.1 4.2 4.0  AST 15 19 25   ALT 17 20 29   ALKPHOS 69 83 73  BILITOT 0.4 0.5 0.8   No results found.  No results found for: KPAFRELGTCHN, LAMBDASER, KAPLAMBRATIO   Abnormal SPEP # DEC 2024-[PCP-SPEP-NEGATIVE; Hb 13; GFR-> 60 ca-WN; However, random UPEP- positive for M protein/Bence-Jones- Protein positive; kappa type.   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 Will plan to hold off any bone marrow biopsy at this time.  # Muscle fatigue/cramping-unclear etiology.  Clinically not suggestive of related to above monoclonal  gammopathy.  # Bilateral gynecomastia-secondary to exogenous estrogens  # History of a flutter [s/p ablation in [place] ]-no anticoagulation; regular rhythm.   Thank you Dr.Walsh for allowing me to participate in the care of your pleasant patient. Please do not hesitate to contact me with questions or concerns in the interim.  # DISPOSITION: # labs today-MM panel; cbc/cmp; K/l light chains;MM panel; 24 hour urine K/l ratio.  # follow up in 2 weeks- MD; no labs- dr.B  All questions were answered. The patient knows to call the clinic with any problems, questions or concerns.    Cindy JONELLE Joe, MD 04/16/2023 1:01 PM

## 2023-04-16 NOTE — Assessment & Plan Note (Addendum)
#   DEC 2024-[PCP-SPEP-NEGATIVE; Hb 13; GFR-> 60 ca-WN; However, random UPEP- positive for M protein/Bence-Jones- Protein positive; kappa type.   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 Will plan to hold off any bone marrow biopsy at this time.  # Muscle fatigue/cramping-unclear etiology.  Clinically not suggestive of related to above monoclonal gammopathy.  # Bilateral gynecomastia-secondary to exogenous estrogens  # History of a flutter [s/p ablation in [place] ]-no anticoagulation; regular rhythm.   Thank you Dr.Walsh for allowing me to participate in the care of your pleasant patient. Please do not hesitate to contact me with questions or concerns in the interim.  # DISPOSITION: # labs today-MM panel; cbc/cmp; K/l light chains;MM panel; 24 hour urine K/l ratio.  # follow up in 2 weeks- MD; no labs- dr.B

## 2023-04-16 NOTE — Patient Instructions (Signed)
 Denies hearing difficulties Mr. Couse , Thank you for taking time to come for your Medicare Wellness Visit. I appreciate your ongoing commitment to your health goals. Please review the following plan we discussed and let me know if I can assist you in the future.   Referrals/Orders/Follow-Ups/Clinician Recommendations: Remember to schedule an eye exam.  This is a list of the screening recommended for you and due dates:  Health Maintenance  Topic Date Due   Zoster (Shingles) Vaccine (1 of 2) Never done   Cologuard (Stool DNA test)  08/04/2022   COVID-19 Vaccine (3 - 2024-25 season) 12/03/2022   Flu Shot  07/02/2023*   Medicare Annual Wellness Visit  04/15/2024   DTaP/Tdap/Td vaccine (2 - Td or Tdap) 06/20/2028   Pneumonia Vaccine  Completed   Hepatitis C Screening  Completed   HPV Vaccine  Aged Out  *Topic was postponed. The date shown is not the original due date.    Advanced directives: (Declined) Advance directive discussed with you today. Even though you declined this today, please call our office should you change your mind, and we can give you the proper paperwork for you to fill out.  Next Medicare Annual Wellness Visit scheduled for next year: Yes 04/16/24 @ 1:40

## 2023-04-16 NOTE — Progress Notes (Signed)
 Subjective:   Jimmy [name] is a 68 y.o. male who presents for an Initial Medicare Annual Wellness Visit.  Visit Complete: Virtual I connected with  Jimmy [name] on 04/16/23 by a video and audio enabled telemedicine application and verified that I am speaking with the correct person using two identifiers.  Patient Location: Home  Provider Location: Office/Clinic  I discussed the limitations of evaluation and management by telemedicine. The patient expressed understanding and agreed to proceed.  Vital Signs: Because this visit was a virtual/telehealth visit, some criteria may be missing or patient reported. Any vitals not documented were not able to be obtained and vitals that have been documented are patient reported.  Patient Medicare AWV questionnaire was completed by the patient on 04/12/23; I have confirmed that all information answered by patient is correct and no changes since this date.  Cardiac Risk Factors include: advanced age (>57men, >23 women);dyslipidemia;male gender;obesity (BMI >30kg/m2), Risk factor comments: History of AFib     Objective:    Today's Vitals   04/16/23 1545  Weight: 235 lb (106.6 kg)  Height: 5' 11 (1.803 m)   Body mass index is 32.78 kg/m.     04/16/2023    3:59 PM  Advanced Directives  Does Patient Have a Medical Advance Directive? No  Would patient like information on creating a medical advance directive? No - Patient declined    Current Medications (verified) Outpatient Encounter Medications as of 04/16/2023  Medication Sig   CALCIUM-MAGNESUIUM-ZINC 333-133-8.3 MG TABS 1 capsule once daily   Cholecalciferol  (VITAMIN D3) 50 MCG (2000 UT) capsule Take 2,000 Units by mouth daily.   cyanocobalamin  1000 MCG tablet Take 2,000 mcg by mouth.   estradiol  (VIVELLE -DOT) 0.1 MG/24HR patch Place 2 patches onto the skin. Q3.5 days   Multiple Vitamins-Minerals (MULTIVITAMIN WOMEN PO) Take by mouth daily.   Omega-3 1400 MG CAPS     [DISCONTINUED] Vitamin D , Ergocalciferol , (DRISDOL ) 1.25 MG (50000 UNIT) CAPS capsule Take 1 capsule (50,000 Units total) by mouth every 7 (seven) days.   No facility-administered encounter medications on file as of 04/16/2023.    Allergies (verified) Patient has no known allergies.   History: Past Medical History:  Diagnosis Date   Atrial fibrillation (HCC)    had loop recorder, PAcs after DCCV 09/2019   Atrial flutter (HCC)    HLD (hyperlipidemia)    On amiodarone therapy 09/15/2019   Pain of right lower leg 01/02/2022   Past Surgical History:  Procedure Laterality Date   CARDIOVERSION     2021 for Afib in [place]   CHOLECYSTECTOMY     stye Right 09/2022   Family History  Problem Relation Age of Onset   COPD Mother    Atrial fibrillation Mother        ? due to Afib died 66   Cancer Father        lung cancer smoker age 49    Heart disease Father        bypass at 52    Atrial fibrillation Sister    Social History   Socioeconomic History   Marital status: Married    Spouse name: Not on file   Number of children: Not on file   Years of education: Not on file   Highest education level: Bachelor's degree (e.g., BA, AB, BS)  Occupational History   Not on file  Tobacco Use   Smoking status: Never   Smokeless tobacco: Never  Vaping Use   Vaping status: Never Used  Substance and Sexual Activity   Alcohol use: Never   Drug use: Not Currently   Sexual activity: Not Currently  Other Topics Concern   Not on file  Social History Narrative   2 sons and 1 daughter    Married    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    No guns, wears seat belt, safe in relationship   Vegan x 8-9 years as of 07/06/20    Social Drivers of Corporate Investment Banker Strain: Low Risk  (04/16/2023)   Overall Financial Resource Strain (CARDIA)    Difficulty of Paying Living Expenses: Not hard at all  Food Insecurity: No Food Insecurity (04/16/2023)   Hunger Vital Sign    Worried About  Running Out of Food in the Last Year: Never true    Ran Out of Food in the Last Year: Never true  Transportation Needs: No Transportation Needs (04/16/2023)   PRAPARE - Administrator, Civil Service (Medical): No    Lack of Transportation (Non-Medical): No  Physical Activity: Unknown (04/16/2023)   Exercise Vital Sign    Days of Exercise per Week: 0 days    Minutes of Exercise per Session: Not on file  Stress: No Stress Concern Present (04/16/2023)   [name]    Feeling of Stress : Not at all  Social Connections: Moderately Integrated (04/16/2023)   Social Connection and Isolation Panel [NHANES]    Frequency of Communication with Friends and Family: Twice a week    Frequency of Social Gatherings with Friends and Family: Twice a week    Attends Religious Services: 1 to 4 times per year    Active Member of [employer] or Organizations: No    Attends Engineer, Structural: Never    Marital Status: Married    Tobacco Counseling Counseling given: Not Answered   Clinical Intake:  Pre-visit preparation completed: Yes  Pain : No/denies pain     BMI - recorded: 32.78 Nutritional Status: BMI > 30  Obese Nutritional Risks: None Diabetes: No  How often do you need to have someone help you when you read instructions, pamphlets, or other written materials from your doctor or pharmacy?: 1 - Never  Interpreter Needed?: No  Information entered by :: R. Samanyu Tinnell LPN   Activities of Daily Living    04/16/2023    3:47 PM 04/12/2023    2:48 PM  In your present state of health, do you have any difficulty performing the following activities:  Hearing? 0 0  Vision? 0 0  Difficulty concentrating or making decisions? 0 0  Walking or climbing stairs? 0 0  Dressing or bathing? 0 0  Doing errands, shopping? 0 0  Preparing Food and eating ? N N  Using the Toilet? N N  In the past six months, have you accidently  leaked urine? N N  Do you have problems with loss of bowel control? N N  Managing your Medications? N N  Managing your Finances? N N  Housekeeping or managing your Housekeeping? N N    Patient Care Team: Hope Merle, MD as PCP - General (Family Medicine) Rennie Cindy SAUNDERS, MD as Consulting Physician (Oncology)  Indicate any recent Medical Services you may have received from other than [facility] providers in the past year (date may be approximate).     Assessment:   This is a routine wellness examination for Jimmy [name].  Hearing/Vision  screen Hearing Screening - Comments:: No issues Vision Screening - Comments:: readers   Goals Addressed             This Visit's Progress    Patient Stated       Wants to do at least 5000 steps a day       Depression Screen    04/16/2023    3:52 PM 04/16/2023   12:04 PM 03/26/2023    2:53 PM 05/17/2022    9:04 AM 07/07/2021    9:13 AM 07/02/2019    8:32 AM  PHQ 2/9 Scores  PHQ - 2 Score 0 0 0 0 0 0  PHQ- 9 Score 0 0 0       Fall Risk    04/16/2023    3:48 PM 04/12/2023    2:48 PM 03/26/2023    2:53 PM 05/17/2022    9:03 AM 07/07/2021    9:13 AM  Fall Risk   Falls in the past year? 0 0 0 0 0  Number falls in past yr: 0  0 0 0  Injury with Fall? 0  0 0 0  Risk for fall due to : No Fall Risks  No Fall Risks No Fall Risks No Fall Risks  Follow up Falls prevention discussed;Falls evaluation completed  Falls evaluation completed Falls evaluation completed Falls evaluation completed    MEDICARE RISK AT HOME: Medicare Risk at Home Any stairs in or around the home?: (Patient-Rptd) Yes If so, are there any without handrails?: (Patient-Rptd) No Home free of loose throw rugs in walkways, pet beds, electrical cords, etc?: (Patient-Rptd) No Adequate lighting in your home to reduce risk of falls?: (Patient-Rptd) Yes Life alert?: (Patient-Rptd) No Use of a cane, walker or w/c?: (Patient-Rptd) No Grab bars in the bathroom?: (Patient-Rptd)  No Shower chair or bench in shower?: (Patient-Rptd) No Elevated toilet seat or a handicapped toilet?: (Patient-Rptd) No Cognitive Function:        04/16/2023    3:59 PM  6CIT Screen  What Year? 0 points  What month? 0 points  What time? 0 points  Count back from 20 0 points  Months in reverse 0 points  Repeat phrase 0 points  Total Score 0 points    Immunizations Immunization History  Administered Date(s) Administered   Janssen (J&J) SARS-COV-2 Vaccination 06/13/2019, 03/23/2020   PNEUMOCOCCAL CONJUGATE-20 05/17/2022   Tdap 06/21/2018    TDAP status: Up to date  Flu Vaccine status: Declined, Education has been provided regarding the importance of this vaccine but patient still declined. Advised may receive this vaccine at local pharmacy or Health Dept. Aware to provide a copy of the vaccination record if obtained from local pharmacy or Health Dept. Verbalized acceptance and understanding.  Pneumococcal vaccine status: Up to date  Covid-19 vaccine status: Information provided on how to obtain vaccines.   Qualifies for Shingles Vaccine? Yes   Zostavax completed No   Shingrix Completed?: No.    Education has been provided regarding the importance of this vaccine. Patient has been advised to call insurance company to determine out of pocket expense if they have not yet received this vaccine. Advised may also receive vaccine at local pharmacy or Health Dept. Verbalized acceptance and understanding.  Screening Tests Health Maintenance  Topic Date Due   Medicare Annual Wellness (AWV)  Never done   Zoster Vaccines- Shingrix (1 of 2) Never done   Fecal DNA (Cologuard)  08/04/2022   COVID-19 Vaccine (3 - 2024-25 season) 12/03/2022  INFLUENZA VACCINE  07/02/2023 (Originally 11/02/2022)   DTaP/Tdap/Td (2 - Td or Tdap) 06/20/2028   Pneumonia Vaccine 8+ Years old  Completed   Hepatitis C Screening  Completed   HPV VACCINES  Aged Out    Health Maintenance  Health Maintenance  Due  Topic Date Due   Medicare Annual Wellness (AWV)  Never done   Zoster Vaccines- Shingrix (1 of 2) Never done   Fecal DNA (Cologuard)  08/04/2022   COVID-19 Vaccine (3 - 2024-25 season) 12/03/2022    Colorectal cancer screening: Type of screening: Cologuard. Completed 08/2019. Repeat every 3 years Patient has the test and will complete it and send back  Lung Cancer Screening: (Low Dose CT Chest recommended if Age 68-80 years, 20 pack-year currently smoking OR have quit w/in 15years.) does not qualify.     Additional Screening:  Hepatitis C Screening: does qualify; Completed 06/2018  Vision Screening: Recommended annual ophthalmology exams for early detection of glaucoma and other disorders of the eye. Is the patient up to date with their annual eye exam?  No  Who is the provider or what is the name of the office in which the patient attends annual eye exams? Patient will call and schedule an appointment. Names given to patient If pt is not established with a provider, would they like to be referred to a provider to establish care? No .   Dental Screening: Recommended annual dental exams for proper oral hygiene    Community Resource Referral / Chronic Care Management: CRR required this visit?  No   CCM required this visit?  No    Plan:     I have personally reviewed and noted the following in the patient's chart:   Medical and social history Use of alcohol, tobacco or illicit drugs  Current medications and supplements including opioid prescriptions. Patient is not currently taking opioid prescriptions. Functional ability and status Nutritional status Physical activity Advanced directives List of other physicians Hospitalizations, surgeries, and ER visits in previous 12 months Vitals Screenings to include cognitive, depression, and falls Referrals and appointments  In addition, I have reviewed and discussed with patient certain preventive protocols, quality metrics,  and best practice recommendations. A written personalized care plan for preventive services as well as general preventive health recommendations were provided to patient.     Angeline Fredericks, LPN   [id number]   After Visit Summary: (MyChart) Due to this being a telephonic visit, the after visit summary with patients personalized plan was offered to patient via MyChart   Nurse Notes: None

## 2023-04-16 NOTE — Progress Notes (Signed)
 Pt has cardiomonitor and link-ok for closed mri. He has the info on this device.

## 2023-04-17 LAB — KAPPA/LAMBDA LIGHT CHAINS
Kappa free light chain: 439.1 mg/L — ABNORMAL HIGH (ref 3.3–19.4)
Kappa, lambda light chain ratio: 91.48 — ABNORMAL HIGH (ref 0.26–1.65)
Lambda free light chains: 4.8 mg/L — ABNORMAL LOW (ref 5.7–26.3)

## 2023-04-18 ENCOUNTER — Other Ambulatory Visit: Payer: Self-pay

## 2023-04-18 DIAGNOSIS — R778 Other specified abnormalities of plasma proteins: Secondary | ICD-10-CM

## 2023-04-18 DIAGNOSIS — D472 Monoclonal gammopathy: Secondary | ICD-10-CM | POA: Diagnosis not present

## 2023-04-23 LAB — MULTIPLE MYELOMA PANEL, SERUM
Albumin SerPl Elph-Mcnc: 3.7 g/dL (ref 2.9–4.4)
Albumin/Glob SerPl: 1.7 (ref 0.7–1.7)
Alpha 1: 0.2 g/dL (ref 0.0–0.4)
Alpha2 Glob SerPl Elph-Mcnc: 1 g/dL (ref 0.4–1.0)
B-Globulin SerPl Elph-Mcnc: 0.9 g/dL (ref 0.7–1.3)
Gamma Glob SerPl Elph-Mcnc: 0.3 g/dL — ABNORMAL LOW (ref 0.4–1.8)
Globulin, Total: 2.3 g/dL (ref 2.2–3.9)
IgA: 21 mg/dL — ABNORMAL LOW (ref 61–437)
IgG (Immunoglobin G), Serum: 328 mg/dL — ABNORMAL LOW (ref 603–1613)
IgM (Immunoglobulin M), Srm: 16 mg/dL — ABNORMAL LOW (ref 20–172)
Total Protein ELP: 6 g/dL (ref 6.0–8.5)

## 2023-04-26 LAB — UPEP/TP, 24-HR URINE
Albumin, U: 39.3 %
Alpha 1, Urine: 2.8 %
Alpha 2, Urine: 0 %
Beta, Urine: 0.9 %
Gamma Globulin, Urine: 57.1 %
M-Spike, mg/24 hr: <67.4 mg/(24.h) — ABNORMAL HIGH
M-spike, %: 41.6 % — ABNORMAL HIGH
Total Protein, Urine-Ur/day: <162 mg/(24.h) — ABNORMAL HIGH (ref 30–150)
Total Protein, Urine: <4 mg/dL
Total Volume: 4050

## 2023-04-28 LAB — COLOGUARD: COLOGUARD: NEGATIVE

## 2023-05-01 ENCOUNTER — Encounter: Payer: Self-pay | Admitting: Family Medicine

## 2023-05-03 ENCOUNTER — Telehealth: Payer: Self-pay | Admitting: Internal Medicine

## 2023-05-03 ENCOUNTER — Ambulatory Visit
Admission: RE | Admit: 2023-05-03 | Discharge: 2023-05-03 | Disposition: A | Discharge status: Home / Self Care | Payer: Medicare Other | Source: Ambulatory Visit | Attending: Internal Medicine | Admitting: Internal Medicine

## 2023-05-03 ENCOUNTER — Inpatient Hospital Stay (HOSPITAL_BASED_OUTPATIENT_CLINIC_OR_DEPARTMENT_OTHER): Payer: Medicare Other | Admitting: Internal Medicine

## 2023-05-03 ENCOUNTER — Encounter: Payer: Self-pay | Admitting: Internal Medicine

## 2023-05-03 DIAGNOSIS — R778 Other specified abnormalities of plasma proteins: Secondary | ICD-10-CM

## 2023-05-03 DIAGNOSIS — D472 Monoclonal gammopathy: Secondary | ICD-10-CM | POA: Diagnosis not present

## 2023-05-03 NOTE — Progress Notes (Signed)
No concerns today

## 2023-05-03 NOTE — Progress Notes (Signed)
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  JAN 2025- SPEP- NEG; K/l= 91 [K=400].  EXOGENOUS ESTROGENS: Estradiol for 7-9 years  HEMATOLOGY HISTORY:   HISTORY OF PRESENTING ILLNESS: Patient ambulating-independently. Accompanied by wife.   Jimmy [name] 68 y.o.  male is here for follow up and to review the results up for monoclonal gammopathy.  Patient continues to complain of on going fatigue.  Otherwise denies any tingling and numbness in the extremities.  No falls.   Review of Systems  Constitutional:  Positive for malaise/fatigue. Negative for chills, diaphoresis, fever and weight loss.  HENT:  Negative for nosebleeds and sore throat.   Eyes:  Negative for double vision.  Respiratory:  Negative for cough, hemoptysis, sputum production, shortness of breath and wheezing.   Cardiovascular:  Negative for chest pain, palpitations, orthopnea and leg swelling.  Gastrointestinal:  Negative for abdominal pain, blood in stool, constipation, diarrhea, heartburn, melena, nausea and vomiting.  Genitourinary:  Negative for dysuria, frequency and urgency.  Musculoskeletal:  Negative for back pain and joint pain.  Skin: Negative.  Negative for itching and rash.  Neurological:  Negative for dizziness, tingling, focal weakness, weakness and headaches.  Endo/Heme/Allergies:  Does not bruise/bleed easily.  Psychiatric/Behavioral:  Negative for depression. The patient is not nervous/anxious and does not have insomnia.     MEDICAL HISTORY:  Past Medical History:  Diagnosis Date   Atrial fibrillation (HCC)    had loop recorder,  PAcs after DCCV 09/2019   Atrial flutter (HCC)    HLD (hyperlipidemia)    On amiodarone therapy 09/15/2019   Pain of right lower leg 01/02/2022    SURGICAL HISTORY: Past Surgical History:  Procedure Laterality Date   CARDIOVERSION     2021 for Afib in [place]   CHOLECYSTECTOMY     stye Right 09/2022    SOCIAL HISTORY: Social History   Socioeconomic History   Marital status: Married    Spouse name: Not on file   Number of children: Not on file   Years of education: Not on file   Highest education level: Bachelor's degree (e.g., BA, AB, BS)  Occupational History   Not on file  Tobacco Use   Smoking status: Never   Smokeless tobacco: Never  Vaping Use   Vaping status: Never Used  Substance and Sexual Activity   Alcohol use: Never   Drug use: Not Currently   Sexual activity: Not Currently  Other Topics Concern   Not on file  Social History Narrative   2 sons and 1 daughter    Married    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    No guns, wears seat belt, safe in relationship   Vegan x 8-9 years as of 07/06/20    Social Drivers of Corporate investment banker Strain: Low Risk  (04/16/2023)   Overall Financial Resource Strain (CARDIA)    Difficulty of Paying Living Expenses: Not hard at all  Food Insecurity: No Food Insecurity (04/16/2023)   Hunger Vital Sign    Worried About Running Out of Food in the Last Year: Never true    Ran Out of Food in the Last Year: Never  true  Transportation Needs: No Transportation Needs (04/16/2023)   PRAPARE - Administrator, Civil Service (Medical): No    Lack of Transportation (Non-Medical): No  Physical Activity: Unknown (04/16/2023)   Exercise Vital Sign    Days of Exercise per Week: 0 days    Minutes of Exercise per Session: Not on file  Stress: No Stress Concern Present (04/16/2023)   [name]    Feeling of Stress : Not at all  Social Connections:  Moderately Integrated (04/16/2023)   Social Connection and Isolation Panel [NHANES]    Frequency of Communication with Friends and Family: Twice a week    Frequency of Social Gatherings with Friends and Family: Twice a week    Attends Religious Services: 1 to 4 times per year    Active Member of [employer] or Organizations: No    Attends Banker Meetings: Never    Marital Status: Married  Catering manager Violence: Not At Risk (04/16/2023)   Humiliation, Afraid, Rape, and Kick questionnaire    Fear of Current or Ex-Partner: No    Emotionally Abused: No    Physically Abused: No    Sexually Abused: No    FAMILY HISTORY: Family History  Problem Relation Age of Onset   COPD Mother    Atrial fibrillation Mother        ? due to Afib died 109   Cancer Father        lung cancer smoker age 22    Heart disease Father        bypass at 70    Atrial fibrillation Sister     ALLERGIES:  has no known allergies.  MEDICATIONS:  Current Outpatient Medications  Medication Sig Dispense Refill   CALCIUM-MAGNESUIUM-ZINC 333-133-8.3 MG TABS 1 capsule once daily     Cholecalciferol (VITAMIN D3) 50 MCG (2000 UT) capsule Take 2,000 Units by mouth daily.     cyanocobalamin 1000 MCG tablet Take 2,000 mcg by mouth.     estradiol (VIVELLE-DOT) 0.1 MG/24HR patch Place 2 patches onto the skin. Q3.5 days     Multiple Vitamins-Minerals (MULTIVITAMIN WOMEN PO) Take by mouth daily.     Omega-3 1400 MG CAPS      No current facility-administered medications for this visit.   PHYSICAL EXAMINATION:   Vitals:   05/03/23 0919  BP: (!) 115/56  Pulse: 64  Temp: 98.1 F (36.7 C)  SpO2: 98%   Filed Weights   05/03/23 0919  Weight: 239 lb 12.8 oz (108.8 kg)   Positive for bilateral gynecomastia.   Physical Exam Vitals and nursing note reviewed.  HENT:     Head: Normocephalic and atraumatic.     Mouth/Throat:     Pharynx: Oropharynx is clear.  Eyes:     Extraocular Movements: Extraocular  movements intact.     Pupils: Pupils are equal, round, and reactive to light.  Cardiovascular:     Rate and Rhythm: Normal rate and regular rhythm.  Abdominal:     Palpations: Abdomen is soft.  Musculoskeletal:        General: Normal range of motion.     Cervical back: Normal range of motion.  Skin:    General: Skin is warm.  Neurological:     General: No focal deficit present.     Mental Status: He is alert and oriented to person, place, and time.  Psychiatric:        Behavior: Behavior normal.  Judgment: Judgment normal.     LABORATORY DATA:  I have reviewed the data as listed Lab Results  Component Value Date   WBC 8.2 04/16/2023   HGB 13.2 04/16/2023   HCT 39.6 04/16/2023   MCV 92.3 04/16/2023   PLT 198 04/16/2023   Recent Labs    05/17/22 1002 03/26/23 1535 04/16/23 1208  NA 138 139 135  K 4.2 4.3 4.0  CL 104 103 99  CO2 27 27 26   GLUCOSE 90 93 95  BUN 5* 7 7*  CREATININE 0.73 0.78 0.74  CALCIUM 9.3 9.1 9.4  GFRNONAA  --   --  >60  PROT 6.0 6.0*  6.0 6.4*  ALBUMIN 4.1 4.2 4.0  AST 15 19 25   ALT 17 20 29   ALKPHOS 69 83 73  BILITOT 0.4 0.5 0.8   No results found.  Lab Results  Component Value Date   KPAFRELGTCHN 439.1 (H) 04/16/2023   LAMBDASER 4.8 (L) 04/16/2023   KAPLAMBRATIO 91.48 (H) 04/16/2023     Abnormal SPEP # LIGHT CHAIN MONOCLONAL GAMMOPATHY -DEC 2024-[PCP-SPEP-NEGATIVE; Hb 13; GFR-> 60 ca-WN; However, random UPEP- positive for M protein/Bence-Jones- Protein positive; kappa type.  JAN 2025- SPEP- NEG; K/l= 91 [K=400]. Dx= includes smoldering MM vs active MM.   # Currently no evidence of any end organ dysfunction-however recommend further workup including a bone marrow biopsy; and also skeletal survey. Given significantly abnormal kappa lambda light chains. Discussed with the patient the bone marrow biopsy and aspiration indication and procedure at length. Given significant discomfort involved-I would recommend under sedation/with  radiology in the hospital. I discussed the potential complications include-bleeding/trauma and risk of infection; which are fortunately very rare.  Patient is in agreement. Patient will sign the consent prior to the procedure. Bone marrow biopsy/aspiration is ordered.   # Muscle fatigue/cramping-unclear etiology.  Clinically not suggestive of related to above monoclonal gammopathy.  # Bilateral gynecomastia-secondary to exogenous estrogens  # History of a flutter [s/p ablation in [place]]-no anticoagulation; regular rhythm.   # Sebaceous Cyst s/p evaluation with [place] [facility] [2023]- monitor for now  # Vaccination: discussed re: increased risk of infection- recommend vaccinations- including shingles   # DISPOSITION: # Bone survey-  # bone marrow biopsy ASAP # follow up in 3 weeks- MD; no labs- Dr.B   All questions were answered. The patient knows to call the clinic with any problems, questions or concerns.    Earna Coder, MD 05/03/2023 9:13 PM

## 2023-05-03 NOTE — Telephone Encounter (Signed)
Patient called and said his bone marrow biopsy is scheduled for 2/14. He is concerned that the results will not be back in time for his next appointment with Dr. Leonard Schwartz on 2/20. I told him I would reach out and let the team know and see what they think in regards to the appointment with Dr. B and if it needs to be pushed out at all or not. Please advise

## 2023-05-03 NOTE — Assessment & Plan Note (Addendum)
#   LIGHT CHAIN MONOCLONAL GAMMOPATHY -DEC 2024-[PCP-SPEP-NEGATIVE; Hb 13; GFR-> 60 ca-WN; However, random UPEP- positive for M protein/Bence-Jones- Protein positive; kappa type.  JAN 2025- SPEP- NEG; K/l= 91 [K=400]. Dx= includes smoldering MM vs active MM.   # Currently no evidence of any end organ dysfunction-however recommend further workup including a bone marrow biopsy; and also skeletal survey. Given significantly abnormal kappa lambda light chains. Discussed with the patient the bone marrow biopsy and aspiration indication and procedure at length. Given significant discomfort involved-I would recommend under sedation/with radiology in the hospital. I discussed the potential complications include-bleeding/trauma and risk of infection; which are fortunately very rare.  Patient is in agreement. Patient will sign the consent prior to the procedure. Bone marrow biopsy/aspiration is ordered.   # Muscle fatigue/cramping-unclear etiology.  Clinically not suggestive of related to above monoclonal gammopathy.  # Bilateral gynecomastia-secondary to exogenous estrogens  # History of a flutter [s/p ablation in [place]]-no anticoagulation; regular rhythm.   # Sebaceous Cyst s/p evaluation with [place] [facility] [2023]- monitor for now  # Vaccination: discussed re: increased risk of infection- recommend vaccinations- including shingles   # DISPOSITION: # Bone survey-  # bone marrow biopsy ASAP # follow up in 3 weeks- MD; no labs- Dr.B

## 2023-05-06 ENCOUNTER — Other Ambulatory Visit: Payer: Self-pay | Admitting: Family Medicine

## 2023-05-07 ENCOUNTER — Telehealth: Payer: Self-pay | Admitting: Family Medicine

## 2023-05-07 NOTE — Telephone Encounter (Signed)
Spoke to pt and he stated that he would see [place] on 05/21/2023.

## 2023-05-07 NOTE — Telephone Encounter (Signed)
 Patient need lab orders.

## 2023-05-08 NOTE — Telephone Encounter (Signed)
Called and spoke to pt he said he would get them the day of his visit.

## 2023-05-10 ENCOUNTER — Encounter: Payer: Self-pay | Admitting: Internal Medicine

## 2023-05-10 NOTE — Progress Notes (Signed)
 I spoke to patient reviewed his concerns for amyloidosis low on risk of possibility-GB

## 2023-05-11 ENCOUNTER — Other Ambulatory Visit: Payer: Self-pay | Admitting: *Deleted

## 2023-05-11 ENCOUNTER — Other Ambulatory Visit: Payer: Self-pay | Admitting: Nurse Practitioner

## 2023-05-11 DIAGNOSIS — R778 Other specified abnormalities of plasma proteins: Secondary | ICD-10-CM

## 2023-05-14 ENCOUNTER — Other Ambulatory Visit: Payer: Medicare Other

## 2023-05-15 ENCOUNTER — Other Ambulatory Visit: Payer: Self-pay | Admitting: Radiology

## 2023-05-15 DIAGNOSIS — R778 Other specified abnormalities of plasma proteins: Secondary | ICD-10-CM

## 2023-05-17 ENCOUNTER — Other Ambulatory Visit: Payer: Self-pay | Admitting: Radiology

## 2023-05-17 NOTE — Progress Notes (Signed)
Patient for IR Bone Marrow Biopsy on Friday 05/18/2023, I called and spoke with the patient on the phone and gave pre-procedure instructions. Pt was made aware to be here at 7:30a, NPO after MN prior to procedure as well as driver post procedure/recovery/discharge. Pt stated understanding.  Called 05/17/2023

## 2023-05-17 NOTE — H&P (Signed)
Chief Complaint: Patient was seen in consultation today for monoclonal gammopathy   Procedure: Bone marrow biopsy   Referring Physician(s): Earna Coder  Supervising Physician: Oley Balm  Patient Status: [facility] - Out-pt  History of Present Illness: Jimmy [name] is a 68 y.o. male with a history of A fib and HLD who initially presented to his PCP w/ complaints of progressive muscle fatigue, including proximal muscle weakness of upper and lower extremities. Workup revealed abnormal urine M protein leading to subsequent hem/onc referral. Further evaluation with oncology revealed an abnormal SPEP w/ light chain monoclonal gammopathy. Patient referred to IR for bone marrow biopsy.   Patient is currently resting in bed with his wife at the bedside. He is currently without any complaints. NPO since midnight. VSS. Afebrile. Labs WNL. All patient and family's questions and concerns answered at bedside.   Code Status: Full Code  Past Medical History:  Diagnosis Date   Atrial fibrillation (HCC)    had loop recorder, PAcs after DCCV 09/2019   Atrial flutter (HCC)    HLD (hyperlipidemia)    On amiodarone therapy 09/15/2019   Pain of right lower leg 01/02/2022    Past Surgical History:  Procedure Laterality Date   CARDIOVERSION     2021 for Afib in [place]   CHOLECYSTECTOMY     MINIMALLY INVASIVE MAZE PROCEDURE     done in [place] Dr R. Sheppard Penton   stye Right 09/2022    Allergies: Patient has no known allergies.  Medications: Prior to Admission medications   Medication Sig Start Date End Date Taking? Authorizing Provider  CALCIUM-MAGNESUIUM-ZINC 333-133-8.3 MG TABS 1 capsule once daily 02/02/19   [provider]  Cholecalciferol (VITAMIN D3) 50 MCG (2000 UT) capsule Take 2,000 Units by mouth daily.    [provider]  cyanocobalamin 1000 MCG tablet Take 2,000 mcg by mouth. 06/18/19   [provider]  estradiol (VIVELLE-DOT) 0.1 MG/24HR patch Place 2  patches onto the skin. Q3.5 days 10/09/18   [provider]  Multiple Vitamins-Minerals (MULTIVITAMIN WOMEN PO) Take by mouth daily.    [provider]  Omega-3 1400 MG CAPS  04/04/23   [provider]     Family History  Problem Relation Age of Onset   COPD Mother    Atrial fibrillation Mother        ? due to Afib died 31   Cancer Father        lung cancer smoker age 25    Heart disease Father        bypass at 62    Atrial fibrillation Sister     Social History   Socioeconomic History   Marital status: Married    Spouse name: Not on file   Number of children: Not on file   Years of education: Not on file   Highest education level: Bachelor's degree (e.g., BA, AB, BS)  Occupational History   Not on file  Tobacco Use   Smoking status: Never   Smokeless tobacco: Never  Vaping Use   Vaping status: Never Used  Substance and Sexual Activity   Alcohol use: Never   Drug use: Not Currently   Sexual activity: Not Currently  Other Topics Concern   Not on file  Social History Narrative   2 sons and 1 daughter    Married    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    No guns, wears seat belt, safe in relationship  Vegan x 8-9 years as of 07/06/20    Social Drivers of Health   Financial Resource Strain: Low Risk  (04/16/2023)   Overall Financial Resource Strain (CARDIA)    Difficulty of Paying Living Expenses: Not hard at all  Food Insecurity: No Food Insecurity (04/16/2023)   Hunger Vital Sign    Worried About Running Out of Food in the Last Year: Never true    Ran Out of Food in the Last Year: Never true  Transportation Needs: No Transportation Needs (04/16/2023)   PRAPARE - Administrator, Civil Service (Medical): No    Lack of Transportation (Non-Medical): No  Physical Activity: Unknown (04/16/2023)   Exercise Vital Sign    Days of Exercise per Week: 0 days    Minutes of Exercise per Session: Not on file  Stress: No Stress  Concern Present (04/16/2023)   [name]    Feeling of Stress : Not at all  Social Connections: Moderately Integrated (04/16/2023)   Social Connection and Isolation Panel [NHANES]    Frequency of Communication with Friends and Family: Twice a week    Frequency of Social Gatherings with Friends and Family: Twice a week    Attends Religious Services: 1 to 4 times per year    Active Member of [employer] or Organizations: No    Attends Banker Meetings: Never    Marital Status: Married    Review of Systems Denies any N/V, chest pain, shortness of breath, fevers/chills. All other ROS negative.  Vital Signs: BP 114/78   Temp 97.9 F (36.6 C) (Oral)   Resp 19   Ht 5\' 11"  (1.803 m)   Wt 233 lb (105.7 kg)   SpO2 96%   BMI 32.50 kg/m    Physical Exam Vitals reviewed.  Constitutional:      Appearance: Normal appearance.  HENT:     Head: Normocephalic and atraumatic.     Mouth/Throat:     Mouth: Mucous membranes are moist.     Pharynx: Oropharynx is clear.  Cardiovascular:     Rate and Rhythm: Normal rate and regular rhythm.     Heart sounds: Normal heart sounds.  Pulmonary:     Effort: Pulmonary effort is normal.     Breath sounds: Normal breath sounds.  Abdominal:     General: Abdomen is flat.     Palpations: Abdomen is soft.     Tenderness: There is no abdominal tenderness.  Musculoskeletal:        General: Normal range of motion.     Cervical back: Normal range of motion.  Skin:    General: Skin is warm.  Neurological:     General: No focal deficit present.     Mental Status: He is alert and oriented to person, place, and time. Mental status is at baseline.  Psychiatric:        Mood and Affect: Mood normal.        Behavior: Behavior normal.        Judgment: Judgment normal.     Imaging: DG Bone Survey Met Result Date: 05/14/2023 CLINICAL DATA:  Multiple myeloma EXAM: METASTATIC BONE SURVEY  COMPARISON:  None Available. FINDINGS: Twenty-one images are submitted. Heterogeneity of the calvarium with small lucencies that are innumerable and generalized. Under penetrated chest view, no discrete rib lesion or healing rib fracture. In the upper extremities, there is a convincing 11 mm lesion in the right humeral shaft and a well-defined  lucency at the base of the left acromion of similar size. Prominent osteopenia involving the shafts of the bilateral humerus with some areas of lower humeral shaft endosteal scalloping marked on the images that could reflect underlying lesions. Similar degree of osteopenia and radial endosteal scalloping in the bilateral forearm, although no discrete lesion. No discrete lesion or compression fracture seen in the spine or bony pelvis. In the legs, there is osteopenia with some endosteal scalloping especially at the bilateral fibular shaft. Asymmetric medullary lucency at the lower femoral shaft on the left without discrete measurable lesion. Remote and healed right fibular shaft fracture. Curvilinear metallic density at the lower medial left thigh The bowel gas pattern is normal. There are bands of opacity in the lower lungs with linear shape favoring scarring. There is an implantable loop recorder and cholecystectomy clips. IMPRESSION: Osteopenia with lucent areas in the extremities and calvarium in keeping with history of myeloma. Electronically Signed   By: Tiburcio Pea M.D.   On: 05/14/2023 09:36    Labs:  CBC: Recent Labs    03/26/23 1535 04/16/23 1208 05/18/23 0737  WBC 7.6 8.2 8.7  HGB 13.2 13.2 13.0  HCT 39.3 39.6 38.8*  PLT 211.0 198 199    COAGS: No results for input(s): "INR", "APTT" in the last 8760 hours.  BMP: Recent Labs    03/26/23 1535 04/16/23 1208  NA 139 135  K 4.3 4.0  CL 103 99  CO2 27 26  GLUCOSE 93 95  BUN 7 7*  CALCIUM 9.1 9.4  CREATININE 0.78 0.74  GFRNONAA  --  >60    LIVER FUNCTION TESTS: Recent Labs     03/26/23 1535 04/16/23 1208  BILITOT 0.5 0.8  AST 19 25  ALT 20 29  ALKPHOS 83 73  PROT 6.0*  6.0 6.4*  ALBUMIN 4.2 4.0    TUMOR MARKERS: No results for input(s): "AFPTM", "CEA", "CA199", "CHROMGRNA" in the last 8760 hours.  Assessment and Plan:  68 y.o. male with a history of A fib and HLD who initially presented to his PCP w/ complaints of progressive muscle fatigue, including proximal muscle weakness of upper and lower extremities. Workup revealed abnormal urine M protein leading to subsequent hem/onc referral. Further evaluation with oncology revealed an abnormal SPEP w/ light chain monoclonal gammopathy. Patient referred to IR for bone marrow biopsy.   Plan for bone marrow biopsy with Dr. Deanne Coffer on 05/18/23  Risks and benefits of bone marrow biopsy was discussed with the patient and/or patient's family including, but not limited to bleeding, infection, damage to adjacent structures or low yield requiring additional tests.  All of the questions were answered and there is agreement to proceed.  Consent signed and in chart.   Thank you for this interesting consult. I greatly enjoyed meeting Jimmy [name] and look forward to participating in their care. A copy of this report was sent to the requesting provider on this date.  Electronically Signed: Jama Flavors, PA-C 05/18/2023, 8:25 AM   I spent a total of  30 Minutes in face to face clinical consultation, greater than 50% of which was counseling/coordinating care for bone marrow biopsy.

## 2023-05-18 ENCOUNTER — Encounter: Payer: Self-pay | Admitting: Radiology

## 2023-05-18 ENCOUNTER — Other Ambulatory Visit: Payer: Self-pay

## 2023-05-18 ENCOUNTER — Ambulatory Visit
Admission: RE | Admit: 2023-05-18 | Discharge: 2023-05-18 | Disposition: A | Discharge status: Home / Self Care | Payer: Medicare Other | Source: Ambulatory Visit | Attending: Internal Medicine | Admitting: Internal Medicine

## 2023-05-18 DIAGNOSIS — M6281 Muscle weakness (generalized): Secondary | ICD-10-CM | POA: Diagnosis not present

## 2023-05-18 DIAGNOSIS — C9000 Multiple myeloma not having achieved remission: Secondary | ICD-10-CM | POA: Insufficient documentation

## 2023-05-18 DIAGNOSIS — E785 Hyperlipidemia, unspecified: Secondary | ICD-10-CM | POA: Insufficient documentation

## 2023-05-18 DIAGNOSIS — I4891 Unspecified atrial fibrillation: Secondary | ICD-10-CM | POA: Diagnosis not present

## 2023-05-18 DIAGNOSIS — D472 Monoclonal gammopathy: Secondary | ICD-10-CM | POA: Diagnosis present

## 2023-05-18 DIAGNOSIS — R778 Other specified abnormalities of plasma proteins: Secondary | ICD-10-CM

## 2023-05-18 HISTORY — PX: IR BONE MARROW BIOPSY & ASPIRATION: IMG5727

## 2023-05-18 LAB — CBC WITH DIFFERENTIAL/PLATELET
Abs Immature Granulocytes: 0.02 10*3/uL (ref 0.00–0.07)
Basophils Absolute: 0.1 10*3/uL (ref 0.0–0.1)
Basophils Relative: 1 %
Eosinophils Absolute: 0.4 10*3/uL (ref 0.0–0.5)
Eosinophils Relative: 4 %
HCT: 38.8 % — ABNORMAL LOW (ref 39.0–52.0)
Hemoglobin: 13 g/dL (ref 13.0–17.0)
Immature Granulocytes: 0 %
Lymphocytes Relative: 23 %
Lymphs Abs: 2 10*3/uL (ref 0.7–4.0)
MCH: 30.7 pg (ref 26.0–34.0)
MCHC: 33.5 g/dL (ref 30.0–36.0)
MCV: 91.7 fL (ref 80.0–100.0)
Monocytes Absolute: 0.8 10*3/uL (ref 0.1–1.0)
Monocytes Relative: 9 %
Neutro Abs: 5.5 10*3/uL (ref 1.7–7.7)
Neutrophils Relative %: 63 %
Platelets: 199 10*3/uL (ref 150–400)
RBC: 4.23 MIL/uL (ref 4.22–5.81)
RDW: 15.3 % (ref 11.5–15.5)
WBC: 8.7 10*3/uL (ref 4.0–10.5)
nRBC: 0 % (ref 0.0–0.2)

## 2023-05-18 MED ORDER — LIDOCAINE 1 % OPTIME INJ - NO CHARGE
10.0000 mL | Freq: Once | INTRAMUSCULAR | Status: AC
  Administered 2023-05-18: 10 mL via INTRADERMAL
  Filled 2023-05-18: qty 10

## 2023-05-18 MED ORDER — MIDAZOLAM HCL 2 MG/2ML IJ SOLN
INTRAMUSCULAR | Status: AC
  Filled 2023-05-18: qty 2

## 2023-05-18 MED ORDER — SODIUM CHLORIDE 0.9 % IV SOLN
INTRAVENOUS | Status: DC
Start: 2023-05-18 — End: 2023-05-19

## 2023-05-18 MED ORDER — MIDAZOLAM HCL 2 MG/2ML IJ SOLN
INTRAMUSCULAR | Status: AC | PRN
  Administered 2023-05-18 (×2): 1 mg via INTRAVENOUS

## 2023-05-18 MED ORDER — FENTANYL CITRATE (PF) 100 MCG/2ML IJ SOLN
INTRAMUSCULAR | Status: AC | PRN
  Administered 2023-05-18 (×2): 50 ug via INTRAVENOUS

## 2023-05-18 MED ORDER — FENTANYL CITRATE (PF) 100 MCG/2ML IJ SOLN
INTRAMUSCULAR | Status: AC
  Filled 2023-05-18: qty 2

## 2023-05-18 MED ORDER — HEPARIN SOD (PORK) LOCK FLUSH 100 UNIT/ML IV SOLN
INTRAVENOUS | Status: AC
  Filled 2023-05-18: qty 5

## 2023-05-18 NOTE — Procedures (Signed)
  Procedure:  FLuoro guided bone marrow biopsy R iliac Preprocedure diagnosis: The encounter diagnosis was Abnormal SPEP. Postprocedure diagnosis: same EBL:    minimal Complications:   none immediate  See full dictation in [employer].  Thora Lance MD Main # [phone] Pager  [phone] Mobile [phone]

## 2023-05-18 NOTE — Progress Notes (Signed)
Patient clinically stable post IR BMB per Dr Deanne Coffer. Vitals stable pre and post procedure. Denies complaints post procedure. Received Versed 2 mg along with Fentanyl 100 mcg iV for procedure. Report given to Orlinda Blalock RN post procedure./specials/12.

## 2023-05-18 NOTE — Discharge Instructions (Signed)
Bone Marrow Aspiration and Bone Marrow Biopsy, Adult, Care After This sheet gives you information about how to care for yourself after your procedure. If you have problems or questions, contact your health care provider.  What can I expect after the procedure?  After the procedure, it is common to have: Mild pain and tenderness. Swelling. Bruising.  Follow these instructions at home: Take over-the-counter or prescription medicines only as told by your health care provider. You may shower tomorrow Remove band aid tomorrow, replace with another bandaid if  site has any drainage from biopsy site. Wash your hands with soap and water before you touch your biopsy site  If soap and water are not available, use hand sanitizer. Change your dressing frequently for bleeding and/or drainage. Check your puncture site every day for signs of infection. Check for: More redness, swelling, or pain. More fluid or blood. Warmth. Pus or a bad smell. Return to your normal activities in 24hours.  Do not drive for 24 hours if you were given a medicine to help you relax (sedative). Keep all follow-up visits as told by your health care provider. This is important. Contact a health care provider if: You have more redness, swelling, or pain around the puncture site. You have more fluid or blood coming from the puncture site. Your puncture site feels warm to the touch. You have pus or a bad smell coming from the puncture site. You have a fever. Your pain is not controlled with medicine. This information is not intended to replace advice given to you by your health care provider. Make sure you discuss any questions you have with your health care provider. Document Released: 10/07/2004 Document Revised: 10/08/2015 Document Reviewed: 09/01/2015 Elsevier Interactive Patient Education  2018 [employer].

## 2023-05-21 ENCOUNTER — Ambulatory Visit (INDEPENDENT_AMBULATORY_CARE_PROVIDER_SITE_OTHER): Payer: Medicare Other | Admitting: Family Medicine

## 2023-05-21 ENCOUNTER — Encounter: Payer: Self-pay | Admitting: Family Medicine

## 2023-05-21 VITALS — BP 98/60 | HR 85 | Temp 97.9°F | Resp 18 | Ht 71.0 in | Wt 236.1 lb

## 2023-05-21 DIAGNOSIS — Z8679 Personal history of other diseases of the circulatory system: Secondary | ICD-10-CM

## 2023-05-21 DIAGNOSIS — Z0000 Encounter for general adult medical examination without abnormal findings: Secondary | ICD-10-CM

## 2023-05-21 DIAGNOSIS — Z13220 Encounter for screening for lipoid disorders: Secondary | ICD-10-CM

## 2023-05-21 DIAGNOSIS — E785 Hyperlipidemia, unspecified: Secondary | ICD-10-CM

## 2023-05-21 DIAGNOSIS — R351 Nocturia: Secondary | ICD-10-CM | POA: Diagnosis not present

## 2023-05-21 DIAGNOSIS — Z9889 Other specified postprocedural states: Secondary | ICD-10-CM

## 2023-05-21 DIAGNOSIS — N62 Hypertrophy of breast: Secondary | ICD-10-CM

## 2023-05-21 DIAGNOSIS — R778 Other specified abnormalities of plasma proteins: Secondary | ICD-10-CM

## 2023-05-21 LAB — PSA: PSA: 1.97 ng/mL (ref 0.10–4.00)

## 2023-05-21 LAB — LIPID PANEL
Cholesterol: 173 mg/dL (ref 0–200)
HDL: 31.7 mg/dL — ABNORMAL LOW (ref >39.00)
LDL Cholesterol: 113 mg/dL — ABNORMAL HIGH (ref 0–99)
NonHDL: 140.94
Total CHOL/HDL Ratio: 5
Triglycerides: 141 mg/dL (ref 0.0–149.0)
VLDL: 28.2 mg/dL (ref 0.0–40.0)

## 2023-05-21 NOTE — Patient Instructions (Signed)
 It was a pleasure meeting you today. Thank you for allowing me to take part in your health care.  Our goals for today as we discussed include:  We will get some labs today.  If they are abnormal or we need to do something about them, I will call you.  If they are normal, I will send you a message on MyChart (if it is active) or a letter in the mail.  If you don't hear from [place] in 2 weeks, please call the office at the number below.    This is a list of the screening recommended for you and due dates:  Health Maintenance  Topic Date Due   COVID-19 Vaccine (3 - 2024-25 season) 12/03/2022   Flu Shot  07/02/2023*   Zoster (Shingles) Vaccine (2 of 2) 06/28/2023   Medicare Annual Wellness Visit  04/15/2024   Cologuard (Stool DNA test)  04/22/2026   DTaP/Tdap/Td vaccine (2 - Td or Tdap) 06/20/2028   Pneumonia Vaccine  Completed   Hepatitis C Screening  Completed   HPV Vaccine  Aged Out  *Topic was postponed. The date shown is not the original due date.     If you have any questions or concerns, please do not hesitate to call the office at [phone].  I look forward to our next visit and until then take care and stay safe.  Regards,   Dana Allan, MD   [facility]

## 2023-05-21 NOTE — Assessment & Plan Note (Signed)
 Recent bone marrow biopsy performed, awaiting results. Patient has abnormal free light chains and kappa/lambda ratio. Patient is considering evaluation at [facility] specialty center for multiple myeloma. -Continue monitoring with oncology and await biopsy results.

## 2023-05-21 NOTE — Progress Notes (Signed)
 SUBJECTIVE:   Chief Complaint  Patient presents with   Annual Exam   HPI Presents for annual physical   Discussed the use of AI scribe software for clinical note transcription with the patient, who gave verbal consent to proceed.  History of Present Illness UTAH DELAUDER "Jimmy [name]" is a 68 year old male with multiple myeloma who presents for an annual physical exam.  He denies any chest pain, shortness of breath, or swelling in his legs. No issues with bowel or bladder function. He is taking vitamin D but cannot recall the last time his vitamin D levels were checked.  He has a history of multiple myeloma and is currently undergoing evaluation and treatment for this condition. He recently had a bone marrow biopsy performed last Friday and is awaiting results. The condition is slowly progressing.  He is currently on hormone therapy and has reduced his estrogen patch to one due to the potential upcoming cancer treatment. He is interested in monitoring his testosterone and estradiol levels, as he suspects his testosterone might be elevated. He has been experiencing some symptoms that suggest this, but he is not concerned about them. He is not currently on testosterone therapy.  He has been on amiodarone 200 mg daily for the past two weeks and is scheduled to meet with his cardiothoracic surgeon in March. He is aware of the potential effects of amiodarone on thyroid function and has been monitoring his iodine intake.  He has been active, walking two miles without stopping, and notes improvement in his muscle endurance.  He reports no family history of bone or blood-related disorders. His father had invasive lung cancer, which was discovered during a second triple bypass surgery.    Review of Systems - Negative except listed above      PERTINENT PMH / PSH: As above  OBJECTIVE:  BP 98/60   Pulse 85   Temp 97.9 F (36.6 C)   Resp 18   Ht 5\' 11"  (1.803 m)   Wt 236 lb 2 oz (107.1  kg)   SpO2 98%   BMI 32.93 kg/m    Physical Exam Vitals reviewed.  Constitutional:      General: He is not in acute distress.    Appearance: Normal appearance. He is obese. He is not ill-appearing, toxic-appearing or diaphoretic.  HENT:     Right Ear: Tympanic membrane, ear canal and external ear normal.     Left Ear: Tympanic membrane, ear canal and external ear normal.  Eyes:     General:        Right eye: No discharge.        Left eye: No discharge.  Neck:     Thyroid: No thyromegaly or thyroid tenderness.  Cardiovascular:     Rate and Rhythm: Normal rate and regular rhythm.     Pulses: Normal pulses.     Heart sounds: Normal heart sounds.  Pulmonary:     Effort: Pulmonary effort is normal.     Breath sounds: Normal breath sounds.  Abdominal:     General: Bowel sounds are normal.  Musculoskeletal:        General: Normal range of motion.     Cervical back: Normal range of motion.     Right lower leg: No edema.     Left lower leg: No edema.  Skin:    General: Skin is warm and dry.  Neurological:     Mental Status: He is alert and oriented to person, place, and  time. Mental status is at baseline.  Psychiatric:        Mood and Affect: Mood normal.        Behavior: Behavior normal.        Thought Content: Thought content normal.        Judgment: Judgment normal.           05/21/2023    9:10 AM 04/16/2023    3:52 PM 04/16/2023   12:04 PM 03/26/2023    2:53 PM 05/17/2022    9:04 AM  Depression screen PHQ 2/9  Decreased Interest 0 0 0 0 0  Down, Depressed, Hopeless 0 0 0 0 0  PHQ - 2 Score 0 0 0 0 0  Altered sleeping 0 0 0 0   Tired, decreased energy 0 0 0 0   Change in appetite 0 0 0 0   Feeling bad or failure about yourself  0 0 0 0   Trouble concentrating 0 0 0 0   Moving slowly or fidgety/restless 0 0 0 0   Suicidal thoughts 0 0 0 0   PHQ-9 Score 0 0 0 0   Difficult doing work/chores Not difficult at all   Not difficult at all       05/21/2023    9:10  AM 03/26/2023    2:53 PM 05/17/2022    9:04 AM 07/02/2019    8:32 AM  GAD 7 : Generalized Anxiety Score  Nervous, Anxious, on Edge 1 0 0 0  Control/stop worrying 0 0 0 0  Worry too much - different things 0 0 0 0  Trouble relaxing 0 0 0 0  Restless 0 0 0 0  Easily annoyed or irritable 0 0 0 0  Afraid - awful might happen 0 0 0 0  Total GAD 7 Score 1 0 0 0  Anxiety Difficulty Not difficult at all Not difficult at all Not difficult at all Not difficult at all    ASSESSMENT/PLAN:  Annual physical exam Assessment & Plan: Cologuard up to date. Negative.  Next due 2028 Check lipids today Check PSA Normotensive PHQ9/GAD 7 negative Declined second shingles vaccine. Previously had reaction Pneumonia series completed Declined Flu vaccine Follow up annually      Nocturia -     PSA  Gynecomastia, male Assessment & Plan: Patient on estrogen patch therapy, recently reduced from two patches to one due to potential upcoming cancer treatment. Patient wishes to monitor testosterone and estradiol levels to assess response to reduced estrogen therapy. -Order testosterone and estradiol levels.  Orders: -     Testosterone,Free and Total -     Estradiol  Hyperlipidemia, unspecified hyperlipidemia type Assessment & Plan: Patient fasted for today's visit, cholesterol levels need to be checked. -Order lipid panel. -The 10-year ASCVD risk score (Arnett DK, et al., 2019) is: 9.9%     Orders: -     Lipid panel  S/P Maze operation for atrial fibrillation Assessment & Plan: Patient recently started on Amiodarone 200mg  daily for cardiac issues. Patient has a follow-up with a cardiologist in [place] scheduled. -Continue Amiodarone as prescribed and follow-up with cardiologist.   Abnormal SPEP Assessment & Plan: Recent bone marrow biopsy performed, awaiting results. Patient has abnormal free light chains and kappa/lambda ratio. Patient is considering evaluation at [facility] specialty center for  multiple myeloma. -Continue monitoring with oncology and await biopsy results.      PDMP reviewed  Return if symptoms worsen or fail to improve, for PCP.  Dana Allan,  MD

## 2023-05-21 NOTE — Assessment & Plan Note (Signed)
 Patient on estrogen patch therapy, recently reduced from two patches to one due to potential upcoming cancer treatment. Patient wishes to monitor testosterone and estradiol levels to assess response to reduced estrogen therapy. -Order testosterone and estradiol levels.

## 2023-05-21 NOTE — Assessment & Plan Note (Signed)
 Patient fasted for today's visit, cholesterol levels need to be checked. -Order lipid panel. -The 10-year ASCVD risk score (Arnett DK, et al., 2019) is: 9.9%

## 2023-05-21 NOTE — Assessment & Plan Note (Signed)
 Patient recently started on Amiodarone 200mg  daily for cardiac issues. Patient has a follow-up with a cardiologist in [place] scheduled. -Continue Amiodarone as prescribed and follow-up with cardiologist.

## 2023-05-21 NOTE — Assessment & Plan Note (Signed)
 Cologuard up to date. Negative.  Next due 2028 Check lipids today Check PSA Normotensive PHQ9/GAD 7 negative Declined second shingles vaccine. Previously had reaction Pneumonia series completed Declined Flu vaccine Follow up annually

## 2023-05-22 ENCOUNTER — Encounter: Payer: Self-pay | Admitting: Internal Medicine

## 2023-05-22 ENCOUNTER — Telehealth: Payer: Self-pay

## 2023-05-22 LAB — ESTRADIOL: Estradiol: 113 pg/mL — ABNORMAL HIGH (ref <=39)

## 2023-05-22 NOTE — Telephone Encounter (Signed)
-----   Message from Dana Allan sent at 05/22/2023  3:50 PM EST ----- Testosterone normal, Free testosterone pending.  Estradilol level has decreased  Prostate level normal  LDL, bad cholesterol has decreased

## 2023-05-22 NOTE — Telephone Encounter (Signed)
 Left message to return call to our office.  Okay to read results notes. Please document when pt is spoke to.

## 2023-05-24 ENCOUNTER — Telehealth: Payer: Self-pay | Admitting: Internal Medicine

## 2023-05-24 ENCOUNTER — Ambulatory Visit: Payer: Medicare Other | Admitting: Internal Medicine

## 2023-05-24 DIAGNOSIS — C9002 Multiple myeloma in relapse: Secondary | ICD-10-CM

## 2023-05-24 DIAGNOSIS — R778 Other specified abnormalities of plasma proteins: Secondary | ICD-10-CM

## 2023-05-24 NOTE — Telephone Encounter (Signed)
 I spoke to patient regarding results of the bone marrow biopsy concerning for multiple myeloma.  Recommend further workup with a PET scan ASAP.  Please schedule  Also ordered beta-2 microglobulin-to be drawn at next visit; please schedule lab at next visit.  GB

## 2023-05-25 ENCOUNTER — Telehealth: Payer: Self-pay | Admitting: *Deleted

## 2023-05-25 ENCOUNTER — Telehealth: Payer: Self-pay | Admitting: Internal Medicine

## 2023-05-25 ENCOUNTER — Encounter: Payer: Self-pay | Admitting: Family Medicine

## 2023-05-25 LAB — TESTOSTERONE,FREE AND TOTAL
Testosterone, Free: 3.9 pg/mL — ABNORMAL LOW (ref 6.6–18.1)
Testosterone: 813 ng/dL (ref 264–916)

## 2023-05-25 NOTE — Telephone Encounter (Signed)
 Fax to pathology re: adding congo stain on bone marrow biopsy - Surgical Pathology  CASE: [id number]   GB

## 2023-05-25 NOTE — Telephone Encounter (Signed)
 Called and spoke with Joni Reining at [facility] and requested to add congo stain to biopsy case [id number].  Stated that pathologist who resulted would have to add and he is out until 05/30/23.  Request would be added on that date when he returns.  RN verbalized understanding, Dr Donneta Romberg notified and request also sent via fax to [facility] at [phone].

## 2023-05-29 ENCOUNTER — Ambulatory Visit
Admission: RE | Admit: 2023-05-29 | Discharge: 2023-05-29 | Disposition: A | Discharge status: Home / Self Care | Payer: Medicare Other | Source: Ambulatory Visit | Attending: Internal Medicine | Admitting: Internal Medicine

## 2023-05-29 ENCOUNTER — Encounter (HOSPITAL_COMMUNITY): Payer: Self-pay | Admitting: Internal Medicine

## 2023-05-29 DIAGNOSIS — R778 Other specified abnormalities of plasma proteins: Secondary | ICD-10-CM | POA: Diagnosis not present

## 2023-05-29 DIAGNOSIS — C9002 Multiple myeloma in relapse: Secondary | ICD-10-CM | POA: Diagnosis present

## 2023-05-29 DIAGNOSIS — I700 Atherosclerosis of aorta: Secondary | ICD-10-CM | POA: Diagnosis not present

## 2023-05-29 DIAGNOSIS — K5730 Diverticulosis of large intestine without perforation or abscess without bleeding: Secondary | ICD-10-CM | POA: Insufficient documentation

## 2023-05-29 DIAGNOSIS — M899 Disorder of bone, unspecified: Secondary | ICD-10-CM | POA: Diagnosis not present

## 2023-05-29 LAB — GLUCOSE, CAPILLARY: Glucose-Capillary: 86 mg/dL (ref 70–99)

## 2023-05-29 MED ORDER — FLUDEOXYGLUCOSE F - 18 (FDG) INJECTION
11.9900 | Freq: Once | INTRAVENOUS | Status: AC | PRN
  Administered 2023-05-29: 11.99 via INTRAVENOUS

## 2023-05-30 LAB — SURGICAL PATHOLOGY

## 2023-06-04 ENCOUNTER — Ambulatory Visit: Payer: Medicare Other | Admitting: Internal Medicine

## 2023-06-05 ENCOUNTER — Inpatient Hospital Stay: Payer: Medicare Other

## 2023-06-05 ENCOUNTER — Telehealth: Payer: Self-pay | Admitting: *Deleted

## 2023-06-05 ENCOUNTER — Inpatient Hospital Stay: Payer: Medicare Other | Attending: Internal Medicine | Admitting: Internal Medicine

## 2023-06-05 ENCOUNTER — Encounter: Payer: Self-pay | Admitting: Internal Medicine

## 2023-06-05 VITALS — BP 106/80 | HR 100 | Temp 97.5°F | Resp 16 | Ht 71.0 in | Wt 229.6 lb

## 2023-06-05 DIAGNOSIS — R778 Other specified abnormalities of plasma proteins: Secondary | ICD-10-CM

## 2023-06-05 DIAGNOSIS — C9002 Multiple myeloma in relapse: Secondary | ICD-10-CM | POA: Diagnosis not present

## 2023-06-05 DIAGNOSIS — Z79899 Other long term (current) drug therapy: Secondary | ICD-10-CM | POA: Insufficient documentation

## 2023-06-05 DIAGNOSIS — N62 Hypertrophy of breast: Secondary | ICD-10-CM | POA: Diagnosis not present

## 2023-06-05 NOTE — Progress Notes (Signed)
 [facility] CONSULT NOTE  Patient Care Team: Dana Allan, MD as PCP - General (Family Medicine) Earna Coder, MD as Consulting Physician (Oncology)  CHIEF COMPLAINTS/PURPOSE OF CONSULTATION: Monoclonal gammopathy  Oncology History Overview Note  # LIGHT CHAIN MONOCLONAL GAMMOPATHY -DEC 2024-[PCP-SPEP-NEGATIVE; Hb 13; GFR-> 60 ca-WN; However, random UPEP- positive for M protein/Bence-Jones- Protein positive; kappa type.  JAN 2025- SPEP- NEG; K/l= 91 [K=400]. Dx= includes smoldering MM vs active MM. 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  JAN 2025- SPEP- NEG; K/l= 91 [K=400].  EXOGENOUS ESTROGENS: Estradiol for 7-9 years   Multiple myeloma in relapse (HCC)  06/05/2023 Initial Diagnosis   Multiple myeloma in relapse (HCC)      HEMATOLOGY HISTORY:   HISTORY OF PRESENTING ILLNESS: Patient ambulating-independently. Accompanied by wife.   ETHON WYMER 68 y.o.  male is here for follow up and to review the results up for monoclonal gammopathy- PET scan/ bone marrow biopsy results.   Patient continues to complain of on going fatigue.  Otherwise denies any tingling and numbness in the extremities.  No falls.   Review of Systems  Constitutional:  Positive for malaise/fatigue. Negative for chills, diaphoresis, fever and weight loss.  HENT:  Negative for nosebleeds and sore throat.   Eyes:  Negative for double vision.  Respiratory:  Negative for cough, hemoptysis, sputum production, shortness of breath and wheezing.   Cardiovascular:  Negative for chest pain, palpitations,  orthopnea and leg swelling.  Gastrointestinal:  Negative for abdominal pain, blood in stool, constipation, diarrhea, heartburn, melena, nausea and vomiting.  Genitourinary:  Negative for dysuria, frequency and urgency.  Musculoskeletal:  Negative for back pain and joint pain.  Skin: Negative.  Negative for itching and rash.  Neurological:  Negative for dizziness, tingling, focal weakness, weakness and headaches.  Endo/Heme/Allergies:  Does not bruise/bleed easily.  Psychiatric/Behavioral:  Negative for depression. The patient is not nervous/anxious and does not have insomnia.     MEDICAL HISTORY:  Past Medical History:  Diagnosis Date   Atrial fibrillation (HCC)    had loop recorder, PAcs after DCCV 09/2019   Atrial flutter (HCC)    HLD (hyperlipidemia)    On amiodarone therapy 09/15/2019   Pain of right lower leg 01/02/2022    SURGICAL HISTORY: Past Surgical History:  Procedure Laterality Date   CARDIOVERSION     2021 for Afib in [place]   CHOLECYSTECTOMY     IR BONE MARROW BIOPSY & ASPIRATION  05/18/2023   MINIMALLY INVASIVE MAZE PROCEDURE     done in [place] Dr R. Sheppard Penton   stye Right 09/2022    SOCIAL HISTORY: Social History   Socioeconomic History   Marital status: Married    Spouse name: Not on file   Number of children: Not on file   Years of education: Not on file   Highest education level: Bachelor's degree (e.g., BA, AB, BS)  Occupational History   Not on file  Tobacco Use   Smoking status: Never   Smokeless tobacco: Never  Vaping Use   Vaping status: Never Used  Substance and Sexual Activity   Alcohol use: Never   Drug use:  Not Currently   Sexual activity: Not Currently  Other Topics Concern   Not on file  Social History Narrative   2 sons and 1 daughter    Married    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    No guns, wears seat belt, safe in relationship   Vegan x 8-9 years as of 07/06/20    Social Drivers of Corporate investment banker Strain:  Low Risk  (04/16/2023)   Overall Financial Resource Strain (CARDIA)    Difficulty of Paying Living Expenses: Not hard at all  Food Insecurity: No Food Insecurity (04/16/2023)   Hunger Vital Sign    Worried About Running Out of Food in the Last Year: Never true    Ran Out of Food in the Last Year: Never true  Transportation Needs: No Transportation Needs (04/16/2023)   PRAPARE - Administrator, Civil Service (Medical): No    Lack of Transportation (Non-Medical): No  Physical Activity: Unknown (04/16/2023)   Exercise Vital Sign    Days of Exercise per Week: 0 days    Minutes of Exercise per Session: Not on file  Stress: No Stress Concern Present (04/16/2023)   [name]    Feeling of Stress : Not at all  Social Connections: Moderately Integrated (04/16/2023)   Social Connection and Isolation Panel [NHANES]    Frequency of Communication with Friends and Family: Twice a week    Frequency of Social Gatherings with Friends and Family: Twice a week    Attends Religious Services: 1 to 4 times per year    Active Member of [employer] or Organizations: No    Attends Banker Meetings: Never    Marital Status: Married  Catering manager Violence: Not At Risk (04/16/2023)   Humiliation, Afraid, Rape, and Kick questionnaire    Fear of Current or Ex-Partner: No    Emotionally Abused: No    Physically Abused: No    Sexually Abused: No    FAMILY HISTORY: Family History  Problem Relation Age of Onset   COPD Mother    Atrial fibrillation Mother        ? due to Afib died 70   Cancer Father        lung cancer smoker age 19    Heart disease Father        bypass at 68    Atrial fibrillation Sister     ALLERGIES:  has no known allergies.  MEDICATIONS:  Current Outpatient Medications  Medication Sig Dispense Refill   amiodarone (PACERONE) 200 MG tablet Take 200 mg by mouth daily.     CALCIUM-MAGNESUIUM-ZINC  333-133-8.3 MG TABS 1 capsule once daily     Cholecalciferol (VITAMIN D3) 50 MCG (2000 UT) capsule Take 2,000 Units by mouth daily.     cyanocobalamin 1000 MCG tablet Take 2,000 mcg by mouth.     estradiol (VIVELLE-DOT) 0.1 MG/24HR patch Place 1 patch onto the skin. Q3.5 days     Multiple Vitamins-Minerals (MULTIVITAMIN WOMEN PO) Take by mouth daily.     Omega-3 1400 MG CAPS      Turmeric (CURCUMIN 95) 500 MG CAPS Take 500 mg by mouth 2 (two) times daily.     No current facility-administered medications for this visit.   PHYSICAL EXAMINATION:   Vitals:   06/05/23 0914  BP: 106/80  Pulse: 100  Resp: 16  Temp: (!) 97.5 F (36.4 C)  SpO2: 96%   Filed Weights   06/05/23 0914  Weight: 229 lb 9.6 oz (104.1 kg)   Positive for bilateral gynecomastia.   Physical Exam Vitals and nursing note reviewed.  HENT:     Head: Normocephalic and atraumatic.     Mouth/Throat:     Pharynx: Oropharynx is clear.  Eyes:     Extraocular Movements: Extraocular movements intact.     Pupils: Pupils are equal, round, and reactive to light.  Cardiovascular:     Rate and Rhythm: Normal rate and regular rhythm.  Abdominal:     Palpations: Abdomen is soft.  Musculoskeletal:        General: Normal range of motion.     Cervical back: Normal range of motion.  Skin:    General: Skin is warm.  Neurological:     General: No focal deficit present.     Mental Status: He is alert and oriented to person, place, and time.  Psychiatric:        Behavior: Behavior normal.        Judgment: Judgment normal.     LABORATORY DATA:  I have reviewed the data as listed Lab Results  Component Value Date   WBC 8.7 05/18/2023   HGB 13.0 05/18/2023   HCT 38.8 (L) 05/18/2023   MCV 91.7 05/18/2023   PLT 199 05/18/2023   Recent Labs    03/26/23 1535 04/16/23 1208  NA 139 135  K 4.3 4.0  CL 103 99  CO2 27 26  GLUCOSE 93 95  BUN 7 7*  CREATININE 0.78 0.74  CALCIUM 9.1 9.4  GFRNONAA  --  >60  PROT 6.0*   6.0 6.4*  ALBUMIN 4.2 4.0  AST 19 25  ALT 20 29  ALKPHOS 83 73  BILITOT 0.5 0.8   IR BONE MARROW BIOPSY & ASPIRATION Result Date: 05/18/2023 CLINICAL DATA:  Monoclonal gammopathy, abnormal SPEP EXAM: FLUOROSCOPIC GUIDED DEEP ILIAC BONE ASPIRATION AND CORE BIOPSY TECHNIQUE: Patient was placed prone on the fluoro table and limited images through the pelvis were obtained. Appropriate skin entry site was identified. Skin site was marked, prepped with chlorhexidine, draped in usual sterile fashion, and infiltrated locally with 1% lidocaine. Intravenous Fentanyl and Versed 2mg  were administered as conscious sedation during continuous monitoring of the patient's level of consciousness and physiological / cardiorespiratory status by the radiology RN, with a total moderate sedation time of 10 minutes. Under fluoroscopic guidance an 11-gauge Cook trocar bone needle was advanced into the right iliac bone just lateral to the sacroiliac joint. Once needle tip position was confirmed, core and aspiration samples were obtained, submitted to pathology for approval. Patient tolerated procedure well. COMPLICATIONS: COMPLICATIONS none IMPRESSION: 1. Technically successful fluoro guided right iliac bone core and aspiration biopsy. Electronically Signed   By: Corlis Leak M.D.   On: 05/18/2023 15:11    Lab Results  Component Value Date   KPAFRELGTCHN 439.1 (H) 04/16/2023   LAMBDASER 4.8 (L) 04/16/2023   KAPLAMBRATIO 91.48 (H) 04/16/2023     Multiple myeloma in relapse (HCC) # LIGHT CHAIN MONOCLONAL GAMMOPATHY -DEC 2024-[PCP-SPEP-NEGATIVE; Hb 13; GFR-> 60 ca-WN; However, random UPEP- positive for M protein/Bence-Jones- Protein positive; kappa type.  JAN 2025- SPEP- NEG; K/l= 91 [K=400]. Dx= includes smoldering MM vs active MM. FEB 2025- Bone marrow- BONE MARROW, ASPIRATE, CLOT, CORE:  -  Kappa restricted plasma cell neoplasm involving 50 to 90% of the  cellular marrow (patchy areas; average 70%) PET scan: pending-  cytogenetics: 11:14; 13 q- [  standard risk] Negative Congo stain.    # Discussed with patient and family that irrespective of the PET scan results-given the patient's bone marrow greater than 60% plasma cells involvement-would meet criteria for active multiple myeloma needing treatment.  Again pending PET scan results-given the absence of any endorgan damage I think is reasonable to wait until after the second opinion at [facility].  Per patient preference we will make a referral to [facility] for second opinion.    I discussed that the treatment options of multiple myeloma includes: Induction chemotherapy; followed by consolidation and maintenance therapy.  Consolidation also includes-autologous stem cell transplant.  Patient is a candidate for autologous stem cell transplant [age up to 65-75 y]. I also reviewed the process of autologous stem cell transplant process including but not limited to-stem cell harvesting; administration of high-dose chemotherapy followed by infusion of autologous stem cells.  This can be done both as inpatient or in outpatient setting. I will refer the patient to tertiary care center for further evaluation of autologous stem cell transplant- [facility].    # Dara-RVD Valentina Lucks trial/ [facility] clinical trial- MRD negativity (10?5) rate of 75%]; not enough data for HRC difference.    # I had a long discussion of the treatment options including-Dara- Rev-dex.  I had a lengthy discussion regarding the mechanism of action of each drug.  Discussed that Revlimid immunomodulatory; daratumumab is targeted therapy; dexamethasone-cytotoxic.   # Bilateral gynecomastia-secondary to exogenous estrogens  # History of a flutter [s/p ablation in [place]]-no anticoagulation; regular rhythm.   # Vaccination: discussed re: increased risk of infection- recommend vaccinations- including shingles.   # DISPOSITION: # referral [facility] re: Second opinion.  # Follow up TBD- Dr.B    All questions  were answered. The patient knows to call the clinic with any problems, questions or concerns.    Earna Coder, MD 06/05/2023 1:05 PM

## 2023-06-05 NOTE — Progress Notes (Signed)
 PET 05/29/23.  Pt states he is in atrial flutter today.

## 2023-06-05 NOTE — Telephone Encounter (Signed)
 Referral sent to Intake at [facility] for a 2nd opinion for newly diagnosed multiple myeloma.

## 2023-06-05 NOTE — Assessment & Plan Note (Addendum)
#   LIGHT CHAIN MONOCLONAL GAMMOPATHY -DEC 2024-[PCP-SPEP-NEGATIVE; Hb 13; GFR-> 60 ca-WN; However, random UPEP- positive for M protein/Bence-Jones- Protein positive; kappa type.  JAN 2025- SPEP- NEG; K/l= 91 [K=400]. Dx= includes smoldering MM vs active MM. 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    # Discussed with patient and family that irrespective of the PET scan results-given the patient's bone marrow greater than 60% plasma cells involvement-would meet criteria for active multiple myeloma needing treatment.  Again pending PET scan results-given the absence of any endorgan damage I think is reasonable to wait until after the second opinion at [facility].  Per patient preference we will make a referral to [facility] for second opinion.    I discussed that the treatment options of multiple myeloma includes: Induction chemotherapy; followed by consolidation and maintenance therapy.  Consolidation also includes-autologous stem cell transplant.  Patient is a candidate for autologous stem cell transplant [age up to 65-75 y]. I also reviewed the process of autologous stem cell transplant process including but not limited to-stem cell harvesting; administration of high-dose chemotherapy followed by infusion of autologous stem cells.  This can be done both as inpatient or in outpatient setting. I will refer the patient to tertiary care center for further evaluation of autologous stem cell transplant- [facility].    # Dara-RVD Valentina Lucks trial/ [facility] clinical trial- MRD negativity (10?5) rate of 75%]; not enough data for HRC difference.    # I had a long discussion of the treatment options including-Dara- Rev-dex.  I had a lengthy discussion regarding the mechanism of action of each drug.  Discussed that Revlimid  immunomodulatory; daratumumab is targeted therapy; dexamethasone-cytotoxic.   # Bilateral gynecomastia-secondary to exogenous estrogens  # History of a flutter [s/p ablation in [place]]-no anticoagulation; regular rhythm.   # Vaccination: discussed re: increased risk of infection- recommend vaccinations- including shingles.   # DISPOSITION: # referral [facility] Multiple Myeloma re: Second opinion.  # Follow up TBD- Dr.B

## 2023-06-06 ENCOUNTER — Other Ambulatory Visit: Payer: Self-pay | Admitting: Internal Medicine

## 2023-06-06 ENCOUNTER — Encounter: Payer: Self-pay | Admitting: Internal Medicine

## 2023-06-06 ENCOUNTER — Telehealth: Payer: Self-pay | Admitting: Internal Medicine

## 2023-06-06 ENCOUNTER — Other Ambulatory Visit: Payer: Self-pay

## 2023-06-06 ENCOUNTER — Telehealth: Payer: Self-pay | Admitting: *Deleted

## 2023-06-06 ENCOUNTER — Other Ambulatory Visit: Payer: Self-pay | Admitting: *Deleted

## 2023-06-06 DIAGNOSIS — C9002 Multiple myeloma in relapse: Secondary | ICD-10-CM

## 2023-06-06 LAB — BETA 2 MICROGLOBULIN, SERUM: Beta-2 Microglobulin: 2.5 mg/L — ABNORMAL HIGH (ref 0.6–2.4)

## 2023-06-06 NOTE — Telephone Encounter (Signed)
 Spoke to patient about the results of the PET scan that shows multiple lytic lesions however not avid.  Patient will keep appointment with [facility] as planned on April 9 will follow-up with [place] thereafter.  Will get labs 1st of April  GB

## 2023-06-06 NOTE — Telephone Encounter (Signed)
 Referral at [facility] with Dr Alvino Chapel  is scheduled July 11, 2023.

## 2023-06-06 NOTE — Telephone Encounter (Signed)
 Labs entered.

## 2023-07-03 ENCOUNTER — Encounter: Payer: Self-pay | Admitting: Internal Medicine

## 2023-07-03 ENCOUNTER — Inpatient Hospital Stay: Attending: Internal Medicine

## 2023-07-03 DIAGNOSIS — C9002 Multiple myeloma in relapse: Secondary | ICD-10-CM | POA: Insufficient documentation

## 2023-07-03 DIAGNOSIS — Z5112 Encounter for antineoplastic immunotherapy: Secondary | ICD-10-CM | POA: Diagnosis present

## 2023-07-03 LAB — CBC WITH DIFFERENTIAL (CANCER CENTER ONLY)
Abs Immature Granulocytes: 0.02 10*3/uL (ref 0.00–0.07)
Basophils Absolute: 0.1 10*3/uL (ref 0.0–0.1)
Basophils Relative: 1 %
Eosinophils Absolute: 0.4 10*3/uL (ref 0.0–0.5)
Eosinophils Relative: 5 %
HCT: 39.7 % (ref 39.0–52.0)
Hemoglobin: 13.4 g/dL (ref 13.0–17.0)
Immature Granulocytes: 0 %
Lymphocytes Relative: 25 %
Lymphs Abs: 2 10*3/uL (ref 0.7–4.0)
MCH: 31.1 pg (ref 26.0–34.0)
MCHC: 33.8 g/dL (ref 30.0–36.0)
MCV: 92.1 fL (ref 80.0–100.0)
Monocytes Absolute: 0.7 10*3/uL (ref 0.1–1.0)
Monocytes Relative: 8 %
Neutro Abs: 4.8 10*3/uL (ref 1.7–7.7)
Neutrophils Relative %: 61 %
Platelet Count: 171 10*3/uL (ref 150–400)
RBC: 4.31 MIL/uL (ref 4.22–5.81)
RDW: 14.8 % (ref 11.5–15.5)
WBC Count: 8 10*3/uL (ref 4.0–10.5)
nRBC: 0 % (ref 0.0–0.2)

## 2023-07-03 LAB — CMP (CANCER CENTER ONLY)
ALT: 24 U/L (ref 0–44)
AST: 20 U/L (ref 15–41)
Albumin: 3.8 g/dL (ref 3.5–5.0)
Alkaline Phosphatase: 78 U/L (ref 38–126)
Anion gap: 8 (ref 5–15)
BUN: 10 mg/dL (ref 8–23)
CO2: 27 mmol/L (ref 22–32)
Calcium: 9.3 mg/dL (ref 8.9–10.3)
Chloride: 103 mmol/L (ref 98–111)
Creatinine: 0.81 mg/dL (ref 0.61–1.24)
GFR, Estimated: >60 mL/min (ref >60)
Glucose, Bld: 119 mg/dL — ABNORMAL HIGH (ref 70–99)
Potassium: 3.7 mmol/L (ref 3.5–5.1)
Sodium: 138 mmol/L (ref 135–145)
Total Bilirubin: 0.7 mg/dL (ref 0.0–1.2)
Total Protein: 6.3 g/dL — ABNORMAL LOW (ref 6.5–8.1)

## 2023-07-04 LAB — KAPPA/LAMBDA LIGHT CHAINS
Kappa free light chain: 490.9 mg/L — ABNORMAL HIGH (ref 3.3–19.4)
Kappa, lambda light chain ratio: 111.57 — ABNORMAL HIGH (ref 0.26–1.65)
Lambda free light chains: 4.4 mg/L — ABNORMAL LOW (ref 5.7–26.3)

## 2023-07-06 LAB — MULTIPLE MYELOMA PANEL, SERUM
Albumin SerPl Elph-Mcnc: 3.4 g/dL (ref 2.9–4.4)
Albumin/Glob SerPl: 1.5 (ref 0.7–1.7)
Alpha 1: 0.2 g/dL (ref 0.0–0.4)
Alpha2 Glob SerPl Elph-Mcnc: 1 g/dL (ref 0.4–1.0)
B-Globulin SerPl Elph-Mcnc: 0.9 g/dL (ref 0.7–1.3)
Gamma Glob SerPl Elph-Mcnc: 0.2 g/dL — ABNORMAL LOW (ref 0.4–1.8)
Globulin, Total: 2.3 g/dL (ref 2.2–3.9)
IgA: 20 mg/dL — ABNORMAL LOW (ref 61–437)
IgG (Immunoglobin G), Serum: 310 mg/dL — ABNORMAL LOW (ref 603–1613)
IgM (Immunoglobulin M), Srm: 18 mg/dL — ABNORMAL LOW (ref 20–172)
Total Protein ELP: 5.7 g/dL — ABNORMAL LOW (ref 6.0–8.5)

## 2023-07-12 ENCOUNTER — Other Ambulatory Visit (HOSPITAL_BASED_OUTPATIENT_CLINIC_OR_DEPARTMENT_OTHER): Payer: Self-pay

## 2023-07-12 ENCOUNTER — Ambulatory Visit (HOSPITAL_BASED_OUTPATIENT_CLINIC_OR_DEPARTMENT_OTHER)
Admission: EM | Admit: 2023-07-12 | Discharge: 2023-07-12 | Disposition: A | Discharge status: Home / Self Care | Attending: Emergency Medicine | Admitting: Emergency Medicine

## 2023-07-12 ENCOUNTER — Encounter (HOSPITAL_BASED_OUTPATIENT_CLINIC_OR_DEPARTMENT_OTHER): Payer: Self-pay | Admitting: Emergency Medicine

## 2023-07-12 DIAGNOSIS — N3001 Acute cystitis with hematuria: Secondary | ICD-10-CM | POA: Diagnosis present

## 2023-07-12 LAB — POCT URINALYSIS DIP (MANUAL ENTRY)
Bilirubin, UA: NEGATIVE
Glucose, UA: NEGATIVE mg/dL
Ketones, POC UA: NEGATIVE mg/dL
Nitrite, UA: NEGATIVE
Spec Grav, UA: 1.015
Urobilinogen, UA: 0.2 U/dL
pH, UA: 7.5

## 2023-07-12 MED ORDER — SULFAMETHOXAZOLE-TRIMETHOPRIM 800-160 MG PO TABS
1.0000 | ORAL_TABLET | Freq: Two times a day (BID) | ORAL | 0 refills | Status: AC
  Filled 2023-07-12: qty 14, 7d supply, fill #0

## 2023-07-12 MED ORDER — PHENAZOPYRIDINE HCL 100 MG PO TABS
100.0000 mg | ORAL_TABLET | Freq: Three times a day (TID) | ORAL | 0 refills | Status: DC | PRN
  Filled 2023-07-12: qty 10, 4d supply, fill #0

## 2023-07-12 NOTE — ED Provider Notes (Signed)
 [name]    CSN: [id number] Arrival date & time: 07/12/23  1622      History   Chief Complaint No chief complaint on file.   HPI Jimmy [name] is a 68 y.o. male.   Patient presents to clinic over concerns of urgency, frequency and hematuria.  When patient woke around 1 PM today he noticed that he was using the bathroom quite frequently.  He got a drop of urine on the toilet and noticed that it was light pink in color.  He has been drinking plenty of water since then but has had some pelvic discomfort.  Reports he has not been sexually active in some time, does masturbate.  Denies any testicle pain or swelling.  Denies penile discharge.  Denies flank pain, fevers, nausea or vomiting.  The history is provided by the patient and medical records.    Past Medical History:  Diagnosis Date   Atrial fibrillation (HCC)    had loop recorder, PAcs after DCCV 09/2019   Atrial flutter (HCC)    HLD (hyperlipidemia)    On amiodarone therapy 09/15/2019   Pain of right lower leg 01/02/2022    Patient Active Problem List   Diagnosis Date Noted   Multiple myeloma in relapse (HCC) 06/05/2023   Annual physical exam 05/21/2023   Lipid screening 05/21/2023   Nocturia 05/21/2023   Abnormal SPEP 04/16/2023   Muscle fatigue 04/01/2023   Abnormal glucose 04/01/2023   Anemia 04/01/2023   Thyroid disorder screening 04/01/2023   Current use of estrogen therapy 05/20/2022   Gynecomastia, male 05/20/2022   Vitamin B 12 deficiency 05/20/2022   Class 1 obesity due to excess calories with serious comorbidity and body mass index (BMI) of 34.0 to 34.9 in adult 05/20/2022   Epidermoid cyst 05/20/2022   Colon cancer screening 05/20/2022   Closed fracture of glenoid cavity of right scapula 01/23/2022   History of atrial fibrillation 07/06/2020   S/P Maze operation for atrial fibrillation 09/15/2019   HLD (hyperlipidemia) 06/24/2018   Vitamin D deficiency 06/24/2018    Past Surgical  History:  Procedure Laterality Date   CARDIOVERSION     2021 for Afib in [place]   CHOLECYSTECTOMY     IR BONE MARROW BIOPSY & ASPIRATION  05/18/2023   MINIMALLY INVASIVE MAZE PROCEDURE     done in [place] Dr R. Sheppard Penton   stye Right 09/2022       Home Medications    Prior to Admission medications   Medication Sig Start Date End Date Taking? Authorizing Provider  phenazopyridine (PYRIDIUM) 100 MG tablet Take 1 tablet (100 mg total) by mouth 3 (three) times daily as needed for pain. 07/12/23  Yes Rinaldo Ratel, [place] N, FNP  sulfamethoxazole-trimethoprim (BACTRIM DS) 800-160 MG tablet Take 1 tablet by mouth 2 (two) times daily for 7 days. 07/12/23 07/19/23 Yes Rinaldo Ratel, [place] N, FNP  amiodarone (PACERONE) 200 MG tablet Take 200 mg by mouth daily.    [provider]  CALCIUM-MAGNESUIUM-ZINC [phone] MG TABS 1 capsule once daily 02/02/19   [provider]  Cholecalciferol (VITAMIN D3) 50 MCG (2000 UT) capsule Take 2,000 Units by mouth daily.    [provider]  cyanocobalamin 1000 MCG tablet Take 2,000 mcg by mouth. 06/18/19   [provider]  estradiol (VIVELLE-DOT) 0.1 MG/24HR patch Place 1 patch onto the skin. Q3.5 days 10/09/18   [provider]  Multiple Vitamins-Minerals (MULTIVITAMIN WOMEN PO) Take by mouth daily.    [provider]  Omega-3  1400 MG CAPS  04/04/23   [provider]  Turmeric (CURCUMIN 95) 500 MG CAPS Take 500 mg by mouth 2 (two) times daily.    [provider]    Family History Family History  Problem Relation Age of Onset   COPD Mother    Atrial fibrillation Mother        ? due to Afib died 40   Cancer Father        lung cancer smoker age 34    Heart disease Father        bypass at 55    Atrial fibrillation Sister     Social History Social History   Tobacco Use   Smoking status: Never   Smokeless tobacco: Never  Vaping Use   Vaping status: Never Used  Substance Use Topics   Alcohol use:  Never   Drug use: Not Currently     Allergies   Patient has no known allergies.   Review of Systems Review of Systems  Per HPI  Physical Exam Triage Vital Signs ED Triage Vitals  Encounter Vitals Group     BP 07/12/23 1630 133/79     Systolic BP Percentile --      Diastolic BP Percentile --      Pulse Rate 07/12/23 1630 88     Resp 07/12/23 1630 20     Temp 07/12/23 1630 98.3 F (36.8 C)     Temp Source 07/12/23 1630 Oral     SpO2 07/12/23 1630 95 %     Weight --      Height --      Head Circumference --      Peak Flow --      Pain Score 07/12/23 1628 1     Pain Loc --      Pain Education --      Exclude from Growth Chart --    No data found.  Updated Vital Signs BP 133/79 (BP Location: Right Arm)   Pulse 88   Temp 98.3 F (36.8 C) (Oral)   Resp 20   SpO2 95%   Visual Acuity Right Eye Distance:   Left Eye Distance:   Bilateral Distance:    Right Eye Near:   Left Eye Near:    Bilateral Near:     Physical Exam Vitals and nursing note reviewed.  Constitutional:      Appearance: Normal appearance.  HENT:     Head: Normocephalic and atraumatic.     Right Ear: External ear normal.     Left Ear: External ear normal.     Nose: Nose normal.     Mouth/Throat:     Mouth: Mucous membranes are moist.  Eyes:     Conjunctiva/sclera: Conjunctivae normal.  Cardiovascular:     Rate and Rhythm: Normal rate.  Pulmonary:     Effort: Pulmonary effort is normal. No respiratory distress.  Abdominal:     Tenderness: There is no right CVA tenderness or left CVA tenderness.  Neurological:     General: No focal deficit present.     Mental Status: He is alert.  Psychiatric:        Mood and Affect: Mood normal.      UC Treatments / Results  Labs (all labs ordered are listed, but only abnormal results are displayed) Labs Reviewed  POCT URINALYSIS DIP (MANUAL ENTRY) - Abnormal; Notable for the following components:      Result Value   Blood, UA large (*)  Protein Ur, POC trace (*)    Leukocytes, UA Small (1+) (*)    All other components within normal limits  URINE CULTURE    EKG   Radiology No results found.  Procedures Procedures (including critical care time)  Medications Ordered in UC Medications - No data to display  Initial Impression / Assessment and Plan / UC Course  I have reviewed the triage vital signs and the nursing notes.  Pertinent labs & imaging results that were available during my care of the patient were reviewed by me and considered in my medical decision making (see chart for details).  Vitals in triage reviewed, patient is hemodynamically stable.  Urinary urgency, frequency and hematuria.  Urine dipstick in clinic shows red blood cells and leukocytes, will send for culture and treat for acute cystitis with 7 days of Bactrim.  Lower concern for prostatitis, patient is not currently sexually active.  Symptomatic management for dysuria discussed.  Plan of care, follow-up care return precautions given, no questions at this time.      Final Clinical Impressions(s) / UC Diagnoses   Final diagnoses:  Acute cystitis with hematuria     Discharge Instructions      Your urine showed that you have a urinary tract infection.  Take the antibiotics, Bactrim, twice daily with food for the next 7 days.  Ensure you are drinking at least 64 ounces of water daily to help flush your kidneys.  For the discomfort with urination you can take the Pyridium up to 3 times daily as needed, this will change the color of your urine to a highlighter yellow-orange color.  This will also help numb up the urethra so would not be painful to urinate.  Symptoms should improve with medications over the next few days, if no improvement or any changes return to clinic for reevaluation.    ED Prescriptions     Medication Sig Dispense Auth. Provider   sulfamethoxazole-trimethoprim (BACTRIM DS) 800-160 MG tablet Take 1 tablet by mouth 2  (two) times daily for 7 days. 14 tablet Rinaldo Ratel, [place] N, [place]   phenazopyridine (PYRIDIUM) 100 MG tablet Take 1 tablet (100 mg total) by mouth 3 (three) times daily as needed for pain. 10 tablet Trelon Plush, [place] N, FNP      PDMP not reviewed this encounter.   Daquarius Dubeau, [place] N, [place] 07/12/23 [phone]

## 2023-07-12 NOTE — ED Triage Notes (Signed)
 Pt reports lower pelvic discomfort, pt did notice some discoloration to his urine color.

## 2023-07-12 NOTE — Discharge Instructions (Signed)
 Your urine showed that you have a urinary tract infection.  Take the antibiotics, Bactrim, twice daily with food for the next 7 days.  Ensure you are drinking at least 64 ounces of water daily to help flush your kidneys.  For the discomfort with urination you can take the Pyridium up to 3 times daily as needed, this will change the color of your urine to a highlighter yellow-orange color.  This will also help numb up the urethra so would not be painful to urinate.  Symptoms should improve with medications over the next few days, if no improvement or any changes return to clinic for reevaluation.

## 2023-07-13 ENCOUNTER — Ambulatory Visit

## 2023-07-13 LAB — URINE CULTURE: Culture: NO GROWTH

## 2023-07-16 ENCOUNTER — Encounter: Payer: Self-pay | Admitting: Internal Medicine

## 2023-07-16 ENCOUNTER — Telehealth: Payer: Self-pay | Admitting: Pharmacist

## 2023-07-16 ENCOUNTER — Other Ambulatory Visit: Payer: Self-pay | Admitting: Internal Medicine

## 2023-07-16 ENCOUNTER — Other Ambulatory Visit (HOSPITAL_COMMUNITY): Payer: Self-pay

## 2023-07-16 ENCOUNTER — Inpatient Hospital Stay (HOSPITAL_BASED_OUTPATIENT_CLINIC_OR_DEPARTMENT_OTHER): Admitting: Internal Medicine

## 2023-07-16 ENCOUNTER — Telehealth: Payer: Self-pay | Admitting: Pharmacy Technician

## 2023-07-16 VITALS — BP 118/81 | HR 97 | Temp 97.8°F | Resp 18 | Wt 228.0 lb

## 2023-07-16 DIAGNOSIS — C9002 Multiple myeloma in relapse: Secondary | ICD-10-CM

## 2023-07-16 DIAGNOSIS — C9000 Multiple myeloma not having achieved remission: Secondary | ICD-10-CM

## 2023-07-16 DIAGNOSIS — Z5112 Encounter for antineoplastic immunotherapy: Secondary | ICD-10-CM | POA: Diagnosis not present

## 2023-07-16 MED ORDER — ONDANSETRON HCL 8 MG PO TABS: ORAL_TABLET | ORAL | 1 refills | Status: AC

## 2023-07-16 MED ORDER — LENALIDOMIDE 25 MG PO CAPS
25.0000 mg | ORAL_CAPSULE | Freq: Every day | ORAL | 0 refills | Status: DC
Start: 2023-07-16 — End: 2023-08-07

## 2023-07-16 MED ORDER — ACYCLOVIR 400 MG PO TABS: 400.0000 mg | ORAL_TABLET | Freq: Two times a day (BID) | ORAL | 2 refills | Status: DC

## 2023-07-16 MED ORDER — PROCHLORPERAZINE MALEATE 10 MG PO TABS
10.0000 mg | ORAL_TABLET | Freq: Four times a day (QID) | ORAL | 1 refills | Status: AC | PRN
Start: 2023-07-16 — End: ?

## 2023-07-16 MED ORDER — LENALIDOMIDE 25 MG PO CAPS: 25.0000 mg | ORAL_CAPSULE | Freq: Every day | ORAL | 0 refills | Status: DC

## 2023-07-16 NOTE — Progress Notes (Signed)
 START ON PATHWAY REGIMEN - Multiple Myeloma and Other Plasma Cell Dyscrasias   DaraVRd (Daratumumab SUBQ + Bortezomib SUBQ Z6,1,09,60 + Lenalidomide PO D1-21 + Dexamethasone IV/PO A5,4,09,81) q28 Days (Induction Schema):   Cycles 1 and 2: A cycle is every 28 days:     Lenalidomide      Dexamethasone      Bortezomib      Daratumumab and hyaluronidase-fihj    Cycles 3 and 4: A cycle is every 28 days:     Lenalidomide      Dexamethasone      Bortezomib      Daratumumab and hyaluronidase-fihj    DaraVRd (Daratumumab SUBQ + Bortezomib SUBQ X9,1,47,82 + Lenalidomide PO D1-21 + Dexamethasone IV/PO N5,6,21,30) q28 Days (Consolidation Schema):   A cycle is every 28 days:     Lenalidomide      Dexamethasone      Bortezomib      Daratumumab and hyaluronidase-fihj   **Always confirm dose/schedule in your pharmacy ordering system**  Patient Characteristics: Multiple Myeloma, Newly Diagnosed, Transplant Eligible, Standard Risk Disease Classification: Multiple Myeloma Therapeutic Status: Newly Diagnosed R2-ISS Staging: II Is Patient Eligible for Transplant<= Transplant Eligible Risk Status: Standard Risk Intent of Therapy: Non-Curative / Palliative Intent, Discussed with Patient

## 2023-07-16 NOTE — Telephone Encounter (Signed)
 Oral Oncology Patient Advocate Encounter  Prior Authorization for Lenalidomide has been approved.    PA# [id number] Effective dates: 06/16/2023 through 04/02/2098  Patient will fill at [facility].  Supporting documents e-faxed to [facility].    Patty Benjaman Branch, CPhT Oncology Pharmacy Patient Advocate [facility] Direct Number: [phone] Fax: [phone]

## 2023-07-16 NOTE — Telephone Encounter (Signed)
 Oral Oncology Patient Advocate Encounter   Received notification that prior authorization for Lenalidomide is required.   PA submitted on 07/16/2023 Key [id number] Status is pending     Patty Benjaman Branch, CPhT Oncology Pharmacy Patient Advocate [facility] Direct Number: [phone] Fax: [phone]

## 2023-07-16 NOTE — Progress Notes (Signed)
 Patient had shingles vaccine on 07/04/2023, and then last Thursday he feels like he has shingles on his neck.

## 2023-07-16 NOTE — Progress Notes (Signed)
 DISCONTINUE ON PATHWAY REGIMEN - Multiple Myeloma and Other Plasma Cell Dyscrasias   DaraVRd (Daratumumab SUBQ + Bortezomib SUBQ Z6,1,09,60 + Lenalidomide PO D1-21 + Dexamethasone IV/PO A5,4,09,81) q28 Days (Induction Schema):   Cycles 1 and 2: A cycle is every 28 days:     Lenalidomide      Dexamethasone      Bortezomib      Daratumumab and hyaluronidase-fihj    Cycles 3 and 4: A cycle is every 28 days:     Lenalidomide      Dexamethasone      Bortezomib      Daratumumab and hyaluronidase-fihj    DaraVRd (Daratumumab SUBQ + Bortezomib SUBQ X9,1,47,82 + Lenalidomide PO D1-21 + Dexamethasone IV/PO N5,6,21,30) q28 Days (Consolidation Schema):   A cycle is every 28 days:     Lenalidomide      Dexamethasone      Bortezomib      Daratumumab and hyaluronidase-fihj   **Always confirm dose/schedule in your pharmacy ordering system**  PRIOR TREATMENT: [id number]: DaraVRd - Subcutaneous Daratumumab (Daratumumab/hyaluronidase SUBQ + Bortezomib 1.3 mg/m2 SUBQ D1, 8, 15, 22 + Lenalidomide 25 mg PO D1-21 + Dexamethasone 40 mg PO/IV D1, 8, 15, 22) q28 Days Concurrent with Referral to  Transplant Service  START ON PATHWAY REGIMEN - Multiple Myeloma and Other Plasma Cell Dyscrasias   DaraVRd (Daratumumab SUBQ + Bortezomib SUBQ N6,2,95,28 + Lenalidomide PO D1-21 + Dexamethasone IV/PO U1,3,24,40) q28 Days (Induction Schema):   Cycles 1 and 2: A cycle is every 28 days:     Lenalidomide      Dexamethasone      Bortezomib      Daratumumab and hyaluronidase-fihj    Cycles 3 and 4: A cycle is every 28 days:     Lenalidomide      Dexamethasone      Bortezomib      Daratumumab and hyaluronidase-fihj    DaraVRd (Daratumumab SUBQ + Bortezomib SUBQ N0,2,72,53 + Lenalidomide PO D1-21 + Dexamethasone IV/PO G6,4,40,34) q28 Days (Consolidation Schema):   A cycle is every 28 days:     Lenalidomide      Dexamethasone      Bortezomib      Daratumumab and hyaluronidase-fihj   **Always confirm  dose/schedule in your pharmacy ordering system**  Patient Characteristics: Multiple Myeloma, Newly Diagnosed, Transplant Eligible, Standard Risk Disease Classification: Multiple Myeloma Therapeutic Status: Newly Diagnosed R2-ISS Staging: II Is Patient Eligible for Transplant<= Transplant Eligible Risk Status: Standard Risk Intent of Therapy: Non-Curative / Palliative Intent, Discussed with Patient

## 2023-07-16 NOTE — Progress Notes (Signed)
 [facility] CONSULT NOTE  Patient Care Team: Dana Allan, MD as PCP - General (Family Medicine) Earna Coder, MD as Consulting Physician (Oncology)  CHIEF COMPLAINTS/PURPOSE OF CONSULTATION: Multiple Myeloma   Oncology History Overview Note  # LIGHT CHAIN MONOCLONAL GAMMOPATHY -DEC 2024-[PCP-SPEP-NEGATIVE; Hb 13; GFR-> 60 ca-WN; However, random UPEP- positive for M protein/Bence-Jones- Protein positive; kappa type.  JAN 2025- SPEP- NEG; K/l= 91 [K=400]. Dx= includes smoldering MM vs active MM. 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  JAN 2025- SPEP- NEG; K/l= 91 [K=400].Dr.Choi- [facility]- BMT  # 07/26/2023- Dara-RVD-   EXOGENOUS ESTROGENS: Estradiol for 7-9 years   Multiple myeloma in relapse (HCC)  06/05/2023 Initial Diagnosis   Multiple myeloma in relapse (HCC)   06/05/2023 Cancer Staging   Staging form: Plasma Cell Myeloma and Plasma Cell Disorders, AJCC 8th Edition - Clinical: Albumin (g/dL): 4, ISS: Stage II, High-risk cytogenetics: Absent, LDH: Normal - Signed by Earna Coder, MD on 06/05/2023 Albumin range (g/dL): Greater than or equal to 3.5 Cytogenetics: t(11;14) translocation   06/06/2023 Cancer Staging   Staging form: Plasma Cell Myeloma and Plasma Cell Disorders, AJCC 8th Edition - Clinical: RISS Stage I (Beta-2-microglobulin (mg/L): 2.5, Albumin (g/dL): 4, ISS: Stage I, High-risk cytogenetics: Absent, LDH: Normal) - Signed by Earna Coder, MD on 06/06/2023 Stage prefix: Initial diagnosis Beta 2 microglobulin range (mg/L): Less than 3.5 Albumin range (g/dL):  Greater than or equal to 3.5 Cytogenetics: t(11;14) translocation   07/27/2023 -  Chemotherapy   Patient is on Treatment Plan : MYELOMA NEWLY DIAGNOSED Daratumumab IV + Lenalidomide + Dexamethasone Weekly (DaraRd) q28d       HEMATOLOGY HISTORY:   HISTORY OF PRESENTING ILLNESS: Patient ambulating-independently. Accompanied by wife.   Jimmy [name] 68 y.o.  male with Multiple Myeloma [dx: march 2025] with multiple bone lesions is here for a follow-up.   In the interim patient underwent evaluation with [facility] for a second opinion.  Patient continues to complain of on going fatigue.  Otherwise denies any tingling and numbness in the extremities.  No falls.   Review of Systems  Constitutional:  Positive for malaise/fatigue. Negative for chills, diaphoresis, fever and weight loss.  HENT:  Negative for nosebleeds and sore throat.   Eyes:  Negative for double vision.  Respiratory:  Negative for cough, hemoptysis, sputum production, shortness of breath and wheezing.   Cardiovascular:  Negative for chest pain, palpitations, orthopnea and leg swelling.  Gastrointestinal:  Negative for abdominal pain, blood in stool, constipation, diarrhea, heartburn, melena, nausea and vomiting.  Genitourinary:  Negative for dysuria, frequency and urgency.  Musculoskeletal:  Negative for back pain and joint pain.  Skin: Negative.  Negative for itching and rash.  Neurological:  Negative for dizziness, tingling, focal weakness, weakness and headaches.  Endo/Heme/Allergies:  Does not bruise/bleed easily.  Psychiatric/Behavioral:  Negative for depression. The patient is not nervous/anxious and does not have insomnia.     MEDICAL HISTORY:  Past Medical History:  Diagnosis Date   Atrial fibrillation (HCC)    had loop recorder, PAcs after DCCV 09/2019   Atrial flutter (HCC)    HLD (hyperlipidemia)  On amiodarone therapy 09/15/2019   Pain of right lower leg 01/02/2022    SURGICAL HISTORY: Past Surgical  History:  Procedure Laterality Date   CARDIOVERSION     2021 for Afib in [place]   CHOLECYSTECTOMY     IR BONE MARROW BIOPSY & ASPIRATION  05/18/2023   MINIMALLY INVASIVE MAZE PROCEDURE     done in [place]  Dr R. Sullivan Endow   stye Right 09/2022    SOCIAL HISTORY: Social History   Socioeconomic History   Marital status: Married    Spouse name: Not on file   Number of children: Not on file   Years of education: Not on file   Highest education level: Bachelor's degree (e.g., BA, AB, BS)  Occupational History   Not on file  Tobacco Use   Smoking status: Never   Smokeless tobacco: Never  Vaping Use   Vaping status: Never Used  Substance and Sexual Activity   Alcohol use: Never   Drug use: Not Currently   Sexual activity: Not Currently  Other Topics Concern   Not on file  Social History Narrative   2 sons and 1 daughter    Married    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    No guns, wears seat belt, safe in relationship   Vegan x 8-9 years as of 07/06/20    Social Drivers of Corporate investment banker Strain: Low Risk  (04/16/2023)   Overall Financial Resource Strain (CARDIA)    Difficulty of Paying Living Expenses: Not hard at all  Food Insecurity: No Food Insecurity (04/16/2023)   Hunger Vital Sign    Worried About Running Out of Food in the Last Year: Never true    Ran Out of Food in the Last Year: Never true  Transportation Needs: No Transportation Needs (04/16/2023)   PRAPARE - Administrator, Civil Service (Medical): No    Lack of Transportation (Non-Medical): No  Physical Activity: Unknown (04/16/2023)   Exercise Vital Sign    Days of Exercise per Week: 0 days    Minutes of Exercise per Session: Not on file  Stress: No Stress Concern Present (04/16/2023)   [name]    Feeling of Stress : Not at all  Social Connections: Moderately Integrated (04/16/2023)   Social Connection and Isolation  Panel [NHANES]    Frequency of Communication with Friends and Family: Twice a week    Frequency of Social Gatherings with Friends and Family: Twice a week    Attends Religious Services: 1 to 4 times per year    Active Member of [employer] or Organizations: No    Attends Banker Meetings: Never    Marital Status: Married  Catering manager Violence: Not At Risk (04/16/2023)   Humiliation, Afraid, Rape, and Kick questionnaire    Fear of Current or Ex-Partner: No    Emotionally Abused: No    Physically Abused: No    Sexually Abused: No    FAMILY HISTORY: Family History  Problem Relation Age of Onset   COPD Mother    Atrial fibrillation Mother        ? due to Afib died 10   Cancer Father        lung cancer smoker age 61    Heart disease Father        bypass at 53    Atrial fibrillation Sister     ALLERGIES:  has no known allergies.  MEDICATIONS:  Current Outpatient Medications  Medication Sig Dispense Refill   acyclovir (ZOVIRAX) 400 MG tablet Take 1 tablet (400 mg total) by mouth 2 (two) times daily. 120 tablet 2   cyanocobalamin 1000 MCG tablet Take 2,000 mcg by mouth.     Multiple Vitamins-Minerals (MULTIVITAMIN WOMEN PO) Take by mouth daily.     Omega-3 1400 MG CAPS      ondansetron (ZOFRAN) 8 MG tablet One pill every 8 hours as needed for nausea/vomitting. 40 tablet 1   prochlorperazine (COMPAZINE) 10 MG tablet Take 1 tablet (10 mg total) by mouth every 6 (six) hours as needed for nausea or vomiting. 40 tablet 1   sulfamethoxazole-trimethoprim (BACTRIM DS) 800-160 MG tablet Take 1 tablet by mouth 2 (two) times daily for 7 days. 14 tablet 0   Turmeric (CURCUMIN 95) 500 MG CAPS Take 500 mg by mouth 2 (two) times daily.     No current facility-administered medications for this visit.   PHYSICAL EXAMINATION:   Vitals:   07/16/23 0950  BP: 118/81  Pulse: 97  Resp: 18  Temp: 97.8 F (36.6 C)  SpO2: 99%    Filed Weights   07/16/23 0950  Weight: 228 lb (103.4  kg)    Positive for bilateral gynecomastia.   Physical Exam Vitals and nursing note reviewed.  HENT:     Head: Normocephalic and atraumatic.     Mouth/Throat:     Pharynx: Oropharynx is clear.  Eyes:     Extraocular Movements: Extraocular movements intact.     Pupils: Pupils are equal, round, and reactive to light.  Cardiovascular:     Rate and Rhythm: Normal rate and regular rhythm.  Abdominal:     Palpations: Abdomen is soft.  Musculoskeletal:        General: Normal range of motion.     Cervical back: Normal range of motion.  Skin:    General: Skin is warm.  Neurological:     General: No focal deficit present.     Mental Status: He is alert and oriented to person, place, and time.  Psychiatric:        Behavior: Behavior normal.        Judgment: Judgment normal.     LABORATORY DATA:  I have reviewed the data as listed Lab Results  Component Value Date   WBC 8.0 07/03/2023   HGB 13.4 07/03/2023   HCT 39.7 07/03/2023   MCV 92.1 07/03/2023   PLT 171 07/03/2023   Recent Labs    03/26/23 1535 04/16/23 1208 07/03/23 0914  NA 139 135 138  K 4.3 4.0 3.7  CL 103 99 103  CO2 27 26 27   GLUCOSE 93 95 119*  BUN 7 7* 10  CREATININE 0.78 0.74 0.81  CALCIUM 9.1 9.4 9.3  GFRNONAA  --  >60 >60  PROT 6.0*  6.0 6.4* 6.3*  ALBUMIN 4.2 4.0 3.8  AST 19 25 20   ALT 20 29 24   ALKPHOS 83 73 78  BILITOT 0.5 0.8 0.7   No results found.   Lab Results  Component Value Date   KPAFRELGTCHN 490.9 (H) 07/03/2023   KPAFRELGTCHN 439.1 (H) 04/16/2023   LAMBDASER 4.4 (L) 07/03/2023   LAMBDASER 4.8 (L) 04/16/2023   KAPLAMBRATIO 111.57 (H) 07/03/2023   KAPLAMBRATIO 91.48 (H) 04/16/2023     Multiple myeloma in relapse (HCC) # LIGHT CHAIN MONOCLONAL GAMMOPATHY -DEC 2024-[PCP-SPEP-NEGATIVE; Hb 13; GFR-> 60 ca-WN; However, random UPEP- positive for M protein/Bence-Jones- Protein positive;  kappa type.  JAN 2025- SPEP- NEG; K/l= 91 [K=400]. Dx= includes smoldering MM vs active MM.  FEB 2025- Bone marrow- BONE MARROW, ASPIRATE, CLOT, CORE:  -  Kappa restricted plasma cell neoplasm involving 50 to 90% of the  cellular marrow (patchy areas; average 70%)   cytogenetics: 11:14; 13 q- [standard risk] Negative Congo stain. MARCH 2025-PET Numerous lytic myelomatous bone lesions throughout the axial and appendicular skeleton noted on the CT scan without associated discrete significant hypermetabolism.  There are 2 slightly hypermetabolic foci in noted in the right femur which appear to correlate with CT abnormalities. Low level hypermetabolism with SUV max of 2.26 and 2.20. A few other scattered bone lesions have similar FDG uptake. No hypermetabolic soft tissue masses or adenopathy. S/P evaluation with Dr.Choi [facility]- bone marrow evaluation]  # Recommend proceeding with Dara-RVD [Griffin trial/ Perseus clinical trial- MRD negativity (10?5) rate of 75%]-   # I had a long discussion of the treatment options including-Dara- Rev-dex. I had a lengthy discussion regarding the mechanism of action of each drug.    # Discussed the mechanism of action of each drug-Revlimid immunomodulatory; Daratumumab targeted therapy; dexamethasone-cytotoxic.  Discussed the potential side effects- Dara SQ-infusion reaction; risk of infections etc.;  Low blood counts etc. Revlimid-teratogenic side effects; skin rash diarrhea; thromboembolic events [aspirin prophylaxis]; dexamethasone-elevated blood sugars/weight gain fluid retention etc.  Understands treatments are palliative; not curative.  Also discussed that Dara SQ weekly for the first 2 months; and then every 2 weeks for the next 4 months; then every month.  Patient will need to be monitored postinfusion for the first 2 cycles.  Patient will also need premedication to avoid acute infusion reactions. Revlimid-survey discussed with patient.  Discussed with pharmacy.   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   # Multiple bone  lesion-planned Zometa recommend dental clearance.   # History of a flutter [s/p ablation in [place] ]-no anticoagulation; regular rhythm- stable.   # Vaccination: discussed re: increased risk of infection- s/p  shingles vaccination- on acyclovir prophylaxis  # # IV access:PIV  #  Sent the scripts for antiemetics-Zofran and Compazine;acylovir to mail in pharmacy  # Given the urgency for the need to proceed with treatment of multiple myeloma I think it is recommended patient cancel his cruise trip. Letter given.   # DISPOSITION: # letter- canceling cruise # refer to Chemo education ASAP- re: chemo- Dara- RVD # Follow up TBD- Dr.B      All questions were answered. The patient knows to call the clinic with any problems, questions or concerns.    Gwyn Leos, MD 07/16/2023 12:56 PM

## 2023-07-16 NOTE — Telephone Encounter (Signed)
 Clinical Pharmacist Practitioner Encounter   Received new prescription for Revlimid (lenalidomide) for the treatment of multiple myeloma in conjunction with daratumumab, bortezomib, and dexamethasone, planned duration until disease progression or unacceptable drug toxicity. Planned start 07/26/23.   CMP from 07/03/23 assessed, no relevant lab abnormalities. Prescription dose and frequency assessed.   Current medication list in Epic reviewed, no DDIs with lenalidomide identified.   Evaluated chart and no patient barriers to medication adherence identified.   Oral Oncology Clinic will continue to follow for insurance authorization, copayment issues, initial counseling and start date.   Lynnette Pote N. Va Broadwell, PharmD, BCOP, CPP Hematology/Oncology Clinical Pharmacist [facility] Oral Chemotherapy Navigation Clinic [phone]  07/16/2023 12:49 PM

## 2023-07-16 NOTE — Assessment & Plan Note (Addendum)
#   LIGHT CHAIN MONOCLONAL GAMMOPATHY -DEC 2024-[PCP-SPEP-NEGATIVE; Hb 13; GFR-> 60 ca-WN; However, random UPEP- positive for M protein/Bence-Jones- Protein positive; kappa type.  JAN 2025- SPEP- NEG; K/l= 91 [K=400]. Dx= includes smoldering MM vs active MM. FEB 2025- Bone marrow- BONE MARROW, ASPIRATE, CLOT, CORE:  -  Kappa restricted plasma cell neoplasm involving 50 to 90% of the  cellular marrow (patchy areas; average 70%)   cytogenetics: 11:14; 13 q- [standard risk] Negative Congo stain. MARCH 2025-PET Numerous lytic myelomatous bone lesions throughout the axial and appendicular skeleton noted on the CT scan without associated discrete significant hypermetabolism.  There are 2 slightly hypermetabolic foci in noted in the right femur which appear to correlate with CT abnormalities. Low level hypermetabolism with SUV max of 2.26 and 2.20. A few other scattered bone lesions have similar FDG uptake. No hypermetabolic soft tissue masses or adenopathy. S/P evaluation with Dr.Choi [facility]- bone marrow evaluation]. Discussed with Dr.Choi.   # Recommend proceeding with Dara-RVD [Griffin trial/ Perseus clinical trial- MRD negativity (10?5) rate of 75%]-   # I had a long discussion of the treatment options including-Dara- Rev-dex. I had a lengthy discussion regarding the mechanism of action of each drug.    # Discussed the mechanism of action of each drug-Revlimid immunomodulatory; Daratumumab targeted therapy; dexamethasone-cytotoxic.  Discussed the potential side effects- Dara SQ-infusion reaction; risk of infections etc.;  Low blood counts etc. Revlimid-teratogenic side effects; skin rash diarrhea; thromboembolic events [aspirin prophylaxis]; dexamethasone-elevated blood sugars/weight gain fluid retention etc.  Understands treatments are palliative; not curative.  Also discussed that Dara SQ weekly for the first 2 months; and then every 2 weeks for the next 4 months; then every month.  Patient will need to be  monitored postinfusion for the first 2 cycles.  Patient will also need premedication to avoid acute infusion reactions. Revlimid-survey discussed with patient.  Discussed with pharmacy.   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   # Multiple bone lesion-planned Zometa recommend dental clearance.   # History of a flutter [s/p ablation in [place] ]-no anticoagulation; regular rhythm- stable.   # Vaccination: discussed re: increased risk of infection- s/p  shingles vaccination- on acyclovir prophylaxis  # # IV access:PIV  #  Sent the scripts for antiemetics-Zofran and Compazine;acylovir to mail in pharmacy  # Given the urgency for the need to proceed with treatment of multiple myeloma I think it is recommended patient cancel his cruise trip. Letter given.   # DISPOSITION: # letter- canceling cruise # refer to Chemo education ASAP- re: chemo- Dara- RVD # Follow up TBD- Dr.B

## 2023-07-17 ENCOUNTER — Other Ambulatory Visit: Payer: Self-pay

## 2023-07-18 ENCOUNTER — Encounter: Payer: Self-pay | Admitting: Pharmacist

## 2023-07-18 ENCOUNTER — Telehealth: Payer: Self-pay | Admitting: Pharmacy Technician

## 2023-07-18 ENCOUNTER — Encounter: Payer: Self-pay | Admitting: Internal Medicine

## 2023-07-18 MED ORDER — DEXAMETHASONE 4 MG PO TABS: ORAL_TABLET | ORAL | 5 refills | Status: DC

## 2023-07-18 NOTE — Telephone Encounter (Signed)
 Spoke with patient over the phone about his dexamethasone. His take home dexamethasone has been released from his treatment plan to his local pharmacy. He knows that he will also be receiving some dexamethasone in infusion the day of his treatment.

## 2023-07-18 NOTE — Telephone Encounter (Signed)
 Oral Oncology Patient Advocate Encounter  Contacted [facility] pharmacy to ask if they were having issues processing pt's prescription. Pharmacy representative stated that they were not having issues but were waiting for medication to come in.  I called patient to relay this information and to explain that his insurance approved Lenalidomide which is the generic for Revlimid.  Patient knows to contact me if he has any further questions or concerns.  Patty Benjaman Branch, CPhT Oncology Pharmacy Patient Advocate [facility] Direct Number: [phone] Fax: [phone]

## 2023-07-19 ENCOUNTER — Telehealth: Payer: Self-pay

## 2023-07-19 ENCOUNTER — Encounter: Payer: Self-pay | Admitting: Internal Medicine

## 2023-07-19 ENCOUNTER — Encounter: Payer: Self-pay | Admitting: Pharmacist

## 2023-07-19 NOTE — Telephone Encounter (Signed)
 Clinical Pharmacist Practitioner Encounter   Patient is still awaiting delivery of medication from [facility].    Patient Education I spoke with patient for overview of new oral chemotherapy medication: Revlimid (lenalidomide) for the treatment of multiple myeloma in conjunction with daratumumab, bortezomib, and dexamethasone, planned duration until disease progression or unacceptable drug toxicity. Planned start 07/26/23.   Counseled patient on administration, dosing, side effects, monitoring, drug-food interactions, safe handling, storage, and disposal. Patient will take 1 capsule (25 mg total) by mouth daily. Take for 14 days, then hold for 7 days. Repeat every 21 days.   Side effects include but not limited to: rash/itchy skin, N/V, fatigue, decreased wbc/hgb/plt, constipation or diarrhea.   Diarrhea: patient knows to use loperamide as needed and call the office if they are having four or more loose stool per day Rash: Patient know to call the office if rash occurs  Reviewed with patient importance of keeping a medication schedule and plan for any missed doses.  After discussion with patient no patient barriers to medication adherence identified.   Mr. Conwell voiced understanding and appreciation. All questions answered. Medication handout provided.  Provided patient with Oral Chemotherapy Navigation Clinic phone number. Patient knows to call the office with questions or concerns. Oral Chemotherapy Navigation Clinic will continue to follow.  Calvert Charland N. Maitland Muhlbauer, PharmD, BCOP, CPP Hematology/Oncology Clinical Pharmacist [facility] Oral Chemotherapy Navigation Clinic [phone]  07/19/2023 11:06 AM

## 2023-07-19 NOTE — Telephone Encounter (Signed)
 Spoke the [facility], they had notes that the provider was changing the pt from 28 days cycle to 21 day cycle and they had the old 28 days cycle rx discontinued already. The representative I spoke with was unclear why they called for clarification. They will continue to process the 21 day cycle prescription (14on/7off).

## 2023-07-19 NOTE — Telephone Encounter (Signed)
 Call received for clarification on Revlimid directions.  They received 2 prescriptions yesterday one for 21 days and one for 14 days.  Which one is correct?  Reference # [id number] CC

## 2023-07-19 NOTE — Telephone Encounter (Signed)
 Clarified with patient that 4/24 app would be in person. Patient elected to complete education over the phone during my phone call with him this morning.

## 2023-07-20 ENCOUNTER — Inpatient Hospital Stay

## 2023-07-20 NOTE — Progress Notes (Signed)
 [facility] CSW Progress Note  Clinical Social Work introduced self to patient during Patient Education with Raoul Moats, CHARITY [employer]DRA[facility]ER.  Provided information regarding CSW role, including counseling, advanced care planning and support group.  Patient had no questions.   Macario CHRISTELLA Au, LCSW Clinical Social Worker [facility]

## 2023-07-23 NOTE — Telephone Encounter (Signed)
 Per [facility] online portal, medication was delivered to patient on 07/21/23 by [employer].

## 2023-07-24 ENCOUNTER — Encounter: Payer: Self-pay | Admitting: Internal Medicine

## 2023-07-25 ENCOUNTER — Ambulatory Visit: Payer: Self-pay | Admitting: Sports Medicine

## 2023-07-25 NOTE — Progress Notes (Signed)
 Pharmacist Chemotherapy Monitoring - Initial Assessment    Anticipated start date: 07/27/23   The following has been reviewed per standard work regarding the patient's treatment regimen: The patient's diagnosis, treatment plan and drug doses, and organ/hematologic function Lab orders and baseline tests specific to treatment regimen  The treatment plan start date, drug sequencing, and pre-medications Prior authorization status  Patient's documented medication list, including drug-drug interaction screen and prescriptions for anti-emetics and supportive care specific to the treatment regimen The drug concentrations, fluid compatibility, administration routes, and timing of the medications to be used The patient's access for treatment and lifetime cumulative dose history, if applicable  The patient's medication allergies and previous infusion related reactions, if applicable   Changes made to treatment plan:  N/A  Follow up needed:  N/A   Glendora Landsman, PharmD, BCPS Clinical Pharmacist   07/25/2023  3:03 PM

## 2023-07-26 ENCOUNTER — Other Ambulatory Visit

## 2023-07-26 ENCOUNTER — Ambulatory Visit: Admitting: Internal Medicine

## 2023-07-26 ENCOUNTER — Ambulatory Visit

## 2023-07-26 ENCOUNTER — Ambulatory Visit: Admitting: Pharmacist

## 2023-07-27 ENCOUNTER — Encounter: Payer: Self-pay | Admitting: Internal Medicine

## 2023-07-27 ENCOUNTER — Inpatient Hospital Stay: Admitting: Internal Medicine

## 2023-07-27 ENCOUNTER — Inpatient Hospital Stay

## 2023-07-27 ENCOUNTER — Ambulatory Visit

## 2023-07-27 ENCOUNTER — Telehealth: Payer: Self-pay | Admitting: Pharmacist

## 2023-07-27 VITALS — BP 111/82 | HR 91 | Temp 98.1°F

## 2023-07-27 VITALS — BP 109/82 | HR 82 | Temp 98.2°F | Resp 16 | Wt 229.0 lb

## 2023-07-27 DIAGNOSIS — C9000 Multiple myeloma not having achieved remission: Secondary | ICD-10-CM

## 2023-07-27 DIAGNOSIS — Z5112 Encounter for antineoplastic immunotherapy: Secondary | ICD-10-CM | POA: Diagnosis not present

## 2023-07-27 DIAGNOSIS — C9002 Multiple myeloma in relapse: Secondary | ICD-10-CM

## 2023-07-27 LAB — CBC WITH DIFFERENTIAL (CANCER CENTER ONLY)
Abs Immature Granulocytes: 0.03 10*3/uL (ref 0.00–0.07)
Basophils Absolute: 0 10*3/uL (ref 0.0–0.1)
Basophils Relative: 1 %
Eosinophils Absolute: 0.3 10*3/uL (ref 0.0–0.5)
Eosinophils Relative: 5 %
HCT: 38 % — ABNORMAL LOW (ref 39.0–52.0)
Hemoglobin: 12.7 g/dL — ABNORMAL LOW (ref 13.0–17.0)
Immature Granulocytes: 1 %
Lymphocytes Relative: 28 %
Lymphs Abs: 1.9 10*3/uL (ref 0.7–4.0)
MCH: 31 pg (ref 26.0–34.0)
MCHC: 33.4 g/dL (ref 30.0–36.0)
MCV: 92.7 fL (ref 80.0–100.0)
Monocytes Absolute: 0.7 10*3/uL (ref 0.1–1.0)
Monocytes Relative: 11 %
Neutro Abs: 3.7 10*3/uL (ref 1.7–7.7)
Neutrophils Relative %: 54 %
Platelet Count: 182 10*3/uL (ref 150–400)
RBC: 4.1 MIL/uL — ABNORMAL LOW (ref 4.22–5.81)
RDW: 14.9 % (ref 11.5–15.5)
WBC Count: 6.7 10*3/uL (ref 4.0–10.5)
nRBC: 0 % (ref 0.0–0.2)

## 2023-07-27 LAB — TYPE AND SCREEN
ABO/RH(D): A POS
Antibody Screen: NEGATIVE

## 2023-07-27 LAB — CMP (CANCER CENTER ONLY)
ALT: 21 U/L (ref 0–44)
AST: 19 U/L (ref 15–41)
Albumin: 3.7 g/dL (ref 3.5–5.0)
Alkaline Phosphatase: 76 U/L (ref 38–126)
Anion gap: 8 (ref 5–15)
BUN: 10 mg/dL (ref 8–23)
CO2: 26 mmol/L (ref 22–32)
Calcium: 9.2 mg/dL (ref 8.9–10.3)
Chloride: 101 mmol/L (ref 98–111)
Creatinine: 0.73 mg/dL (ref 0.61–1.24)
GFR, Estimated: >60 mL/min (ref >60)
Glucose, Bld: 96 mg/dL (ref 70–99)
Potassium: 4 mmol/L (ref 3.5–5.1)
Sodium: 135 mmol/L (ref 135–145)
Total Bilirubin: 0.6 mg/dL (ref 0.0–1.2)
Total Protein: 6.3 g/dL — ABNORMAL LOW (ref 6.5–8.1)

## 2023-07-27 MED ORDER — DEXAMETHASONE 4 MG PO TABS: ORAL_TABLET | ORAL | 0 refills | Status: DC

## 2023-07-27 MED ORDER — BORTEZOMIB CHEMO SQ INJECTION 3.5 MG (2.5MG/ML)
1.3000 mg/m2 | Freq: Once | INTRAMUSCULAR | Status: AC
  Administered 2023-07-27: 3 mg via SUBCUTANEOUS
  Filled 2023-07-27: qty 1.2

## 2023-07-27 MED ORDER — DEXAMETHASONE 4 MG PO TABS
20.0000 mg | ORAL_TABLET | Freq: Once | ORAL | Status: AC
  Administered 2023-07-27: 20 mg via ORAL
  Filled 2023-07-27: qty 5

## 2023-07-27 MED ORDER — MONTELUKAST SODIUM 10 MG PO TABS
10.0000 mg | ORAL_TABLET | Freq: Once | ORAL | Status: AC
  Administered 2023-07-27: 10 mg via ORAL
  Filled 2023-07-27: qty 1

## 2023-07-27 MED ORDER — DIPHENHYDRAMINE HCL 25 MG PO CAPS
50.0000 mg | ORAL_CAPSULE | Freq: Once | ORAL | Status: AC
  Administered 2023-07-27: 50 mg via ORAL
  Filled 2023-07-27: qty 2

## 2023-07-27 MED ORDER — MONTELUKAST SODIUM 10 MG PO TABS
10.0000 mg | ORAL_TABLET | ORAL | 0 refills | Status: DC
Start: 2023-07-27 — End: 2023-08-10

## 2023-07-27 MED ORDER — ACETAMINOPHEN 325 MG PO TABS
650.0000 mg | ORAL_TABLET | Freq: Once | ORAL | Status: AC
  Administered 2023-07-27: 650 mg via ORAL
  Filled 2023-07-27: qty 2

## 2023-07-27 MED ORDER — DARATUMUMAB-HYALURONIDASE-FIHJ 1800-30000 MG-UT/15ML ~~LOC~~ SOLN
1800.0000 mg | Freq: Once | SUBCUTANEOUS | Status: AC
  Administered 2023-07-27: 1800 mg via SUBCUTANEOUS
  Filled 2023-07-27: qty 15

## 2023-07-27 MED ORDER — ACYCLOVIR 400 MG PO TABS: 400.0000 mg | ORAL_TABLET | Freq: Two times a day (BID) | ORAL | 1 refills | Status: AC

## 2023-07-27 NOTE — Progress Notes (Signed)
 [facility] CONSULT NOTE  Patient Care Team: Valli Gaw, MD as PCP - General (Family Medicine) Gwyn Leos, MD as Consulting Physician (Oncology)  CHIEF COMPLAINTS/PURPOSE OF CONSULTATION: Multiple Myeloma   Oncology History Overview Note  # LIGHT CHAIN MONOCLONAL GAMMOPATHY -DEC 2024-[PCP-SPEP-NEGATIVE; Hb 13; GFR-> 60 ca-WN; However, random UPEP- positive for M protein/Bence-Jones- Protein positive; kappa type.  JAN 2025- SPEP- NEG; K/l= 91 [K=400]. Dx= includes smoldering MM vs active MM. 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)   06/05/2023 Cancer Staging   Staging form: Plasma Cell Myeloma and Plasma Cell Disorders, AJCC 8th Edition - Clinical: Albumin (g/dL): 4, ISS: Stage II, High-risk cytogenetics: Absent, LDH: Normal - Signed by Gwyn Leos, MD on 06/05/2023 Albumin range (g/dL): Greater than or equal to 3.5 Cytogenetics: t(11;14) translocation   06/06/2023 Cancer Staging   Staging form: Plasma Cell Myeloma and Plasma Cell Disorders, AJCC 8th Edition - Clinical: RISS Stage I (Beta-2 -microglobulin (mg/L): 2.5, Albumin (g/dL): 4, ISS: Stage I, High-risk cytogenetics: Absent, LDH: Normal) - Signed by Gwyn Leos, MD on 06/06/2023 Stage prefix: Initial diagnosis Beta 2 microglobulin range (mg/L): Less than 3.5 Albumin range (g/dL):  Greater than or equal to 3.5 Cytogenetics: t(11;14) translocation   07/27/2023 -  Chemotherapy   Patient is on Treatment Plan : MYELOMA NEWLY DIAGNOSED TRANSPLANT CANDIDATE DaraVRd (Daratumumab  SQ) with weekly bortezomib  (D1,8,15) q21d x 6 Cycles (Induction/Consolidation)       HISTORY OF PRESENTING ILLNESS: Patient ambulating-independently. Accompanied by wife.   Jimmy [name] 68 y.o.  male with Multiple Myeloma [dx: march 2025] with multiple bone lesions-is here to proceed with chemotherapy is here for a follow-up.   In the interim patient underwent chemo education. Ans also had revlimid  approved.   Patient continues to complain of on going fatigue.  Otherwise denies any tingling and numbness in the extremities.  No falls.   Review of Systems  Constitutional:  Positive for malaise/fatigue. Negative for chills, diaphoresis, fever and weight loss.  HENT:  Negative for nosebleeds and sore throat.   Eyes:  Negative for double vision.  Respiratory:  Negative for cough, hemoptysis, sputum production, shortness of breath and wheezing.   Cardiovascular:  Negative for chest pain, palpitations, orthopnea and leg swelling.  Gastrointestinal:  Negative for abdominal pain, blood in stool, constipation, diarrhea, heartburn, melena, nausea and vomiting.  Genitourinary:  Negative for dysuria, frequency and urgency.  Musculoskeletal:  Negative for back pain and joint pain.  Skin: Negative.  Negative for itching and rash.  Neurological:  Negative for dizziness, tingling, focal weakness, weakness and headaches.  Endo/Heme/Allergies:  Does not bruise/bleed easily.  Psychiatric/Behavioral:  Negative for depression. The patient is not nervous/anxious and does not have insomnia.     MEDICAL HISTORY:  Past Medical History:  Diagnosis Date   Atrial fibrillation (HCC)    had loop recorder, PAcs after DCCV 09/2019   Atrial flutter (HCC)  HLD (hyperlipidemia)    On amiodarone therapy 09/15/2019    Pain of right lower leg 01/02/2022    SURGICAL HISTORY: Past Surgical History:  Procedure Laterality Date   CARDIOVERSION     2021 for Afib in [place]   CHOLECYSTECTOMY     IR BONE MARROW BIOPSY & ASPIRATION  05/18/2023   MINIMALLY INVASIVE MAZE PROCEDURE     done in [place]  Dr Ardeth Beckers. [facility]   stye Right 09/2022    SOCIAL HISTORY: Social History   Socioeconomic History   Marital status: Married    Spouse name: Not on file   Number of children: Not on file   Years of education: Not on file   Highest education level: Bachelor's degree (e.g., BA, AB, BS)  Occupational History   Not on file  Tobacco Use   Smoking status: Never   Smokeless tobacco: Never  Vaping Use   Vaping status: Never Used  Substance and Sexual Activity   Alcohol use: Never   Drug use: Not Currently   Sexual activity: Not Currently  Other Topics Concern   Not on file  Social History Narrative   2 sons and 1 daughter    Married    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    No guns, wears seat belt, safe in relationship   Vegan x 8-9 years as of 07/06/20    Social Drivers of Corporate investment banker Strain: Low Risk  (04/16/2023)   Overall Financial Resource Strain (CARDIA)    Difficulty of Paying Living Expenses: Not hard at all  Food Insecurity: No Food Insecurity (04/16/2023)   Hunger Vital Sign    Worried About Running Out of Food in the Last Year: Never true    Ran Out of Food in the Last Year: Never true  Transportation Needs: No Transportation Needs (04/16/2023)   PRAPARE - Administrator, Civil Service (Medical): No    Lack of Transportation (Non-Medical): No  Physical Activity: Unknown (04/16/2023)   Exercise Vital Sign    Days of Exercise per Week: 0 days    Minutes of Exercise per Session: Not on file  Stress: No Stress Concern Present (04/16/2023)   [name]    Feeling of Stress : Not at all  Social  Connections: Moderately Integrated (04/16/2023)   Social Connection and Isolation Panel [NHANES]    Frequency of Communication with Friends and Family: Twice a week    Frequency of Social Gatherings with Friends and Family: Twice a week    Attends Religious Services: 1 to 4 times per year    Active Member of [employer] or Organizations: No    Attends Banker Meetings: Never    Marital Status: Married  Catering manager Violence: Not At Risk (04/16/2023)   Humiliation, Afraid, Rape, and Kick questionnaire    Fear of Current or Ex-Partner: No    Emotionally Abused: No    Physically Abused: No    Sexually Abused: No    FAMILY HISTORY: Family History  Problem Relation Age of Onset   COPD Mother    Atrial fibrillation Mother        ? due to Afib died 49   Cancer Father        lung cancer smoker age 88    Heart disease Father        bypass at 50    Atrial fibrillation Sister  ALLERGIES:  has no known allergies.  MEDICATIONS:  Current Outpatient Medications  Medication Sig Dispense Refill   acyclovir  (ZOVIRAX ) 400 MG tablet Take 1 tablet (400 mg total) by mouth 2 (two) times daily. 120 tablet 2   ASPIRIN 81 PO Take 81 mg by mouth daily.     cyanocobalamin  1000 MCG tablet Take 2,000 mcg by mouth.     dexamethasone  (DECADRON ) 4 MG tablet Take 5 tabs (20 mg) weekly the day after daratumumab  for 12 weeks. Take with breakfast. 20 tablet 5   lenalidomide  (REVLIMID ) 25 MG capsule Take 1 capsule (25 mg total) by mouth daily. Take for 14 days, then hold for 7 days. Repeat every 21 days. 14 capsule 0   Multiple Vitamins-Minerals (MULTIVITAMIN WOMEN PO) Take by mouth daily.     Omega-3 1400 MG CAPS      ondansetron  (ZOFRAN ) 8 MG tablet One pill every 8 hours as needed for nausea/vomitting. 40 tablet 1   prochlorperazine  (COMPAZINE ) 10 MG tablet Take 1 tablet (10 mg total) by mouth every 6 (six) hours as needed for nausea or vomiting. 40 tablet 1   Turmeric (CURCUMIN 95) 500 MG CAPS  Take 500 mg by mouth 2 (two) times daily.     No current facility-administered medications for this visit.   Facility-Administered Medications Ordered in Other Visits  Medication Dose Route Frequency Provider Last Rate Last Admin   bortezomib  SQ (VELCADE ) chemo injection (2.5mg /mL concentration) 3 mg  1.3 mg/m2 (Treatment Plan Recorded) Subcutaneous Once Saysha Menta R, MD       daratumumab -hyaluronidase -fihj (DARZALEX  FASPRO) 1800-30000 MG-UT/15ML chemo SQ injection 1,800 mg  1,800 mg Subcutaneous Once Briahna Pescador R, MD       PHYSICAL EXAMINATION:   Vitals:   07/27/23 0905  BP: 109/82  Pulse: 82  Resp: 16  Temp: 98.2 F (36.8 C)  SpO2: 99%    Filed Weights   07/27/23 0905  Weight: 229 lb (103.9 kg)    Positive for bilateral gynecomastia.   Physical Exam Vitals and nursing note reviewed.  HENT:     Head: Normocephalic and atraumatic.     Mouth/Throat:     Pharynx: Oropharynx is clear.  Eyes:     Extraocular Movements: Extraocular movements intact.     Pupils: Pupils are equal, round, and reactive to light.  Cardiovascular:     Rate and Rhythm: Normal rate and regular rhythm.  Abdominal:     Palpations: Abdomen is soft.  Musculoskeletal:        General: Normal range of motion.     Cervical back: Normal range of motion.  Skin:    General: Skin is warm.  Neurological:     General: No focal deficit present.     Mental Status: He is alert and oriented to person, place, and time.  Psychiatric:        Behavior: Behavior normal.        Judgment: Judgment normal.     LABORATORY DATA:  I have reviewed the data as listed Lab Results  Component Value Date   WBC 6.7 07/27/2023   HGB 12.7 (L) 07/27/2023   HCT 38.0 (L) 07/27/2023   MCV 92.7 07/27/2023   PLT 182 07/27/2023   Recent Labs    04/16/23 1208 07/03/23 0914 07/27/23 0826  NA 135 138 135  K 4.0 3.7 4.0  CL 99 103 101  CO2 26 27 26   GLUCOSE 95 119* 96  BUN 7* 10 10  CREATININE 0.74  0.81 0.73  CALCIUM 9.4 9.3 9.2  GFRNONAA >60 >60 >60  PROT 6.4* 6.3* 6.3*  ALBUMIN 4.0 3.8 3.7  AST 25 20 19   ALT 29 24 21   ALKPHOS 73 78 76  BILITOT 0.8 0.7 0.6   No results found.   Lab Results  Component Value Date   KPAFRELGTCHN 490.9 (H) 07/03/2023   KPAFRELGTCHN 439.1 (H) 04/16/2023   LAMBDASER 4.4 (L) 07/03/2023   LAMBDASER 4.8 (L) 04/16/2023   KAPLAMBRATIO 111.57 (H) 07/03/2023   KAPLAMBRATIO 91.48 (H) 04/16/2023     Multiple myeloma in relapse (HCC) # LIGHT CHAIN MONOCLONAL GAMMOPATHY -DEC 2024-[PCP-SPEP-NEGATIVE; Hb 13; GFR-> 60 ca-WN; However, random UPEP- positive for M protein/Bence-Jones- Protein positive; kappa type.  JAN 2025- SPEP- NEG; K/l= 91 [K=400]. Dx= includes smoldering MM vs active MM. FEB 2025- Bone marrow- BONE MARROW, ASPIRATE, CLOT, CORE:  -  Kappa restricted plasma cell neoplasm involving 50 to 90% of the  cellular marrow (patchy areas; average 70%)   cytogenetics: 11:14; 13 q- [standard risk] Negative Congo stain. MARCH 2025-PET Numerous lytic myelomatous bone lesions throughout the axial and appendicular skeleton noted on the CT scan without associated discrete significant hypermetabolism.  There are 2 slightly hypermetabolic foci in noted in the right femur which appear to correlate with CT abnormalities. Low level hypermetabolism with SUV max of 2.26 and 2.20. A few other scattered bone lesions have similar FDG uptake. No hypermetabolic soft tissue masses or adenopathy. S/P evaluation with Dr.Choi [facility]- bone marrow evaluation]. Discussed with Dr.Choi. TRANSPLANT ELIGIBLE.   # Recommend proceeding with Dara-RVD [q 21 d cycle- Griffin trial- Labs-CBC/chemistries were reviewed with the patient.  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   # Multiple bone lesion-- plan Zometa recommend dental clearance. -discussed the role of Zometa to decrease skeletal related events [ Discussed the potential side effects including but not  limited to-infusion reaction; hypocalcemia; and Osteo-necrosis of jaw. Recommend ca+Vit D BID. [Vit D 1000/d; 5000 q OD]. Will start after dental clearance.   # History of a flutter [s/p ablation in [place] ]-no anticoagulation; regular rhythm- stable.   # DVT-ID Prophylaxis: asprin/acyclovir   # # IV access:PIV  # 1-4 cycles-SQ Dara-velcade  weekly x4 cycles; 5-6 dara-q2 w; Velade-weekly; Zometa; Starting 5/9-in 3w- q 2 w appt- MD-re-adjust  PS # DISPOSITION: # chemo today # 1 week- MD; labs- cbc/cmp; Chemo # 2 weeks- MD; labs- cbc/cmp; Chemo; MM panel; K/L light chains- Zometa-  Dr.B        All questions were answered. The patient knows to call the clinic with any problems, questions or concerns.    Gwyn Leos, MD 07/27/2023 10:57 AM

## 2023-07-27 NOTE — Assessment & Plan Note (Addendum)
#   LIGHT CHAIN MONOCLONAL GAMMOPATHY -DEC 2024-[PCP-SPEP-NEGATIVE; Hb 13; GFR-> 60 ca-WN; However, random UPEP- positive for M protein/Bence-Jones- Protein positive; kappa type.  JAN 2025- SPEP- NEG; K/l= 91 [K=400]. Dx= includes smoldering MM vs active MM. FEB 2025- Bone marrow- BONE MARROW, ASPIRATE, CLOT, CORE:  -  Kappa restricted plasma cell neoplasm involving 50 to 90% of the  cellular marrow (patchy areas; average 70%)   cytogenetics: 11:14; 13 q- [standard risk] Negative Congo stain. MARCH 2025-PET Numerous lytic myelomatous bone lesions throughout the axial and appendicular skeleton noted on the CT scan without associated discrete significant hypermetabolism.  There are 2 slightly hypermetabolic foci in noted in the right femur which appear to correlate with CT abnormalities. Low level hypermetabolism with SUV max of 2.26 and 2.20. A few other scattered bone lesions have similar FDG uptake. No hypermetabolic soft tissue masses or adenopathy. S/P evaluation with Dr.Choi [facility]- bone marrow evaluation]. Discussed with Dr.Choi. TRANSPLANT ELIGIBLE.   # Recommend proceeding with Dara-RVD [q 21 d cycle- Griffin trial- Labs-CBC/chemistries were reviewed with the patient.  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   # Multiple bone lesion-- plan Zometa recommend dental clearance. -discussed the role of Zometa to decrease skeletal related events [ Discussed the potential side effects including but not limited to-infusion reaction; hypocalcemia; and Osteo-necrosis of jaw. Recommend ca+Vit D BID. [Vit D 1000/d; 5000 q OD]. Will start after dental clearance.   # History of a flutter [s/p ablation in [place] ]-no anticoagulation; regular rhythm- stable.   # DVT-ID Prophylaxis: asprin/acyclovir   # # IV access:PIV  # 1-4 cycles-SQ Dara-velcade  weekly x4 cycles; 5-6 dara-q2 w; Velade-weekly; Zometa; Starting 5/9-in 3w- q 2 w appt- MD-re-adjust  PS # DISPOSITION: # chemo today # 1  week- MD; labs- cbc/cmp; Chemo # 2 weeks- MD; labs- cbc/cmp; Chemo; MM panel; K/L light chains- Zometa-  Dr.B

## 2023-07-27 NOTE — Telephone Encounter (Signed)
 Dr. Creed Dodrill asked me to call patient and offer the option of taking his daratumab pre-medications at home to save time in clinic. Patient would like to proceed with that option.  Instructions provided over the phone and sent via mychart. Prescription need were sent in for the patient.

## 2023-07-27 NOTE — Addendum Note (Signed)
 Addended by: Dominique Frieze A on: 07/27/2023 11:13 AM   Modules accepted: Orders

## 2023-07-27 NOTE — Patient Instructions (Signed)
 [facility] - A DEPT OF [facility]  Discharge Instructions: Thank you for choosing [facility] to provide your oncology and hematology care.  If you have a lab appointment with the Cancer Center, please go directly to the Cancer Center and check in at the registration area.  Wear comfortable clothing and clothing appropriate for easy access to any Portacath or PICC line.   We strive to give you quality time with your provider. You may need to reschedule your appointment if you arrive late (15 or more minutes).  Arriving late affects you and other patients whose appointments are after yours.  Also, if you miss three or more appointments without notifying the office, you may be dismissed from the clinic at the provider's discretion.      For prescription refill requests, have your pharmacy contact our office and allow 72 hours for refills to be completed.     To help prevent nausea and vomiting after your treatment, we encourage you to take your nausea medication as directed.  BELOW ARE SYMPTOMS THAT SHOULD BE REPORTED IMMEDIATELY: *FEVER GREATER THAN 100.4 F (38 C) OR HIGHER *CHILLS OR SWEATING *NAUSEA AND VOMITING THAT IS NOT CONTROLLED WITH YOUR NAUSEA MEDICATION *UNUSUAL SHORTNESS OF BREATH *UNUSUAL BRUISING OR BLEEDING *URINARY PROBLEMS (pain or burning when urinating, or frequent urination) *BOWEL PROBLEMS (unusual diarrhea, constipation, pain near the anus) TENDERNESS IN MOUTH AND THROAT WITH OR WITHOUT PRESENCE OF ULCERS (sore throat, sores in mouth, or a toothache) UNUSUAL RASH, SWELLING OR PAIN   Items with * indicate a potential emergency and should be followed up as soon as possible or go to the Emergency Department if any problems should occur.  Please show the CHEMOTHERAPY ALERT CARD or IMMUNOTHERAPY ALERT CARD at check-in to the Emergency Department and triage nurse.  Should you have questions after your visit or need to  cancel or reschedule your appointment, please contact [facility] - A DEPT OF [facility]  [phone] and follow the prompts.  Office hours are 8:00 a.m. to 4:30 p.m. Monday - Friday. Please note that voicemails left after 4:00 p.m. may not be returned until the following business day.  We are closed weekends and major holidays. You have access to a nurse at all times for urgent questions. Please call the main number to the clinic [phone] and follow the prompts.  For any non-urgent questions, you may also contact your provider using MyChart. We now offer e-Visits for anyone 64 and older to request care online for non-urgent symptoms. For details visit [email or web address].   Also download the MyChart app! Go to the app store, search "MyChart", open the app, select [facility], and log in with your MyChart username and password.

## 2023-07-27 NOTE — Progress Notes (Signed)
 Patient here for follow up. Patient states there is a spot on the back of right thigh that is uncomfortable.

## 2023-07-28 LAB — PRETREATMENT RBC PHENOTYPE: DAT, IgG: NEGATIVE

## 2023-07-31 ENCOUNTER — Telehealth: Payer: Self-pay

## 2023-07-31 NOTE — Telephone Encounter (Signed)
Telephone call to patient for follow up after receiving first infusion.   Patient states infusion went great.  States eating good and drinking plenty of fluids.   Denies any nausea or vomiting.  Encouraged patient to call for any concerns or questions. 

## 2023-08-01 ENCOUNTER — Encounter: Payer: Self-pay | Admitting: Internal Medicine

## 2023-08-01 ENCOUNTER — Ambulatory Visit (INDEPENDENT_AMBULATORY_CARE_PROVIDER_SITE_OTHER): Admitting: Sports Medicine

## 2023-08-01 ENCOUNTER — Encounter: Payer: Self-pay | Admitting: Sports Medicine

## 2023-08-01 VITALS — BP 112/78 | HR 100 | Temp 97.8°F | Resp 20 | Ht 71.0 in | Wt 231.0 lb

## 2023-08-01 DIAGNOSIS — C9002 Multiple myeloma in relapse: Secondary | ICD-10-CM | POA: Diagnosis not present

## 2023-08-01 DIAGNOSIS — M6289 Other specified disorders of muscle: Secondary | ICD-10-CM | POA: Diagnosis not present

## 2023-08-01 DIAGNOSIS — J302 Other seasonal allergic rhinitis: Secondary | ICD-10-CM | POA: Diagnosis not present

## 2023-08-01 DIAGNOSIS — I4892 Unspecified atrial flutter: Secondary | ICD-10-CM

## 2023-08-01 NOTE — Progress Notes (Signed)
 Careteam: Patient Care Team: Tye Gall, MD as PCP - General (Internal Medicine) Gwyn Leos, MD as Consulting Physician (Oncology)  PLACE OF SERVICE:  [facility]  Advanced Directive information    No Known Allergies  Chief Complaint  Patient presents with   Establish Care    New Patient Appointment.     Discussed the use of AI scribe software for clinical note transcription with the patient, who gave verbal consent to proceed.  History of Present Illness  Jimmy [name] "Courtenay Distel" is a 68 year old male with multiple myeloma and atrial flutter who presents for establishment of care.  He is currently undergoing treatment for multiple myeloma, having received one infusion so far. He experiences   itchy scalp and some fatigue as side effects but has no nausea or vomiting. His appetite remains good, though he has difficulty chewing due to muscle fatigue. The initial presentation of his cancer was extreme muscle fatigue, which began in June of the previous year. He describes the fatigue   after minimal exertion. A workup revealed low LDH levels and borderline anemia, but no significant weight loss or kidney function issues.  He is scheduled for weekly infusions and is taking calcium and vitamin D  supplements for bone strength. He has received dental clearance for treatment.  He has a history of atrial flutter and atrial fibrillation, having undergone surgical ablation in [place] . He has  cardiac loop recorder, and is consulting with an electrophysiologist in [place]  virtually. He has appt with them this afternoon. He feels a 'flutter' but no significant symptoms like chest pain, dizziness, or lightheadedness.  He experiences persistent muscle fatigue, particularly in his arms and when chewing, which he attributes to his cancer diagnosis. The fatigue is severe enough to impact his ability to walk and perform daily activities. He also reports a sensation  which he associates  with peripheral neuropathy. This sensation is persistent but varies in intensity.  His sleep pattern has been irregular since retiring in December, with a natural tendency to stay awake at night. He is attempting to adjust this pattern. No depression, but he acknowledges some anxiety related to his health conditions. He maintains interest in online activities and has no issues with balance or falls.  He recently completed the shingles vaccine series      Review of Systems:  Review of Systems  Constitutional:  Negative for chills and fever.  HENT:  Negative for congestion and sore throat.   Eyes:  Negative for double vision.  Respiratory:  Negative for cough, sputum production and shortness of breath.   Cardiovascular:  Negative for chest pain, palpitations and leg swelling.  Gastrointestinal:  Negative for abdominal pain, heartburn and nausea.  Genitourinary:  Negative for dysuria, frequency and hematuria.  Musculoskeletal:  Positive for myalgias. Negative for falls.  Skin:  Positive for itching.  Neurological:  Negative for dizziness.   Negative unless indicated in HPI.   Past Medical History:  Diagnosis Date   Atrial fibrillation (HCC)    had loop recorder, PAcs after DCCV 09/2019   Atrial flutter (HCC)    HLD (hyperlipidemia)    On amiodarone therapy 09/15/2019   Pain of right lower leg 01/02/2022   Past Surgical History:  Procedure Laterality Date   CARDIOVERSION     2021 for Afib in [place]   CHOLECYSTECTOMY     IR BONE MARROW BIOPSY & ASPIRATION  05/18/2023   MINIMALLY INVASIVE MAZE PROCEDURE     done in [place]  Dr  Delta Fila   stye Right 09/2022   Social History:   reports that he has never smoked. He has never used smokeless tobacco. He reports that he does not currently use drugs. He reports that he does not drink alcohol.  Family History  Problem Relation Age of Onset   COPD Mother    Atrial fibrillation Mother        ? due to Afib died 38   Cancer Father        lung  cancer smoker age 49    Heart disease Father        bypass at 25    Atrial fibrillation Sister     Medications: Patient's Medications  New Prescriptions   No medications on file  Previous Medications   ACETAMINOPHEN  (TYLENOL ) 325 MG TABLET    Take 650 mg by mouth once a week. Take 1 hour prior to infusion appointment.   ACYCLOVIR  (ZOVIRAX ) 400 MG TABLET    Take 1 tablet (400 mg total) by mouth 2 (two) times daily.   ASPIRIN 81 PO    Take 81 mg by mouth daily.   CYANOCOBALAMIN  1000 MCG TABLET    Take 2,000 mcg by mouth.   DEXAMETHASONE  (DECADRON ) 4 MG TABLET    Take 5 tabs (20mg ) by mouth 1 hour prior to weekly infusion appointment and take 5 tabs (20mg ) the day after weekly infusion appointment. Take with with food .   DIPHENHYDRAMINE  (BENADRYL ) 50 MG CAPSULE    Take 50 mg by mouth once a week. Take 1 hour prior to infusion appointment.   LENALIDOMIDE  (REVLIMID ) 25 MG CAPSULE    Take 1 capsule (25 mg total) by mouth daily. Take for 14 days, then hold for 7 days. Repeat every 21 days.   MONTELUKAST  (SINGULAIR ) 10 MG TABLET    Take 1 tablet (10 mg total) by mouth once a week. Take 1 hour prior to infusion appointment.   MULTIPLE VITAMIN (CALCIUM COMPLEX PO)    Take by mouth daily.   MULTIPLE VITAMINS-MINERALS (MULTIVITAMIN WOMEN PO)    Take by mouth daily.   OMEGA-3 1400 MG CAPS       ONDANSETRON  (ZOFRAN ) 8 MG TABLET    One pill every 8 hours as needed for nausea/vomitting.   PROCHLORPERAZINE  (COMPAZINE ) 10 MG TABLET    Take 1 tablet (10 mg total) by mouth every 6 (six) hours as needed for nausea or vomiting.   TURMERIC (CURCUMIN 95) 500 MG CAPS    Take 500 mg by mouth 2 (two) times daily.   VITAMIN D -VITAMIN K (VITAMIN K2-VITAMIN D3 PO)    Take by mouth daily.  Modified Medications   No medications on file  Discontinued Medications   No medications on file    Physical Exam: Vitals:   08/01/23 0842  BP: 112/78  Pulse: 100  Resp: 20  Temp: 97.8 F (36.6 C)  SpO2: 95%  Weight:  231 lb (104.8 kg)  Height: 5\' 11"  (1.803 m)   Body mass index is 32.22 kg/m. BP Readings from Last 3 Encounters:  08/01/23 112/78  07/27/23 111/82  07/27/23 109/82   Wt Readings from Last 3 Encounters:  08/01/23 231 lb (104.8 kg)  07/27/23 229 lb (103.9 kg)  07/16/23 228 lb (103.4 kg)    Physical Exam Constitutional:      Appearance: Normal appearance.  HENT:     Head: Normocephalic and atraumatic.  Cardiovascular:     Rate and Rhythm: Normal rate and regular rhythm.  Pulses: Normal pulses.     Heart sounds: Normal heart sounds.  Pulmonary:     Effort: No respiratory distress.     Breath sounds: No stridor. No wheezing or rales.  Abdominal:     General: Bowel sounds are normal. There is no distension.     Palpations: Abdomen is soft.     Tenderness: There is no abdominal tenderness. There is no right CVA tenderness or guarding.  Musculoskeletal:        General: No swelling.  Neurological:     Mental Status: He is alert. Mental status is at baseline.     Motor: No weakness.     Labs reviewed: Basic Metabolic Panel: Recent Labs    03/26/23 1535 04/16/23 1208 07/03/23 0914 07/27/23 0826  NA 139 135 138 135  K 4.3 4.0 3.7 4.0  CL 103 99 103 101  CO2 27 26 27 26   GLUCOSE 93 95 119* 96  BUN 7 7* 10 10  CREATININE 0.78 0.74 0.81 0.73  CALCIUM 9.1 9.4 9.3 9.2  TSH 3.72  --   --   --    Liver Function Tests: Recent Labs    04/16/23 1208 07/03/23 0914 07/27/23 0826  AST 25 20 19   ALT 29 24 21   ALKPHOS 73 78 76  BILITOT 0.8 0.7 0.6  PROT 6.4* 6.3* 6.3*  ALBUMIN 4.0 3.8 3.7   No results for input(s): "LIPASE", "AMYLASE" in the last 8760 hours. No results for input(s): "AMMONIA" in the last 8760 hours. CBC: Recent Labs    05/18/23 0737 07/03/23 0914 07/27/23 0826  WBC 8.7 8.0 6.7  NEUTROABS 5.5 4.8 3.7  HGB 13.0 13.4 12.7*  HCT 38.8* 39.7 38.0*  MCV 91.7 92.1 92.7  PLT 199 171 182   Lipid Panel: Recent Labs    05/21/23 0949  CHOL 173   HDL 31.70*  LDLCALC 113*  TRIG 141.0  CHOLHDL 5   TSH: Recent Labs    03/26/23 1535  TSH 3.72   A1C: Lab Results  Component Value Date   HGBA1C 6.1 03/26/2023    Assessment and Plan Assessment & Plan   1. Multiple myeloma in relapse (HCC) (Primary)  2. Muscle fatigue Follow up with oncology Pt denies fevers, night sweats, weight loss   3. Seasonal allergies  Cont with singulair   Atrial flutter He has appt with EP today Instructed patient to have records sent over to our office  Other orders - Vitamin D -Vitamin K (VITAMIN K2-VITAMIN D3 PO); Take by mouth daily. - Multiple Vitamin (CALCIUM COMPLEX PO); Take by mouth daily.

## 2023-08-02 ENCOUNTER — Telehealth: Payer: Self-pay | Admitting: *Deleted

## 2023-08-02 ENCOUNTER — Other Ambulatory Visit: Payer: Self-pay | Admitting: Nurse Practitioner

## 2023-08-02 MED ORDER — HYDROXYZINE HCL 10 MG PO TABS: 10.0000 mg | ORAL_TABLET | Freq: Three times a day (TID) | ORAL | 0 refills | Status: AC | PRN

## 2023-08-02 NOTE — Telephone Encounter (Signed)
 The patient has called and said he just started his chemo last week and he has a head rash on his face upper shoulders and he wanted to know what he can do to get it gone, itchy some. I spoke to Lauren and she said that she could put in some Atarax  and so I did call the patient and let him know about that as well as he says that his wife makes up her own lotion and he wondered if he could use that and I told him since I do not know what all the ingredients in for the lotion so if you can get a list of everything that is in it and you can take it tomorrow when you see Dr. Valentine Gasmen and see if that lotion is okay to take.  He is agreeable with that.

## 2023-08-02 NOTE — Telephone Encounter (Signed)
 Patient called today again and now he has swelling right at his eyes near the teardrops and he wanted to know if he could put a cold compress on it and the answer is yes.   I also went and spoke to Lauren the nurse practitioner she said that Atarax  is a good tablet to take and hopefully will help with the swelling as well as the itching.  He will get sleepy with the Atarax  so if that is okay with him he can take it up to 3 times a day and if he does not want to take it because he does not want to get sleepy then he could always get Claritin over-the-counter and use that.  The patient thought that the medicine that we were called in was going to be a cream and I talked to him saying you asked me about if you could use lotion that your wife makes and we did not know what the ingredients has in it so we did not want you to do that.  He said that he understands and he just thought it was going to be a cream since it has going on his face.

## 2023-08-02 NOTE — Progress Notes (Signed)
 Patient complaining of rash and itching. He will see Dr Valentine Gasmen tomorrow as part of his treatment schedule. I've sent in prescription for atarax .

## 2023-08-03 ENCOUNTER — Encounter: Payer: Self-pay | Admitting: Internal Medicine

## 2023-08-03 ENCOUNTER — Ambulatory Visit

## 2023-08-03 ENCOUNTER — Inpatient Hospital Stay: Attending: Internal Medicine

## 2023-08-03 ENCOUNTER — Inpatient Hospital Stay

## 2023-08-03 ENCOUNTER — Inpatient Hospital Stay (HOSPITAL_BASED_OUTPATIENT_CLINIC_OR_DEPARTMENT_OTHER): Admitting: Internal Medicine

## 2023-08-03 ENCOUNTER — Other Ambulatory Visit

## 2023-08-03 ENCOUNTER — Ambulatory Visit: Admitting: Internal Medicine

## 2023-08-03 VITALS — BP 92/72 | HR 113 | Temp 100.9°F | Resp 20 | Ht 71.0 in | Wt 226.4 lb

## 2023-08-03 DIAGNOSIS — C9002 Multiple myeloma in relapse: Secondary | ICD-10-CM | POA: Insufficient documentation

## 2023-08-03 DIAGNOSIS — Z7952 Long term (current) use of systemic steroids: Secondary | ICD-10-CM | POA: Insufficient documentation

## 2023-08-03 DIAGNOSIS — Z79624 Long term (current) use of inhibitors of nucleotide synthesis: Secondary | ICD-10-CM | POA: Diagnosis not present

## 2023-08-03 DIAGNOSIS — Z7969 Long term (current) use of other immunomodulators and immunosuppressants: Secondary | ICD-10-CM | POA: Insufficient documentation

## 2023-08-03 DIAGNOSIS — Z7982 Long term (current) use of aspirin: Secondary | ICD-10-CM | POA: Insufficient documentation

## 2023-08-03 DIAGNOSIS — Z9481 Bone marrow transplant status: Secondary | ICD-10-CM | POA: Insufficient documentation

## 2023-08-03 DIAGNOSIS — Z79899 Other long term (current) drug therapy: Secondary | ICD-10-CM | POA: Insufficient documentation

## 2023-08-03 DIAGNOSIS — Z5112 Encounter for antineoplastic immunotherapy: Secondary | ICD-10-CM | POA: Insufficient documentation

## 2023-08-03 DIAGNOSIS — Z7961 Long term (current) use of immunomodulator: Secondary | ICD-10-CM | POA: Insufficient documentation

## 2023-08-03 LAB — CBC WITH DIFFERENTIAL (CANCER CENTER ONLY)
Abs Immature Granulocytes: 0.04 10*3/uL (ref 0.00–0.07)
Basophils Absolute: 0 10*3/uL (ref 0.0–0.1)
Basophils Relative: 0 %
Eosinophils Absolute: 0.3 10*3/uL (ref 0.0–0.5)
Eosinophils Relative: 5 %
HCT: 40.2 % (ref 39.0–52.0)
Hemoglobin: 13.4 g/dL (ref 13.0–17.0)
Immature Granulocytes: 1 %
Lymphocytes Relative: 4 %
Lymphs Abs: 0.3 10*3/uL — ABNORMAL LOW (ref 0.7–4.0)
MCH: 30.5 pg (ref 26.0–34.0)
MCHC: 33.3 g/dL (ref 30.0–36.0)
MCV: 91.4 fL (ref 80.0–100.0)
Monocytes Absolute: 0.4 10*3/uL (ref 0.1–1.0)
Monocytes Relative: 5 %
Neutro Abs: 5.6 10*3/uL (ref 1.7–7.7)
Neutrophils Relative %: 85 %
Platelet Count: 129 10*3/uL — ABNORMAL LOW (ref 150–400)
RBC: 4.4 MIL/uL (ref 4.22–5.81)
RDW: 15.2 % (ref 11.5–15.5)
WBC Count: 6.6 10*3/uL (ref 4.0–10.5)
nRBC: 0 % (ref 0.0–0.2)

## 2023-08-03 LAB — CMP (CANCER CENTER ONLY)
ALT: 32 U/L (ref 0–44)
AST: 19 U/L (ref 15–41)
Albumin: 3.2 g/dL — ABNORMAL LOW (ref 3.5–5.0)
Alkaline Phosphatase: 64 U/L (ref 38–126)
Anion gap: 7 (ref 5–15)
BUN: 9 mg/dL (ref 8–23)
CO2: 26 mmol/L (ref 22–32)
Calcium: 7.9 mg/dL — ABNORMAL LOW (ref 8.9–10.3)
Chloride: 100 mmol/L (ref 98–111)
Creatinine: 0.92 mg/dL (ref 0.61–1.24)
GFR, Estimated: >60 mL/min (ref >60)
Glucose, Bld: 146 mg/dL — ABNORMAL HIGH (ref 70–99)
Potassium: 4.3 mmol/L (ref 3.5–5.1)
Sodium: 133 mmol/L — ABNORMAL LOW (ref 135–145)
Total Bilirubin: 1 mg/dL (ref 0.0–1.2)
Total Protein: 5.4 g/dL — ABNORMAL LOW (ref 6.5–8.1)

## 2023-08-03 MED ORDER — PREDNISONE 20 MG PO TABS: ORAL_TABLET | ORAL | 0 refills | Status: DC

## 2023-08-03 NOTE — Progress Notes (Signed)
 [facility] CONSULT NOTE  Patient Care Team: Tye Gall, MD as PCP - General (Internal Medicine) Gwyn Leos, MD as Consulting Physician (Oncology)  CHIEF COMPLAINTS/PURPOSE OF CONSULTATION: Multiple Myeloma   Oncology History Overview Note  # LIGHT CHAIN MONOCLONAL GAMMOPATHY -DEC 2024-[PCP-SPEP-NEGATIVE; Hb 13; GFR-> 60 ca-WN; However, random UPEP- positive for M protein/Bence-Jones- Protein positive; kappa type.  JAN 2025- SPEP- NEG; K/l= 91 [K=400]. Dx= includes smoldering MM vs active MM. 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)   06/05/2023 Cancer Staging   Staging form: Plasma Cell Myeloma and Plasma Cell Disorders, AJCC 8th Edition - Clinical: Albumin (g/dL): 4, ISS: Stage II, High-risk cytogenetics: Absent, LDH: Normal - Signed by Gwyn Leos, MD on 06/05/2023 Albumin range (g/dL): Greater than or equal to 3.5 Cytogenetics: t(11;14) translocation   06/06/2023 Cancer Staging   Staging form: Plasma Cell Myeloma and Plasma Cell Disorders, AJCC 8th Edition - Clinical: RISS Stage I (Beta-2 -microglobulin (mg/L): 2.5, Albumin (g/dL): 4, ISS: Stage I, High-risk cytogenetics: Absent, LDH: Normal) - Signed by Gwyn Leos, MD on 06/06/2023 Stage prefix: Initial diagnosis Beta 2 microglobulin range (mg/L): Less than 3.5 Albumin  range (g/dL): Greater than or equal to 3.5 Cytogenetics: t(11;14) translocation   07/27/2023 -  Chemotherapy   Patient is on Treatment Plan : MYELOMA NEWLY DIAGNOSED TRANSPLANT CANDIDATE DaraVRd (Daratumumab  SQ) with weekly bortezomib  (D1,8,15) q21d x 6 Cycles (Induction/Consolidation)      HISTORY OF PRESENTING ILLNESS: Patient ambulating-independently. Accompanied by wife.   Jimmy [name] 68 y.o.  male with Multiple Myeloma [dx: march 2025] with multiple bone lesions-is here to proceed with chemotherapy is here for a follow-up.   Itchy scalp - 3-4 days later- progressed yesterday- rash face /and swelling around the eyes. S/p 2 doses of hydroxyzine . Also s/p claritin.   Mild rash on the upper torso/ in the anterior. Pt states he is currently in afib, temp 100.9  Patient continues to complain of on going fatigue.  Otherwise denies any tingling and numbness in the extremities.  No falls.   Review of Systems  Constitutional:  Positive for malaise/fatigue. Negative for chills, diaphoresis, fever and weight loss.  HENT:  Negative for nosebleeds and sore throat.   Eyes:  Negative for double vision.  Respiratory:  Negative for cough, hemoptysis, sputum production, shortness of breath and wheezing.   Cardiovascular:  Negative for chest pain, palpitations, orthopnea and leg swelling.  Gastrointestinal:  Negative for abdominal pain, blood in stool, constipation, diarrhea, heartburn, melena, nausea and vomiting.  Genitourinary:  Negative for dysuria, frequency and urgency.  Musculoskeletal:  Negative for back pain and joint pain.  Skin: Negative.  Negative for itching and rash.  Neurological:  Negative for dizziness, tingling, focal weakness, weakness and headaches.  Endo/Heme/Allergies:  Does not bruise/bleed easily.  Psychiatric/Behavioral:  Negative for depression. The patient is not nervous/anxious and does not have insomnia.     MEDICAL  HISTORY:  Past Medical History:  Diagnosis Date    Atrial fibrillation (HCC)    had loop recorder, PAcs after DCCV 09/2019   Atrial flutter (HCC)    Fatigue    HLD (hyperlipidemia)    On amiodarone therapy 09/15/2019   Pain of right lower leg 01/02/2022    SURGICAL HISTORY: Past Surgical History:  Procedure Laterality Date   afib surgical ablation  2021   CARDIOVERSION     2021 for Afib in [place]   CHOLECYSTECTOMY     GALLBLADDER SURGERY  2006   IR BONE MARROW BIOPSY & ASPIRATION  05/18/2023   MINIMALLY INVASIVE MAZE PROCEDURE     done in [place]  Dr R. Sullivan Endow   stye Right 09/2022    SOCIAL HISTORY: Social History   Socioeconomic History   Marital status: Married    Spouse name: Not on file   Number of children: Not on file   Years of education: Not on file   Highest education level: Bachelor's degree (e.g., BA, AB, BS)  Occupational History   Not on file  Tobacco Use   Smoking status: Never   Smokeless tobacco: Never  Vaping Use   Vaping status: Never Used  Substance and Sexual Activity   Alcohol use: Never   Drug use: Not Currently   Sexual activity: Not Currently  Other Topics Concern   Not on file  Social History Narrative   2 sons and 1 daughter    Married    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    No guns, wears seat belt, safe in relationship   Vegan x 8-9 years as of 07/06/20    Social Drivers of Corporate investment banker Strain: Low Risk  (04/16/2023)   Overall Financial Resource Strain (CARDIA)    Difficulty of Paying Living Expenses: Not hard at all  Food Insecurity: No Food Insecurity (04/16/2023)   Hunger Vital Sign    Worried About Running Out of Food in the Last Year: Never true    Ran Out of Food in the Last Year: Never true  Transportation Needs: No Transportation Needs (04/16/2023)   PRAPARE - Administrator, Civil Service (Medical): No    Lack of Transportation (Non-Medical): No  Physical Activity: Unknown (04/16/2023)   Exercise Vital Sign    Days of Exercise per Week: 0  days    Minutes of Exercise per Session: Not on file  Stress: No Stress Concern Present (04/16/2023)   [name]    Feeling of Stress : Not at all  Social Connections: Moderately Integrated (04/16/2023)   Social Connection and Isolation Panel [NHANES]    Frequency of Communication with Friends and Family: Twice a week    Frequency of Social Gatherings with Friends and Family: Twice a week    Attends Religious Services: 1 to 4 times per year    Active Member of [employer] or Organizations: No    Attends Banker Meetings: Never    Marital Status: Married  Catering manager Violence: Not At Risk (04/16/2023)   Humiliation, Afraid, Rape, and Kick questionnaire    Fear of Current or Ex-Partner: No    Emotionally Abused: No    Physically Abused: No    Sexually Abused: No    FAMILY HISTORY: Family History  Problem Relation Age of Onset   COPD Mother    Atrial fibrillation Mother        ?  due to Afib died 59   Cancer Father        lung cancer smoker age 36    Heart disease Father        bypass at 54    Atrial fibrillation Sister     ALLERGIES:  has no known allergies.  MEDICATIONS:  Current Outpatient Medications  Medication Sig Dispense Refill   acetaminophen  (TYLENOL ) 325 MG tablet Take 650 mg by mouth once a week. Take 1 hour prior to infusion appointment.     acyclovir  (ZOVIRAX ) 400 MG tablet Take 1 tablet (400 mg total) by mouth 2 (two) times daily. 180 tablet 1   ASPIRIN 81 PO Take 81 mg by mouth daily.     cyanocobalamin  1000 MCG tablet Take 2,000 mcg by mouth.     dexamethasone  (DECADRON ) 4 MG tablet Take 5 tabs (20mg ) by mouth 1 hour prior to weekly infusion appointment and take 5 tabs (20mg ) the day after weekly infusion appointment. Take with with food . 120 tablet 0   diphenhydrAMINE  (BENADRYL ) 50 MG capsule Take 50 mg by mouth once a week. Take 1 hour prior to infusion appointment.     hydrOXYzine   (ATARAX ) 10 MG tablet Take 1 tablet (10 mg total) by mouth 3 (three) times daily as needed. 30 tablet 0   lenalidomide  (REVLIMID ) 25 MG capsule Take 1 capsule (25 mg total) by mouth daily. Take for 14 days, then hold for 7 days. Repeat every 21 days. 14 capsule 0   montelukast  (SINGULAIR ) 10 MG tablet Take 1 tablet (10 mg total) by mouth once a week. Take 1 hour prior to infusion appointment. 2 tablet 0   Multiple Vitamin (CALCIUM COMPLEX PO) Take by mouth daily.     Multiple Vitamins-Minerals (MULTIVITAMIN WOMEN PO) Take by mouth daily.     Omega-3 1400 MG CAPS      ondansetron  (ZOFRAN ) 8 MG tablet One pill every 8 hours as needed for nausea/vomitting. 40 tablet 1   predniSONE (DELTASONE) 20 MG tablet Take 3 pills once a day -for 3 days; then 2 pills once a day for 3 days and then 1 pill -do not stop until further directions. Take once a day with food- in AM. 40 tablet 0   prochlorperazine  (COMPAZINE ) 10 MG tablet Take 1 tablet (10 mg total) by mouth every 6 (six) hours as needed for nausea or vomiting. 40 tablet 1   Turmeric (CURCUMIN 95) 500 MG CAPS Take 500 mg by mouth 2 (two) times daily.     Vitamin D -Vitamin K (VITAMIN K2-VITAMIN D3 PO) Take by mouth daily.     No current facility-administered medications for this visit.   PHYSICAL EXAMINATION:   Vitals:   08/03/23 0849  BP: 92/72  Pulse: (!) 113  Resp: 20  Temp: (!) 100.9 F (38.3 C)  SpO2: 99%    Filed Weights   08/03/23 0849  Weight: 226 lb 6.4 oz (102.7 kg)    Positive for bilateral gynecomastia.   Facial/torso rash- see below  Physical Exam Vitals and nursing note reviewed.  HENT:     Head: Normocephalic and atraumatic.     Mouth/Throat:     Pharynx: Oropharynx is clear.  Eyes:     Extraocular Movements: Extraocular movements intact.     Pupils: Pupils are equal, round, and reactive to light.  Cardiovascular:     Rate and Rhythm: Normal rate and regular rhythm.  Abdominal:     Palpations: Abdomen is soft.   Musculoskeletal:  General: Normal range of motion.     Cervical back: Normal range of motion.  Skin:    General: Skin is warm.  Neurological:     General: No focal deficit present.     Mental Status: He is alert and oriented to person, place, and time.  Psychiatric:        Behavior: Behavior normal.        Judgment: Judgment normal.        LABORATORY DATA:  I have reviewed the data as listed Lab Results  Component Value Date   WBC 6.6 08/03/2023   HGB 13.4 08/03/2023   HCT 40.2 08/03/2023   MCV 91.4 08/03/2023   PLT 129 (L) 08/03/2023   Recent Labs    07/03/23 0914 07/27/23 0826 08/03/23 0847  NA 138 135 133*  K 3.7 4.0 4.3  CL 103 101 100  CO2 27 26 26   GLUCOSE 119* 96 146*  BUN 10 10 9   CREATININE 0.81 0.73 0.92  CALCIUM 9.3 9.2 7.9*  GFRNONAA >60 >60 >60  PROT 6.3* 6.3* 5.4*  ALBUMIN 3.8 3.7 3.2*  AST 20 19 19   ALT 24 21 32  ALKPHOS 78 76 64  BILITOT 0.7 0.6 1.0   No results found.   Lab Results  Component Value Date   KPAFRELGTCHN 490.9 (H) 07/03/2023   KPAFRELGTCHN 439.1 (H) 04/16/2023   LAMBDASER 4.4 (L) 07/03/2023   LAMBDASER 4.8 (L) 04/16/2023   KAPLAMBRATIO 111.57 (H) 07/03/2023   KAPLAMBRATIO 91.48 (H) 04/16/2023     Multiple myeloma in relapse (HCC) # LIGHT CHAIN MONOCLONAL GAMMOPATHY -DEC 2024-[PCP-SPEP-NEGATIVE; Hb 13; GFR-> 60 ca-WN; However, random UPEP- positive for M protein/Bence-Jones- Protein positive; kappa type.  JAN 2025- SPEP- NEG; K/l= 91 [K=400]. Dx= includes smoldering MM vs active MM. FEB 2025- Bone marrow- BONE MARROW, ASPIRATE, CLOT, CORE:  -  Kappa restricted plasma cell neoplasm involving 50 to 90% of the  cellular marrow (patchy areas; average 70%)   cytogenetics: 11:14; 13 q- [standard risk] Negative Congo stain. MARCH 2025-PET Numerous lytic myelomatous bone lesions throughout the axial and appendicular skeleton noted on the CT scan without associated discrete significant hypermetabolism.  There are 2 slightly  hypermetabolic foci in noted in the right femur which appear to correlate with CT abnormalities. Low level hypermetabolism with SUV max of 2.26 and 2.20. A few other scattered bone lesions have similar FDG uptake. No hypermetabolic soft tissue masses or adenopathy. S/P evaluation with Dr.Choi [facility]- bone marrow evaluation]. Discussed with Dr.Choi. TRANSPLANT ELIGIBLE. 4/25- Currently on Dara-RVD [q 21 d cycle- Griffin trial-   # GIVEN RASH/ HOLD cycle #1 day- 8 Dara-RVD [q 21 d cycle- Griffin trial- # Hold Revlimid  until further directions.  # Itchy scalp - 3-4 days later- progressed yesterday- rash face /and swelling around the eyes.Mild rash on the upper torso/ in the anterior. S/p 2 doses of hydroxyzine . Also s/p claritin.  Start patient on prednisone taper-start tomorrow since patient took dexamethasone  today.  # Multiple bone lesion-- [per pt s/p dental clearance] -Continue ca+Vit D BID. [Vit D 1000/d; 5000 q OD]. Monitor for now-follow-up Zometa given the allergic reaction to above meds.  # History of a flutter [s/p ablation in [place] - clip of Left atrial appendage]-no anticoagulation- on asprin; regular rhythm- stable.   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   # DVT-ID Prophylaxis: asprin/acyclovir   # ACP:   # # IV access:PIV  # 1-4 cycles-SQ Dara-velcade  weekly x4 cycles; 5-6 dara-q2 w; Velade-weekly; Zometa; Starting 5/9-in  3w- q 2 w appt- MD-re-adjust  Pre-meds-? PS # DISPOSITION: # HOLD chemo today # 1 week- MD; labs- cbc/cmp; Chemo # 2 weeks- X- MD; labs- cbc/cmp; Chemo; MM panel; K/L light chains- Zometa-   # 3 weeks- X- MD or APP; labs- cbc/cmp; Chemo;  Dr.B      All questions were answered. The patient knows to call the clinic with any problems, questions or concerns.    Gwyn Leos, MD 08/03/2023 10:57 AM

## 2023-08-03 NOTE — Patient Instructions (Signed)
#   Hold Revlimid  until further directions.

## 2023-08-03 NOTE — Progress Notes (Signed)
 Pt has significant facial/eye swelling for the past 4 days, itchy/redness. Rash on face, scalp, upper torso. He did take his premeds for today.  Pt states he is currently in afib, temp 100.9.

## 2023-08-03 NOTE — Assessment & Plan Note (Addendum)
#   LIGHT CHAIN MONOCLONAL GAMMOPATHY -DEC 2024-[PCP-SPEP-NEGATIVE; Hb 13; GFR-> 60 ca-WN; However, random UPEP- positive for M protein/Bence-Jones- Protein positive; kappa type.  JAN 2025- SPEP- NEG; K/l= 91 [K=400]. Dx= includes smoldering MM vs active MM. FEB 2025- Bone marrow- BONE MARROW, ASPIRATE, CLOT, CORE:  -  Kappa restricted plasma cell neoplasm involving 50 to 90% of the  cellular marrow (patchy areas; average 70%)   cytogenetics: 11:14; 13 q- [standard risk] Negative Congo stain. MARCH 2025-PET Numerous lytic myelomatous bone lesions throughout the axial and appendicular skeleton noted on the CT scan without associated discrete significant hypermetabolism.  There are 2 slightly hypermetabolic foci in noted in the right femur which appear to correlate with CT abnormalities. Low level hypermetabolism with SUV max of 2.26 and 2.20. A few other scattered bone lesions have similar FDG uptake. No hypermetabolic soft tissue masses or adenopathy. S/P evaluation with Dr.Choi [facility]- bone marrow evaluation]. Discussed with Dr.Choi. TRANSPLANT ELIGIBLE. 4/25- Currently on Dara-RVD [q 21 d cycle- Griffin trial-   # GIVEN RASH/ HOLD cycle #1 day- 8 Dara-RVD [q 21 d cycle- Griffin trial- # Hold Revlimid  until further directions.  # Itchy scalp - 3-4 days later- progressed yesterday- rash face /and swelling around the eyes.Mild rash on the upper torso/ in the anterior. S/p 2 doses of hydroxyzine . Also s/p claritin.  Start patient on prednisone taper-start tomorrow since patient took dexamethasone  today.  # Multiple bone lesion-- [per pt s/p dental clearance] -Continue ca+Vit D BID. [Vit D 1000/d; 5000 q OD]. Monitor for now-follow-up Zometa given the allergic reaction to above meds.  # History of a flutter [s/p ablation in [place] - clip of Left atrial appendage]-no anticoagulation- on asprin; regular rhythm- stable.   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   # DVT-ID Prophylaxis:  asprin/acyclovir   # ACP:   # # IV access:PIV  # 1-4 cycles-SQ Dara-velcade  weekly x4 cycles; 5-6 dara-q2 w; Velade-weekly; Zometa; Starting 5/9-in 3w- q 2 w appt- MD-re-adjust  Pre-meds-? PS # DISPOSITION: # HOLD chemo today # 1 week- MD; labs- cbc/cmp; Chemo # 2 weeks- X- MD; labs- cbc/cmp; Chemo; MM panel; K/L light chains- Zometa-   # 3 weeks- X- MD or APP; labs- cbc/cmp; Chemo;  Dr.B

## 2023-08-07 ENCOUNTER — Encounter: Payer: Self-pay | Admitting: Internal Medicine

## 2023-08-07 ENCOUNTER — Other Ambulatory Visit: Payer: Self-pay | Admitting: *Deleted

## 2023-08-07 DIAGNOSIS — C9000 Multiple myeloma not having achieved remission: Secondary | ICD-10-CM

## 2023-08-07 MED ORDER — LENALIDOMIDE 25 MG PO CAPS
25.0000 mg | ORAL_CAPSULE | Freq: Every day | ORAL | 0 refills | Status: DC
Start: 2023-08-07 — End: 2023-08-10

## 2023-08-09 ENCOUNTER — Encounter: Payer: Self-pay | Admitting: Internal Medicine

## 2023-08-10 ENCOUNTER — Other Ambulatory Visit

## 2023-08-10 ENCOUNTER — Telehealth: Payer: Self-pay | Admitting: Pharmacist

## 2023-08-10 ENCOUNTER — Encounter: Payer: Self-pay | Admitting: Internal Medicine

## 2023-08-10 ENCOUNTER — Inpatient Hospital Stay (HOSPITAL_BASED_OUTPATIENT_CLINIC_OR_DEPARTMENT_OTHER): Admitting: Internal Medicine

## 2023-08-10 ENCOUNTER — Ambulatory Visit

## 2023-08-10 ENCOUNTER — Ambulatory Visit: Admitting: Internal Medicine

## 2023-08-10 ENCOUNTER — Inpatient Hospital Stay

## 2023-08-10 VITALS — BP 101/67 | HR 90 | Temp 96.9°F | Resp 19 | Ht 71.0 in | Wt 230.1 lb

## 2023-08-10 DIAGNOSIS — C9002 Multiple myeloma in relapse: Secondary | ICD-10-CM | POA: Diagnosis not present

## 2023-08-10 DIAGNOSIS — Z5112 Encounter for antineoplastic immunotherapy: Secondary | ICD-10-CM | POA: Diagnosis not present

## 2023-08-10 DIAGNOSIS — C9000 Multiple myeloma not having achieved remission: Secondary | ICD-10-CM

## 2023-08-10 LAB — CBC WITH DIFFERENTIAL (CANCER CENTER ONLY)
Abs Immature Granulocytes: 0.06 10*3/uL (ref 0.00–0.07)
Basophils Absolute: 0 10*3/uL (ref 0.0–0.1)
Basophils Relative: 0 %
Eosinophils Absolute: 0 10*3/uL (ref 0.0–0.5)
Eosinophils Relative: 0 %
HCT: 40.3 % (ref 39.0–52.0)
Hemoglobin: 13.5 g/dL (ref 13.0–17.0)
Immature Granulocytes: 1 %
Lymphocytes Relative: 13 %
Lymphs Abs: 1.1 10*3/uL (ref 0.7–4.0)
MCH: 30.8 pg (ref 26.0–34.0)
MCHC: 33.5 g/dL (ref 30.0–36.0)
MCV: 92 fL (ref 80.0–100.0)
Monocytes Absolute: 1 10*3/uL (ref 0.1–1.0)
Monocytes Relative: 12 %
Neutro Abs: 6.1 10*3/uL (ref 1.7–7.7)
Neutrophils Relative %: 74 %
Platelet Count: 204 10*3/uL (ref 150–400)
RBC: 4.38 MIL/uL (ref 4.22–5.81)
RDW: 15.3 % (ref 11.5–15.5)
WBC Count: 8.4 10*3/uL (ref 4.0–10.5)
nRBC: 0 % (ref 0.0–0.2)

## 2023-08-10 LAB — CMP (CANCER CENTER ONLY)
ALT: 50 U/L — ABNORMAL HIGH (ref 0–44)
AST: 18 U/L (ref 15–41)
Albumin: 3.2 g/dL — ABNORMAL LOW (ref 3.5–5.0)
Alkaline Phosphatase: 80 U/L (ref 38–126)
Anion gap: 7 (ref 5–15)
BUN: 20 mg/dL (ref 8–23)
CO2: 28 mmol/L (ref 22–32)
Calcium: 8.2 mg/dL — ABNORMAL LOW (ref 8.9–10.3)
Chloride: 101 mmol/L (ref 98–111)
Creatinine: 0.76 mg/dL (ref 0.61–1.24)
GFR, Estimated: >60 mL/min (ref >60)
Glucose, Bld: 101 mg/dL — ABNORMAL HIGH (ref 70–99)
Potassium: 4 mmol/L (ref 3.5–5.1)
Sodium: 136 mmol/L (ref 135–145)
Total Bilirubin: 1 mg/dL (ref 0.0–1.2)
Total Protein: 5.2 g/dL — ABNORMAL LOW (ref 6.5–8.1)

## 2023-08-10 MED ORDER — BORTEZOMIB CHEMO SQ INJECTION 3.5 MG (2.5MG/ML)
1.3000 mg/m2 | Freq: Once | INTRAMUSCULAR | Status: AC
  Administered 2023-08-10: 3 mg via SUBCUTANEOUS
  Filled 2023-08-10: qty 1.2

## 2023-08-10 MED ORDER — ACETAMINOPHEN 325 MG PO TABS
650.0000 mg | ORAL_TABLET | Freq: Once | ORAL | Status: AC
  Administered 2023-08-10: 650 mg via ORAL
  Filled 2023-08-10: qty 2

## 2023-08-10 MED ORDER — MONTELUKAST SODIUM 10 MG PO TABS: 10.0000 mg | ORAL_TABLET | ORAL | 0 refills | Status: DC

## 2023-08-10 MED ORDER — MONTELUKAST SODIUM 10 MG PO TABS
10.0000 mg | ORAL_TABLET | Freq: Once | ORAL | Status: AC
Start: 2023-08-10 — End: 2023-08-10
  Administered 2023-08-10: 10 mg via ORAL
  Filled 2023-08-10: qty 1

## 2023-08-10 MED ORDER — LENALIDOMIDE 25 MG PO CAPS
25.0000 mg | ORAL_CAPSULE | Freq: Every day | ORAL | 0 refills | Status: DC
Start: 2023-08-10 — End: 2023-09-07

## 2023-08-10 MED ORDER — DEXAMETHASONE 4 MG PO TABS
20.0000 mg | ORAL_TABLET | Freq: Once | ORAL | Status: AC
  Administered 2023-08-10: 20 mg via ORAL
  Filled 2023-08-10: qty 5

## 2023-08-10 MED ORDER — DIPHENHYDRAMINE HCL 25 MG PO CAPS
50.0000 mg | ORAL_CAPSULE | Freq: Once | ORAL | Status: AC
  Administered 2023-08-10: 50 mg via ORAL
  Filled 2023-08-10: qty 2

## 2023-08-10 NOTE — Telephone Encounter (Signed)
 Received following message from Dr. Valentine Gasmen on 08/10/23 "Alysson-I have interrupted the Revlimid  given the reaction. Patient had been on Revlimid  only for 1 week. Will plan to restart Revlimid -in about 2 weeks/cycle number 2-day 1.- 14 days. FYI- "   Rx for the 7 capsule Courtenay Distel would need to complete upcoming cycle 2 were sent to [facility]. Of note on 08/07/23, MD team sent rx for 14 capsules. Note to pharmacy added to new rx sent to disregard the rx for quantity 14.

## 2023-08-10 NOTE — Assessment & Plan Note (Addendum)
#   LIGHT CHAIN MONOCLONAL GAMMOPATHY -DEC 2024-[PCP-SPEP-NEGATIVE; Hb 13; GFR-> 60 ca-WN; However, random UPEP- positive for M protein/Bence-Jones- Protein positive; kappa type.  JAN 2025- SPEP- NEG; K/l= 91 [K=400]. Dx= includes smoldering MM vs active MM. FEB 2025- Bone marrow- BONE MARROW, ASPIRATE, CLOT, CORE:  -  Kappa restricted plasma cell neoplasm involving 50 to 90% of the  cellular marrow (patchy areas; average 70%)   cytogenetics: 11:14; 13 q- [standard risk] Negative Congo stain. MARCH 2025-PET Numerous lytic myelomatous bone lesions throughout the axial and appendicular skeleton noted on the CT scan without associated discrete significant hypermetabolism.  There are 2 slightly hypermetabolic foci in noted in the right femur which appear to correlate with CT abnormalities. Low level hypermetabolism with SUV max of 2.26 and 2.20. A few other scattered bone lesions have similar FDG uptake. No hypermetabolic soft tissue masses or adenopathy. S/P evaluation with Dr.Choi [facility]- bone marrow evaluation]. Discussed with Dr.Choi. TRANSPLANT ELIGIBLE. 4/25- Currently on Dara-RVD [q 21 d cycle- Griffin trial-   # GIVEN RASH/ HELD cycle #1 day- 8 Dara-RVD [q 21 d cycle- Griffin trial- # CONTINUE to Hold Revlimid  until further directions.will consider starting with cycle #2- day-1.   # PROCEED WITH Today cycle #1 day- 8 - I will hold off Dara today.  Just proceed with Velcade -will order dexamethasone  20 mg today; please also add Tylenol  650 Benadryl  50; and montelukast  today.  Starting next week-patient will take the premeds at home. Plan Dara-velade next week. Will start revlimid  1-14 with cycle #2 day-1 [in 2 weeks]. Discussed with pharmacy.   # Itchy scalp  rash face /and swelling around the eyes- improved on prednisone - currently on prednisone  20 mg/day- discontinue starting today.   # Bilateral tremors- sec to prednisone - currently on 20 mg/day- see above.   # Multiple bone lesion-- [per pt s/p dental  clearance] -Continue ca+Vit D BID. [Vit D 1000/d; 5000 q OD]. Monitor for now-follow-up Zometa given the allergic reaction to above meds-  stable.   # History of a flutter [s/p ablation in [place] - clip of Left atrial appendage]-no anticoagulation- on asprin; irregular rhythm-  stable.   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   # DVT-ID Prophylaxis: asprin/acyclovir   # ACP:   # # IV access:PIV  # 1-4 cycles-SQ Dara-velcade  weekly x4 cycles; 5-6 dara-q2 w; Velade-weekly; Zometa; Starting 5/9-in 3w- q 2 w appt- MD-re-adjust  Pre-meds-? PS # DISPOSITION: # chemo chemo today- ONLY velcade - NO dara # 1 weeks- X- MD; labs- cbc/cmp; Chemo; MM panel; K/L light chains- possible Zometa-   # 2  weeks- r APP; labs- cbc/cmp; Chemo;   # 3 weeks- MD or APP; labs- cbc/cmp; Chemo;  Dr.B

## 2023-08-10 NOTE — Progress Notes (Signed)
 Patient states he has been having some shaking in both arms. Patient states he believes this may be a side effect of the prednisone .

## 2023-08-10 NOTE — Progress Notes (Signed)
 [facility] Clinical Social Work  Visual merchandiser met with patient to offer support and assess for needs.     Met with patient and his wife, Jimmy [name], in infusion.  He stated he feels better today than he did last week when he had a reaction.  Discussed advance directives, which he already has and are in his chart.  He acknowledged that he had the free massage certificates.  He expressed no other needs.     Kennth Peal, LCSW  Clinical Social Worker [facility]

## 2023-08-10 NOTE — Patient Instructions (Signed)
 [facility] - A DEPT OF [facility]  Discharge Instructions: Thank you for choosing [facility] to provide your oncology and hematology care.  If you have a lab appointment with the Cancer Center, please go directly to the Cancer Center and check in at the registration area.  Wear comfortable clothing and clothing appropriate for easy access to any Portacath or PICC line.   We strive to give you quality time with your provider. You may need to reschedule your appointment if you arrive late (15 or more minutes).  Arriving late affects you and other patients whose appointments are after yours.  Also, if you miss three or more appointments without notifying the office, you may be dismissed from the clinic at the provider's discretion.      For prescription refill requests, have your pharmacy contact our office and allow 72 hours for refills to be completed.    Today you received the following chemotherapy and/or immunotherapy agents- velcade      To help prevent nausea and vomiting after your treatment, we encourage you to take your nausea medication as directed.  BELOW ARE SYMPTOMS THAT SHOULD BE REPORTED IMMEDIATELY: *FEVER GREATER THAN 100.4 F (38 C) OR HIGHER *CHILLS OR SWEATING *NAUSEA AND VOMITING THAT IS NOT CONTROLLED WITH YOUR NAUSEA MEDICATION *UNUSUAL SHORTNESS OF BREATH *UNUSUAL BRUISING OR BLEEDING *URINARY PROBLEMS (pain or burning when urinating, or frequent urination) *BOWEL PROBLEMS (unusual diarrhea, constipation, pain near the anus) TENDERNESS IN MOUTH AND THROAT WITH OR WITHOUT PRESENCE OF ULCERS (sore throat, sores in mouth, or a toothache) UNUSUAL RASH, SWELLING OR PAIN  UNUSUAL VAGINAL DISCHARGE OR ITCHING   Items with * indicate a potential emergency and should be followed up as soon as possible or go to the Emergency Department if any problems should occur.  Please show the CHEMOTHERAPY ALERT CARD or IMMUNOTHERAPY  ALERT CARD at check-in to the Emergency Department and triage nurse.  Should you have questions after your visit or need to cancel or reschedule your appointment, please contact [facility] - A DEPT OF [facility]  [phone] and follow the prompts.  Office hours are 8:00 a.m. to 4:30 p.m. Monday - Friday. Please note that voicemails left after 4:00 p.m. may not be returned until the following business day.  We are closed weekends and major holidays. You have access to a nurse at all times for urgent questions. Please call the main number to the clinic [phone] and follow the prompts.  For any non-urgent questions, you may also contact your provider using MyChart. We now offer e-Visits for anyone 47 and older to request care online for non-urgent symptoms. For details visit [email or web address].   Also download the MyChart app! Go to the app store, search "MyChart", open the app, select [place], and log in with your MyChart username and password.

## 2023-08-10 NOTE — Progress Notes (Signed)
 [facility] CONSULT NOTE  Patient Care Team: Tye Gall, MD as PCP - General (Internal Medicine) Gwyn Leos, MD as Consulting Physician (Oncology)  CHIEF COMPLAINTS/PURPOSE OF CONSULTATION: Multiple Myeloma   Oncology History Overview Note  # LIGHT CHAIN MONOCLONAL GAMMOPATHY -DEC 2024-[PCP-SPEP-NEGATIVE; Hb 13; GFR-> 60 ca-WN; However, random UPEP- positive for M protein/Bence-Jones- Protein positive; kappa type.  JAN 2025- SPEP- NEG; K/l= 91 [K=400]. Dx= includes smoldering MM vs active MM. 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)   06/05/2023 Cancer Staging   Staging form: Plasma Cell Myeloma and Plasma Cell Disorders, AJCC 8th Edition - Clinical: Albumin (g/dL): 4, ISS: Stage II, High-risk cytogenetics: Absent, LDH: Normal - Signed by Gwyn Leos, MD on 06/05/2023 Albumin range (g/dL): Greater than or equal to 3.5 Cytogenetics: t(11;14) translocation   06/06/2023 Cancer Staging   Staging form: Plasma Cell Myeloma and Plasma Cell Disorders, AJCC 8th Edition - Clinical: RISS Stage I (Beta-2 -microglobulin (mg/L): 2.5, Albumin (g/dL): 4, ISS: Stage I, High-risk cytogenetics: Absent, LDH: Normal) - Signed by Gwyn Leos, MD on 06/06/2023 Stage prefix: Initial diagnosis Beta 2 microglobulin range (mg/L): Less than 3.5 Albumin  range (g/dL): Greater than or equal to 3.5 Cytogenetics: t(11;14) translocation   07/27/2023 -  Chemotherapy   Patient is on Treatment Plan : MYELOMA NEWLY DIAGNOSED TRANSPLANT CANDIDATE DaraVRd (Daratumumab  SQ) with weekly bortezomib  (D1,8,15) q21d x 6 Cycles (Induction/Consolidation)      HISTORY OF PRESENTING ILLNESS: Patient ambulating-independently. Accompanied by wife.   Jimmy [name] 68 y.o.  male with Multiple Myeloma [dx: march 2025] with multiple bone lesions-is here to proceed with with DARA-RVD- chemotherapy is here for a follow-up.   GIVEN RASH/ HELD cycle #1 day- 8 Dara-RVD- appx 1 week ago.  Started on prednisone .  Noted to have numbness bilateral upper extremities.  Patient noted to have significant improvement of the rash/itch on the prednisone .  Patient continues to complain of on going fatigue.  Otherwise denies any tingling and numbness in the extremities.  No falls.   Review of Systems  Constitutional:  Positive for malaise/fatigue. Negative for chills, diaphoresis, fever and weight loss.  HENT:  Negative for nosebleeds and sore throat.   Eyes:  Negative for double vision.  Respiratory:  Negative for cough, hemoptysis, sputum production, shortness of breath and wheezing.   Cardiovascular:  Negative for chest pain, palpitations, orthopnea and leg swelling.  Gastrointestinal:  Negative for abdominal pain, blood in stool, constipation, diarrhea, heartburn, melena, nausea and vomiting.  Genitourinary:  Negative for dysuria, frequency and urgency.  Musculoskeletal:  Negative for back pain and joint pain.  Skin: Negative.  Negative for itching and rash.  Neurological:  Negative for dizziness, tingling, focal weakness, weakness and headaches.  Endo/Heme/Allergies:  Does not bruise/bleed easily.  Psychiatric/Behavioral:  Negative for depression. The patient is not nervous/anxious and does not have insomnia.     MEDICAL HISTORY:  Past Medical  History:  Diagnosis Date    Atrial fibrillation (HCC)    had loop recorder, PAcs after DCCV 09/2019   Atrial flutter (HCC)    Fatigue    HLD (hyperlipidemia)    On amiodarone therapy 09/15/2019   Pain of right lower leg 01/02/2022    SURGICAL HISTORY: Past Surgical History:  Procedure Laterality Date   afib surgical ablation  2021   CARDIOVERSION     2021 for Afib in [place]   CHOLECYSTECTOMY     GALLBLADDER SURGERY  2006   IR BONE MARROW BIOPSY & ASPIRATION  05/18/2023   MINIMALLY INVASIVE MAZE PROCEDURE     done in [place]  Dr R. Sullivan Endow   stye Right 09/2022    SOCIAL HISTORY: Social History   Socioeconomic History   Marital status: Married    Spouse name: Not on file   Number of children: Not on file   Years of education: Not on file   Highest education level: Bachelor's degree (e.g., BA, AB, BS)  Occupational History   Not on file  Tobacco Use   Smoking status: Never   Smokeless tobacco: Never  Vaping Use   Vaping status: Never Used  Substance and Sexual Activity   Alcohol use: Never   Drug use: Not Currently   Sexual activity: Not Currently  Other Topics Concern   Not on file  Social History Narrative   2 sons and 1 daughter    Married    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    No guns, wears seat belt, safe in relationship   Vegan x 8-9 years as of 07/06/20    Social Drivers of Corporate investment banker Strain: Low Risk  (04/16/2023)   Overall Financial Resource Strain (CARDIA)    Difficulty of Paying Living Expenses: Not hard at all  Food Insecurity: No Food Insecurity (04/16/2023)   Hunger Vital Sign    Worried About Running Out of Food in the Last Year: Never true    Ran Out of Food in the Last Year: Never true  Transportation Needs: No Transportation Needs (04/16/2023)   PRAPARE - Administrator, Civil Service (Medical): No    Lack of Transportation (Non-Medical): No  Physical Activity: Unknown (04/16/2023)   Exercise Vital Sign    Days of Exercise per Week: 0  days    Minutes of Exercise per Session: Not on file  Stress: No Stress Concern Present (04/16/2023)   [name]    Feeling of Stress : Not at all  Social Connections: Moderately Integrated (04/16/2023)   Social Connection and Isolation Panel [NHANES]    Frequency of Communication with Friends and Family: Twice a week    Frequency of Social Gatherings with Friends and Family: Twice a week    Attends Religious Services: 1 to 4 times per year    Active Member of [employer] or Organizations: No    Attends Banker Meetings: Never    Marital Status: Married  Catering manager Violence: Not At Risk (04/16/2023)   Humiliation, Afraid, Rape, and Kick questionnaire    Fear of Current or Ex-Partner: No    Emotionally Abused: No    Physically Abused: No    Sexually Abused: No    FAMILY HISTORY: Family History  Problem Relation Age of Onset   COPD Mother    Atrial fibrillation Mother        ?  due to Afib died 83   Cancer Father        lung cancer smoker age 21    Heart disease Father        bypass at 14    Atrial fibrillation Sister     ALLERGIES:  has no known allergies.  MEDICATIONS:  Current Outpatient Medications  Medication Sig Dispense Refill   acetaminophen  (TYLENOL ) 325 MG tablet Take 650 mg by mouth once a week. Take 1 hour prior to infusion appointment.     acyclovir  (ZOVIRAX ) 400 MG tablet Take 1 tablet (400 mg total) by mouth 2 (two) times daily. 180 tablet 1   ASPIRIN 81 PO Take 81 mg by mouth daily.     cyanocobalamin  1000 MCG tablet Take 2,000 mcg by mouth.     dexamethasone  (DECADRON ) 4 MG tablet Take 5 tabs (20mg ) by mouth 1 hour prior to weekly infusion appointment and take 5 tabs (20mg ) the day after weekly infusion appointment. Take with with food . 120 tablet 0   diphenhydrAMINE  (BENADRYL ) 50 MG capsule Take 50 mg by mouth once a week. Take 1 hour prior to infusion appointment.     hydrOXYzine   (ATARAX ) 10 MG tablet Take 1 tablet (10 mg total) by mouth 3 (three) times daily as needed. 30 tablet 0   Multiple Vitamin (CALCIUM COMPLEX PO) Take by mouth daily.     Multiple Vitamins-Minerals (MULTIVITAMIN WOMEN PO) Take by mouth daily.     Omega-3 1400 MG CAPS      ondansetron  (ZOFRAN ) 8 MG tablet One pill every 8 hours as needed for nausea/vomitting. 40 tablet 1   predniSONE  (DELTASONE ) 20 MG tablet Take 3 pills once a day -for 3 days; then 2 pills once a day for 3 days and then 1 pill -do not stop until further directions. Take once a day with food- in AM. 40 tablet 0   prochlorperazine  (COMPAZINE ) 10 MG tablet Take 1 tablet (10 mg total) by mouth every 6 (six) hours as needed for nausea or vomiting. 40 tablet 1   Turmeric (CURCUMIN 95) 500 MG CAPS Take 500 mg by mouth 2 (two) times daily.     Vitamin D -Vitamin K (VITAMIN K2-VITAMIN D3 PO) Take by mouth daily.     lenalidomide  (REVLIMID ) 25 MG capsule Take 1 capsule (25 mg total) by mouth daily. Take for 14 days, then hold for 7 days. Repeat every 21 days. 7 capsule 0   montelukast  (SINGULAIR ) 10 MG tablet Take 1 tablet (10 mg total) by mouth once a week. Take 1 hour prior to infusion appointment. 40 tablet 0   No current facility-administered medications for this visit.   Facility-Administered Medications Ordered in Other Visits  Medication Dose Route Frequency Provider Last Rate Last Admin   bortezomib  SQ (VELCADE ) chemo injection (2.5mg /mL concentration) 3 mg  1.3 mg/m2 (Treatment Plan Recorded) Subcutaneous Once Lariza Cothron R, MD       PHYSICAL EXAMINATION:   Vitals:   08/10/23 0824  BP: 101/67  Pulse: 90  Resp: 19  Temp: (!) 96.9 F (36.1 C)  SpO2: 99%     Filed Weights   08/10/23 0824  Weight: 230 lb 1.6 oz (104.4 kg)     Positive for bilateral gynecomastia.   Facial/torso rash- see below  Physical Exam Vitals and nursing note reviewed.  HENT:     Head: Normocephalic and atraumatic.     Mouth/Throat:      Pharynx: Oropharynx is clear.  Eyes:  Extraocular Movements: Extraocular movements intact.     Pupils: Pupils are equal, round, and reactive to light.  Cardiovascular:     Rate and Rhythm: Normal rate and regular rhythm.  Abdominal:     Palpations: Abdomen is soft.  Musculoskeletal:        General: Normal range of motion.     Cervical back: Normal range of motion.  Skin:    General: Skin is warm.  Neurological:     General: No focal deficit present.     Mental Status: He is alert and oriented to person, place, and time.  Psychiatric:        Behavior: Behavior normal.        Judgment: Judgment normal.        LABORATORY DATA:  I have reviewed the data as listed Lab Results  Component Value Date   WBC 8.4 08/10/2023   HGB 13.5 08/10/2023   HCT 40.3 08/10/2023   MCV 92.0 08/10/2023   PLT 204 08/10/2023   Recent Labs    07/27/23 0826 08/03/23 0847 08/10/23 0832  NA 135 133* 136  K 4.0 4.3 4.0  CL 101 100 101  CO2 26 26 28   GLUCOSE 96 146* 101*  BUN 10 9 20   CREATININE 0.73 0.92 0.76  CALCIUM 9.2 7.9* 8.2*  GFRNONAA >60 >60 >60  PROT 6.3* 5.4* 5.2*  ALBUMIN 3.7 3.2* 3.2*  AST 19 19 18   ALT 21 32 50*  ALKPHOS 76 64 80  BILITOT 0.6 1.0 1.0   No results found.   Lab Results  Component Value Date   KPAFRELGTCHN 490.9 (H) 07/03/2023   KPAFRELGTCHN 439.1 (H) 04/16/2023   LAMBDASER 4.4 (L) 07/03/2023   LAMBDASER 4.8 (L) 04/16/2023   KAPLAMBRATIO 111.57 (H) 07/03/2023   KAPLAMBRATIO 91.48 (H) 04/16/2023     Multiple myeloma in relapse (HCC) # LIGHT CHAIN MONOCLONAL GAMMOPATHY -DEC 2024-[PCP-SPEP-NEGATIVE; Hb 13; GFR-> 60 ca-WN; However, random UPEP- positive for M protein/Bence-Jones- Protein positive; kappa type.  JAN 2025- SPEP- NEG; K/l= 91 [K=400]. Dx= includes smoldering MM vs active MM. FEB 2025- Bone marrow- BONE MARROW, ASPIRATE, CLOT, CORE:  -  Kappa restricted plasma cell neoplasm involving 50 to 90% of the  cellular marrow (patchy areas;  average 70%)   cytogenetics: 11:14; 13 q- [standard risk] Negative Congo stain. MARCH 2025-PET Numerous lytic myelomatous bone lesions throughout the axial and appendicular skeleton noted on the CT scan without associated discrete significant hypermetabolism.  There are 2 slightly hypermetabolic foci in noted in the right femur which appear to correlate with CT abnormalities. Low level hypermetabolism with SUV max of 2.26 and 2.20. A few other scattered bone lesions have similar FDG uptake. No hypermetabolic soft tissue masses or adenopathy. S/P evaluation with Dr.Choi [facility]- bone marrow evaluation]. Discussed with Dr.Choi. TRANSPLANT ELIGIBLE. 4/25- Currently on Dara-RVD [q 21 d cycle- Griffin trial-   # GIVEN RASH/ HELD cycle #1 day- 8 Dara-RVD [q 21 d cycle- Griffin trial- # CONTINUE to Hold Revlimid  until further directions.will consider starting with cycle #2- day-1.   # PROCEED WITH Today cycle #1 day- 8 - I will hold off Dara today.  Just proceed with Velcade -will order dexamethasone  20 mg today; please also add Tylenol  650 Benadryl  50; and montelukast  today.  Starting next week-patient will take the premeds at home. Plan Dara-velade next week. Will start revlimid  1-14 with cycle #2 day-1 [in 2 weeks]. Discussed with pharmacy.   # Itchy scalp  rash face /and swelling around the eyes-  improved on prednisone - currently on prednisone  20 mg/day- discontinue starting today.   # Bilateral tremors- sec to prednisone - currently on 20 mg/day- see above.   # Multiple bone lesion-- [per pt s/p dental clearance] -Continue ca+Vit D BID. [Vit D 1000/d; 5000 q OD]. Monitor for now-follow-up Zometa given the allergic reaction to above meds-  stable.   # History of a flutter [s/p ablation in [place] - clip of Left atrial appendage]-no anticoagulation- on asprin; irregular rhythm-  stable.   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   # DVT-ID Prophylaxis: asprin/acyclovir   # ACP:   #  # IV access:PIV  # 1-4 cycles-SQ Dara-velcade  weekly x4 cycles; 5-6 dara-q2 w; Velade-weekly; Zometa; Starting 5/9-in 3w- q 2 w appt- MD-re-adjust  Pre-meds-? PS # DISPOSITION: # chemo chemo today- ONLY velcade - NO dara # 1 weeks- X- MD; labs- cbc/cmp; Chemo; MM panel; K/L light chains- possible Zometa-   # 2  weeks- r APP; labs- cbc/cmp; Chemo;   # 3 weeks- MD or APP; labs- cbc/cmp; Chemo;  Dr.B   All questions were answered. The patient knows to call the clinic with any problems, questions or concerns.    Gwyn Leos, MD 08/10/2023 10:03 AM

## 2023-08-13 NOTE — Telephone Encounter (Signed)
 Spoke with Courtenay Distel about the short fill that will be coming from [facility]. Spoke with [facility] and they confirm that would reach out the the patient to fill the lenalidomide  25mg  quantity 7 capsules.

## 2023-08-14 ENCOUNTER — Encounter: Payer: Self-pay | Admitting: Internal Medicine

## 2023-08-17 ENCOUNTER — Ambulatory Visit

## 2023-08-17 ENCOUNTER — Inpatient Hospital Stay

## 2023-08-17 ENCOUNTER — Ambulatory Visit: Admitting: Nurse Practitioner

## 2023-08-17 ENCOUNTER — Other Ambulatory Visit

## 2023-08-17 ENCOUNTER — Inpatient Hospital Stay (HOSPITAL_BASED_OUTPATIENT_CLINIC_OR_DEPARTMENT_OTHER): Admitting: Oncology

## 2023-08-17 ENCOUNTER — Encounter: Payer: Self-pay | Admitting: Oncology

## 2023-08-17 VITALS — BP 106/75 | HR 88 | Temp 96.7°F | Resp 18 | Wt 241.7 lb

## 2023-08-17 VITALS — BP 103/75 | HR 84 | Resp 18

## 2023-08-17 DIAGNOSIS — R200 Anesthesia of skin: Secondary | ICD-10-CM

## 2023-08-17 DIAGNOSIS — Z5111 Encounter for antineoplastic chemotherapy: Secondary | ICD-10-CM | POA: Diagnosis not present

## 2023-08-17 DIAGNOSIS — C9002 Multiple myeloma in relapse: Secondary | ICD-10-CM

## 2023-08-17 DIAGNOSIS — R21 Rash and other nonspecific skin eruption: Secondary | ICD-10-CM

## 2023-08-17 DIAGNOSIS — R202 Paresthesia of skin: Secondary | ICD-10-CM

## 2023-08-17 DIAGNOSIS — Z5112 Encounter for antineoplastic immunotherapy: Secondary | ICD-10-CM | POA: Diagnosis not present

## 2023-08-17 LAB — CBC WITH DIFFERENTIAL (CANCER CENTER ONLY)
Abs Immature Granulocytes: 0.01 10*3/uL (ref 0.00–0.07)
Basophils Absolute: 0 10*3/uL (ref 0.0–0.1)
Basophils Relative: 1 %
Eosinophils Absolute: 0.2 10*3/uL (ref 0.0–0.5)
Eosinophils Relative: 4 %
HCT: 37.9 % — ABNORMAL LOW (ref 39.0–52.0)
Hemoglobin: 12.7 g/dL — ABNORMAL LOW (ref 13.0–17.0)
Immature Granulocytes: 0 %
Lymphocytes Relative: 20 %
Lymphs Abs: 1 10*3/uL (ref 0.7–4.0)
MCH: 31.1 pg (ref 26.0–34.0)
MCHC: 33.5 g/dL (ref 30.0–36.0)
MCV: 92.7 fL (ref 80.0–100.0)
Monocytes Absolute: 0.5 10*3/uL (ref 0.1–1.0)
Monocytes Relative: 10 %
Neutro Abs: 3.3 10*3/uL (ref 1.7–7.7)
Neutrophils Relative %: 65 %
Platelet Count: 150 10*3/uL (ref 150–400)
RBC: 4.09 MIL/uL — ABNORMAL LOW (ref 4.22–5.81)
RDW: 15.6 % — ABNORMAL HIGH (ref 11.5–15.5)
WBC Count: 5.1 10*3/uL (ref 4.0–10.5)
nRBC: 0 % (ref 0.0–0.2)

## 2023-08-17 LAB — CMP (CANCER CENTER ONLY)
ALT: 45 U/L — ABNORMAL HIGH (ref 0–44)
AST: 24 U/L (ref 15–41)
Albumin: 3 g/dL — ABNORMAL LOW (ref 3.5–5.0)
Alkaline Phosphatase: 82 U/L (ref 38–126)
Anion gap: 6 (ref 5–15)
BUN: 9 mg/dL (ref 8–23)
CO2: 26 mmol/L (ref 22–32)
Calcium: 7.9 mg/dL — ABNORMAL LOW (ref 8.9–10.3)
Chloride: 102 mmol/L (ref 98–111)
Creatinine: 0.93 mg/dL (ref 0.61–1.24)
GFR, Estimated: >60 mL/min (ref >60)
Glucose, Bld: 111 mg/dL — ABNORMAL HIGH (ref 70–99)
Potassium: 4.3 mmol/L (ref 3.5–5.1)
Sodium: 134 mmol/L — ABNORMAL LOW (ref 135–145)
Total Bilirubin: 0.9 mg/dL (ref 0.0–1.2)
Total Protein: 5.1 g/dL — ABNORMAL LOW (ref 6.5–8.1)

## 2023-08-17 MED ORDER — BORTEZOMIB CHEMO SQ INJECTION 3.5 MG (2.5MG/ML)
1.3000 mg/m2 | Freq: Once | INTRAMUSCULAR | Status: AC
  Administered 2023-08-17: 3 mg via SUBCUTANEOUS
  Filled 2023-08-17: qty 1.2

## 2023-08-17 MED ORDER — DARATUMUMAB-HYALURONIDASE-FIHJ 1800-30000 MG-UT/15ML ~~LOC~~ SOLN
1800.0000 mg | Freq: Once | SUBCUTANEOUS | Status: AC
  Administered 2023-08-17: 1800 mg via SUBCUTANEOUS
  Filled 2023-08-17: qty 15

## 2023-08-17 NOTE — Assessment & Plan Note (Signed)
?   Medication side effects vs compression neuropathy.  Intermittent symptoms.  Monitor.

## 2023-08-17 NOTE — Progress Notes (Signed)
 Pt reports taking Dexamethasone , diphenhydramine , acetaminophen  and montelukast  premeds prior to arrival to clinic. Pharmacy aware.

## 2023-08-17 NOTE — Assessment & Plan Note (Signed)
 Chemotherapy plan as listed above

## 2023-08-17 NOTE — Progress Notes (Signed)
 Hematology/Oncology Progress note [phone] [phone]     Patient Care Team: Tye Gall, MD as PCP - General (Internal Medicine) Gwyn Leos, MD as Consulting Physician (Oncology)  CHIEF COMPLAINTS/PURPOSE OF CONSULTATION: Multiple Myeloma   Oncology History Overview Note  # LIGHT CHAIN MONOCLONAL GAMMOPATHY -DEC 2024-[PCP-SPEP-NEGATIVE; Hb 13; GFR-> 60 ca-WN; However, random UPEP- positive for M protein/Bence-Jones- Protein positive; kappa type.  JAN 2025- SPEP- NEG; K/l= 91 [K=400]. Dx= includes smoldering MM vs active MM. 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)   06/05/2023 Cancer Staging   Staging form: Plasma Cell Myeloma and Plasma Cell Disorders, AJCC 8th Edition - Clinical: Albumin (g/dL): 4, ISS: Stage II, High-risk cytogenetics: Absent, LDH: Normal - Signed by Gwyn Leos, MD on 06/05/2023 Albumin range (g/dL): Greater than or equal to 3.5 Cytogenetics: t(11;14) translocation   06/06/2023 Cancer Staging   Staging form: Plasma Cell Myeloma and Plasma Cell Disorders, AJCC 8th Edition - Clinical: RISS Stage I (Beta-2 -microglobulin (mg/L): 2.5, Albumin (g/dL): 4, ISS: Stage I, High-risk cytogenetics: Absent, LDH: Normal) - Signed by Gwyn Leos, MD on 06/06/2023 Stage prefix: Initial diagnosis Beta 2  microglobulin range (mg/L): Less than 3.5 Albumin range (g/dL): Greater than or equal to 3.5 Cytogenetics: t(11;14) translocation   07/27/2023 -  Chemotherapy   Patient is on Treatment Plan : MYELOMA NEWLY DIAGNOSED TRANSPLANT CANDIDATE DaraVRd (Daratumumab  SQ) with weekly bortezomib  (D1,8,15) q21d x 6 Cycles (Induction/Consolidation)      HISTORY OF PRESENTING ILLNESS: Patient ambulating-independently.  Jimmy [name] 68 y.o.  male with Multiple Myeloma [dx: march 2025] with multiple bone lesions-is here to proceed with with DARA-RVD- chemotherapy is here for a follow-up.   Patient follows up with Dr.Brahmanday who is off today, I am covering him to see this patient.  Skin rash has resolved.  He took premed prior to visit.  He has noticed right thigh tingling and numbness more pronouce since the start of treatment. Intermittent, he does not know the triggers. Usually happens on days prior to his chemotherapy.   Review of Systems  Constitutional:  Positive for malaise/fatigue. Negative for chills, diaphoresis, fever and weight loss.  HENT:  Negative for nosebleeds and sore throat.   Eyes:  Negative for double vision.  Respiratory:  Negative for cough, hemoptysis, sputum production, shortness of breath and wheezing.   Cardiovascular:  Negative for chest pain, palpitations, orthopnea and leg swelling.  Gastrointestinal:  Negative for abdominal pain, blood in stool, constipation, diarrhea, heartburn, melena, nausea and vomiting.  Genitourinary:  Negative for dysuria, frequency and urgency.  Musculoskeletal:  Negative for back pain and joint pain.  Skin: Negative.  Negative for itching and rash.  Neurological:  Positive for tingling. Negative for dizziness, sensory change, focal weakness, weakness and headaches.  Endo/Heme/Allergies:  Does not bruise/bleed easily.  Psychiatric/Behavioral:  Negative for depression. The patient is not nervous/anxious and does not have insomnia.  MEDICAL  HISTORY:  Past Medical History:  Diagnosis Date   Atrial fibrillation (HCC)    had loop recorder, PAcs after DCCV 09/2019   Atrial flutter (HCC)    Fatigue    HLD (hyperlipidemia)    On amiodarone therapy 09/15/2019   Pain of right lower leg 01/02/2022    SURGICAL HISTORY: Past Surgical History:  Procedure Laterality Date   afib surgical ablation  2021   CARDIOVERSION     2021 for Afib in [place]   CHOLECYSTECTOMY     GALLBLADDER SURGERY  2006   IR BONE MARROW BIOPSY & ASPIRATION  05/18/2023   MINIMALLY INVASIVE MAZE PROCEDURE     done in [place]  Dr R. Sullivan Endow   stye Right 09/2022    SOCIAL HISTORY: Social History   Socioeconomic History   Marital status: Married    Spouse name: Not on file   Number of children: Not on file   Years of education: Not on file   Highest education level: Bachelor's degree (e.g., BA, AB, BS)  Occupational History   Not on file  Tobacco Use   Smoking status: Never   Smokeless tobacco: Never  Vaping Use   Vaping status: Never Used  Substance and Sexual Activity   Alcohol use: Never   Drug use: Not Currently   Sexual activity: Not Currently  Other Topics Concern   Not on file  Social History Narrative   2 sons and 1 daughter    Married    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    No guns, wears seat belt, safe in relationship   Vegan x 8-9 years as of 07/06/20    Social Drivers of Corporate investment banker Strain: Low Risk  (04/16/2023)   Overall Financial Resource Strain (CARDIA)    Difficulty of Paying Living Expenses: Not hard at all  Food Insecurity: No Food Insecurity (04/16/2023)   Hunger Vital Sign    Worried About Running Out of Food in the Last Year: Never true    Ran Out of Food in the Last Year: Never true  Transportation Needs: No Transportation Needs (04/16/2023)   PRAPARE - Administrator, Civil Service (Medical): No    Lack of Transportation (Non-Medical): No  Physical Activity: Unknown (04/16/2023)    Exercise Vital Sign    Days of Exercise per Week: 0 days    Minutes of Exercise per Session: Not on file  Stress: No Stress Concern Present (04/16/2023)   [name]    Feeling of Stress : Not at all  Social Connections: Moderately Integrated (04/16/2023)   Social Connection and Isolation Panel [NHANES]    Frequency of Communication with Friends and Family: Twice a week    Frequency of Social Gatherings with Friends and Family: Twice a week    Attends Religious Services: 1 to 4 times per year    Active Member of [employer] or Organizations: No    Attends Banker Meetings: Never    Marital Status: Married  Catering manager Violence: Not At Risk (04/16/2023)   Humiliation, Afraid, Rape, and Kick questionnaire    Fear of Current or Ex-Partner: No    Emotionally Abused: No    Physically Abused: No    Sexually Abused: No    FAMILY HISTORY: Family History  Problem Relation Age of Onset   COPD Mother    Atrial fibrillation Mother        ?  due to Afib died 75   Cancer Father        lung cancer smoker age 12    Heart disease Father        bypass at 30    Atrial fibrillation Sister     ALLERGIES:  has no known allergies.  MEDICATIONS:  Current Outpatient Medications  Medication Sig Dispense Refill   acetaminophen  (TYLENOL ) 325 MG tablet Take 650 mg by mouth once a week. Take 1 hour prior to infusion appointment.     acyclovir  (ZOVIRAX ) 400 MG tablet Take 1 tablet (400 mg total) by mouth 2 (two) times daily. 180 tablet 1   ASPIRIN 81 PO Take 81 mg by mouth daily.     cyanocobalamin  1000 MCG tablet Take 2,000 mcg by mouth.     dexamethasone  (DECADRON ) 4 MG tablet Take 5 tabs (20mg ) by mouth 1 hour prior to weekly infusion appointment and take 5 tabs (20mg ) the day after weekly infusion appointment. Take with with food . 120 tablet 0   diphenhydrAMINE  (BENADRYL ) 50 MG capsule Take 50 mg by mouth once a week. Take 1  hour prior to infusion appointment.     hydrOXYzine  (ATARAX ) 10 MG tablet Take 1 tablet (10 mg total) by mouth 3 (three) times daily as needed. 30 tablet 0   montelukast  (SINGULAIR ) 10 MG tablet Take 1 tablet (10 mg total) by mouth once a week. Take 1 hour prior to infusion appointment. 40 tablet 0   Multiple Vitamin (CALCIUM COMPLEX PO) Take by mouth daily.     Multiple Vitamins-Minerals (MULTIVITAMIN WOMEN PO) Take by mouth daily.     Omega-3 1400 MG CAPS      ondansetron  (ZOFRAN ) 8 MG tablet One pill every 8 hours as needed for nausea/vomitting. 40 tablet 1   predniSONE  (DELTASONE ) 20 MG tablet Take 3 pills once a day -for 3 days; then 2 pills once a day for 3 days and then 1 pill -do not stop until further directions. Take once a day with food- in AM. 40 tablet 0   prochlorperazine  (COMPAZINE ) 10 MG tablet Take 1 tablet (10 mg total) by mouth every 6 (six) hours as needed for nausea or vomiting. 40 tablet 1   Turmeric (CURCUMIN 95) 500 MG CAPS Take 500 mg by mouth 2 (two) times daily.     Vitamin D -Vitamin K (VITAMIN K2-VITAMIN D3 PO) Take by mouth daily.     lenalidomide  (REVLIMID ) 25 MG capsule Take 1 capsule (25 mg total) by mouth daily. Take for 14 days, then hold for 7 days. Repeat every 21 days. (Patient not taking: Reported on 08/17/2023) 7 capsule 0   No current facility-administered medications for this visit.   PHYSICAL EXAMINATION:   Vitals:   08/17/23 0845  BP: 106/75  Pulse: 88  Resp: 18  Temp: (!) 96.7 F (35.9 C)  SpO2: 100%     Filed Weights   08/17/23 0845  Weight: 241 lb 11.2 oz (109.6 kg)       Physical Exam Vitals and nursing note reviewed.  HENT:     Head: Normocephalic and atraumatic.     Mouth/Throat:     Pharynx: Oropharynx is clear.  Eyes:     Extraocular Movements: Extraocular movements intact.     Pupils: Pupils are equal, round, and reactive to light.  Cardiovascular:     Rate and Rhythm: Normal rate and regular rhythm.  Abdominal:      Palpations: Abdomen is soft.  Musculoskeletal:  General: Normal range of motion.     Cervical back: Normal range of motion.  Skin:    General: Skin is warm.  Neurological:     General: No focal deficit present.     Mental Status: He is alert and oriented to person, place, and time.  Psychiatric:        Behavior: Behavior normal.        Judgment: Judgment normal.    Positive for bilateral gynecomastia.      LABORATORY DATA:  I have reviewed the data as listed Lab Results  Component Value Date   WBC 5.1 08/17/2023   HGB 12.7 (L) 08/17/2023   HCT 37.9 (L) 08/17/2023   MCV 92.7 08/17/2023   PLT 150 08/17/2023   Recent Labs    08/03/23 0847 08/10/23 0832 08/17/23 0831  NA 133* 136 134*  K 4.3 4.0 4.3  CL 100 101 102  CO2 26 28 26   GLUCOSE 146* 101* 111*  BUN 9 20 9   CREATININE 0.92 0.76 0.93  CALCIUM 7.9* 8.2* 7.9*  GFRNONAA >60 >60 >60  PROT 5.4* 5.2* 5.1*  ALBUMIN 3.2* 3.2* 3.0*  AST 19 18 24   ALT 32 50* 45*  ALKPHOS 64 80 82  BILITOT 1.0 1.0 0.9   No results found.   Lab Results  Component Value Date   KPAFRELGTCHN 490.9 (H) 07/03/2023   KPAFRELGTCHN 439.1 (H) 04/16/2023   LAMBDASER 4.4 (L) 07/03/2023   LAMBDASER 4.8 (L) 04/16/2023   KAPLAMBRATIO 111.57 (H) 07/03/2023   KAPLAMBRATIO 91.48 (H) 04/16/2023     Multiple myeloma in relapse (HCC) # LIGHT CHAIN MONOCLONAL GAMMOPATHY -DEC 2024-[PCP-SPEP-NEGATIVE; Hb 13; GFR-> 60 ca-WN; However, random UPEP- positive for M protein/Bence-Jones- Protein positive; kappa type.  JAN 2025- SPEP- NEG; K/l= 91 [K=400]. Dx= includes smoldering MM vs active MM. FEB 2025- Bone marrow- BONE MARROW, ASPIRATE, CLOT, CORE:  -  Kappa restricted plasma cell neoplasm involving 50 to 90% of the  cellular marrow (patchy areas; average 70%)   cytogenetics: 11:14; 13 q- [standard risk] Negative Congo stain. MARCH 2025-PET Numerous lytic myelomatous bone lesions throughout the axial and appendicular skeleton noted on the CT scan  without associated discrete significant hypermetabolism.  There are 2 slightly hypermetabolic foci in noted in the right femur which appear to correlate with CT abnormalities. Low level hypermetabolism with SUV max of 2.26 and 2.20. A few other scattered bone lesions have similar FDG uptake. No hypermetabolic soft tissue masses or adenopathy. S/P evaluation with Dr.Choi [facility]- bone marrow evaluation]. Discussed with Dr.Choi. TRANSPLANT ELIGIBLE. 4/25- Currently on Dara-RVD [q 21 d cycle- Griffin trial-    # PROCEED WITH Today cycle #1 day- 15 - Dara/Velcade  today.  He took the premeds at home. Will start revlimid  1-14 with cycle #2 day-1 [in 2 weeks].   # Itchy scalp  rash face /and swelling around the eyes- improved on prednisone - currently on prednisone  20 mg/day- discontinue starting today.   # Multiple bone lesion-- [per pt s/p dental clearance] -Continue ca+Vit D BID. [Vit D 1000/d; 5000 q OD]. Monitor for now- hold off Zometa given the allergic reaction to above meds-  stable.   # DVT-ID Prophylaxis: asprin/acyclovir    Skin rash Improved.  See above management.   Encounter for antineoplastic chemotherapy Chemotherapy plan as listed above  Numbness and tingling of right lower extremity ? Medication side effects vs compression neuropathy.  Intermittent symptoms.  Monitor.   Follow up as currently scheduled.  All questions were answered. The patient knows  to call the clinic with any problems, questions or concerns.    Timmy Forbes, MD 08/17/2023 10:58 AM

## 2023-08-17 NOTE — Assessment & Plan Note (Signed)
 Improved.  See above management.

## 2023-08-17 NOTE — Patient Instructions (Signed)
 [facility] - A DEPT OF [facility]  Discharge Instructions: Thank you for choosing [facility] to provide your oncology and hematology care.  If you have a lab appointment with the Cancer Center, please go directly to the Cancer Center and check in at the registration area.  Wear comfortable clothing and clothing appropriate for easy access to any Portacath or PICC line.   We strive to give you quality time with your provider. You may need to reschedule your appointment if you arrive late (15 or more minutes).  Arriving late affects you and other patients whose appointments are after yours.  Also, if you miss three or more appointments without notifying the office, you may be dismissed from the clinic at the provider's discretion.      For prescription refill requests, have your pharmacy contact our office and allow 72 hours for refills to be completed.    Today you received the following chemotherapy and/or immunotherapy agents Velcade  & Darzalex       To help prevent nausea and vomiting after your treatment, we encourage you to take your nausea medication as directed.  BELOW ARE SYMPTOMS THAT SHOULD BE REPORTED IMMEDIATELY: *FEVER GREATER THAN 100.4 F (38 C) OR HIGHER *CHILLS OR SWEATING *NAUSEA AND VOMITING THAT IS NOT CONTROLLED WITH YOUR NAUSEA MEDICATION *UNUSUAL SHORTNESS OF BREATH *UNUSUAL BRUISING OR BLEEDING *URINARY PROBLEMS (pain or burning when urinating, or frequent urination) *BOWEL PROBLEMS (unusual diarrhea, constipation, pain near the anus) TENDERNESS IN MOUTH AND THROAT WITH OR WITHOUT PRESENCE OF ULCERS (sore throat, sores in mouth, or a toothache) UNUSUAL RASH, SWELLING OR PAIN  UNUSUAL VAGINAL DISCHARGE OR ITCHING   Items with * indicate a potential emergency and should be followed up as soon as possible or go to the Emergency Department if any problems should occur.  Please show the CHEMOTHERAPY ALERT CARD or  IMMUNOTHERAPY ALERT CARD at check-in to the Emergency Department and triage nurse.  Should you have questions after your visit or need to cancel or reschedule your appointment, please contact [facility] - A DEPT OF [facility]  [phone] and follow the prompts.  Office hours are 8:00 a.m. to 4:30 p.m. Monday - Friday. Please note that voicemails left after 4:00 p.m. may not be returned until the following business day.  We are closed weekends and major holidays. You have access to a nurse at all times for urgent questions. Please call the main number to the clinic [phone] and follow the prompts.  For any non-urgent questions, you may also contact your provider using MyChart. We now offer e-Visits for anyone 64 and older to request care online for non-urgent symptoms. For details visit [email or web address].   Also download the MyChart app! Go to the app store, search "MyChart", open the app, select [facility], and log in with your MyChart username and password.

## 2023-08-17 NOTE — Assessment & Plan Note (Signed)
#   LIGHT CHAIN MONOCLONAL GAMMOPATHY -DEC 2024-[PCP-SPEP-NEGATIVE; Hb 13; GFR-> 60 ca-WN; However, random UPEP- positive for M protein/Bence-Jones- Protein positive; kappa type.  JAN 2025- SPEP- NEG; K/l= 91 [K=400]. Dx= includes smoldering MM vs active MM. FEB 2025- Bone marrow- BONE MARROW, ASPIRATE, CLOT, CORE:  -  Kappa restricted plasma cell neoplasm involving 50 to 90% of the  cellular marrow (patchy areas; average 70%)   cytogenetics: 11:14; 13 q- [standard risk] Negative Congo stain. MARCH 2025-PET Numerous lytic myelomatous bone lesions throughout the axial and appendicular skeleton noted on the CT scan without associated discrete significant hypermetabolism.  There are 2 slightly hypermetabolic foci in noted in the right femur which appear to correlate with CT abnormalities. Low level hypermetabolism with SUV max of 2.26 and 2.20. A few other scattered bone lesions have similar FDG uptake. No hypermetabolic soft tissue masses or adenopathy. S/P evaluation with Dr.Choi [facility]- bone marrow evaluation]. Discussed with Dr.Choi. TRANSPLANT ELIGIBLE. 4/25- Currently on Dara-RVD [q 21 d cycle- Griffin trial-    # PROCEED WITH Today cycle #1 day- 15 - Dara/Velcade  today.  He took the premeds at home. Will start revlimid  1-14 with cycle #2 day-1 [in 2 weeks].   # Itchy scalp  rash face /and swelling around the eyes- improved on prednisone - currently on prednisone  20 mg/day- discontinue starting today.   # Multiple bone lesion-- [per pt s/p dental clearance] -Continue ca+Vit D BID. [Vit D 1000/d; 5000 q OD]. Monitor for now- hold off Zometa given the allergic reaction to above meds-  stable.   # DVT-ID Prophylaxis: asprin/acyclovir

## 2023-08-20 ENCOUNTER — Encounter: Payer: Self-pay | Admitting: Internal Medicine

## 2023-08-20 LAB — KAPPA/LAMBDA LIGHT CHAINS
Kappa free light chain: 12.6 mg/L (ref 3.3–19.4)
Kappa, lambda light chain ratio: 3.5 — ABNORMAL HIGH (ref 0.26–1.65)
Lambda free light chains: 3.6 mg/L — ABNORMAL LOW (ref 5.7–26.3)

## 2023-08-21 LAB — MULTIPLE MYELOMA PANEL, SERUM
Albumin SerPl Elph-Mcnc: 2.7 g/dL — ABNORMAL LOW (ref 2.9–4.4)
Albumin/Glob SerPl: 1.5 (ref 0.7–1.7)
Alpha 1: 0.2 g/dL (ref 0.0–0.4)
Alpha2 Glob SerPl Elph-Mcnc: 0.7 g/dL (ref 0.4–1.0)
B-Globulin SerPl Elph-Mcnc: 0.7 g/dL (ref 0.7–1.3)
Gamma Glob SerPl Elph-Mcnc: 0.2 g/dL — ABNORMAL LOW (ref 0.4–1.8)
Globulin, Total: 1.9 g/dL — ABNORMAL LOW (ref 2.2–3.9)
IgA: 20 mg/dL — ABNORMAL LOW (ref 61–437)
IgG (Immunoglobin G), Serum: 284 mg/dL — ABNORMAL LOW (ref 603–1613)
IgM (Immunoglobulin M), Srm: 12 mg/dL — ABNORMAL LOW (ref 20–172)
Total Protein ELP: 4.6 g/dL — ABNORMAL LOW (ref 6.0–8.5)

## 2023-08-23 ENCOUNTER — Encounter: Payer: Self-pay | Admitting: Internal Medicine

## 2023-08-24 ENCOUNTER — Inpatient Hospital Stay

## 2023-08-24 ENCOUNTER — Encounter: Payer: Self-pay | Admitting: Nurse Practitioner

## 2023-08-24 ENCOUNTER — Inpatient Hospital Stay (HOSPITAL_BASED_OUTPATIENT_CLINIC_OR_DEPARTMENT_OTHER): Admitting: Nurse Practitioner

## 2023-08-24 VITALS — BP 117/69 | HR 72 | Temp 98.2°F | Resp 17 | Wt 240.0 lb

## 2023-08-24 DIAGNOSIS — C9002 Multiple myeloma in relapse: Secondary | ICD-10-CM

## 2023-08-24 DIAGNOSIS — R600 Localized edema: Secondary | ICD-10-CM | POA: Diagnosis not present

## 2023-08-24 DIAGNOSIS — Z5112 Encounter for antineoplastic immunotherapy: Secondary | ICD-10-CM | POA: Diagnosis not present

## 2023-08-24 LAB — CMP (CANCER CENTER ONLY)
ALT: 38 U/L (ref 0–44)
AST: 19 U/L (ref 15–41)
Albumin: 3.1 g/dL — ABNORMAL LOW (ref 3.5–5.0)
Alkaline Phosphatase: 71 U/L (ref 38–126)
Anion gap: 10 (ref 5–15)
BUN: 13 mg/dL (ref 8–23)
CO2: 27 mmol/L (ref 22–32)
Calcium: 8.1 mg/dL — ABNORMAL LOW (ref 8.9–10.3)
Chloride: 101 mmol/L (ref 98–111)
Creatinine: 0.79 mg/dL (ref 0.61–1.24)
GFR, Estimated: >60 mL/min (ref >60)
Glucose, Bld: 119 mg/dL — ABNORMAL HIGH (ref 70–99)
Potassium: 4.4 mmol/L (ref 3.5–5.1)
Sodium: 138 mmol/L (ref 135–145)
Total Bilirubin: 0.9 mg/dL (ref 0.0–1.2)
Total Protein: 5.2 g/dL — ABNORMAL LOW (ref 6.5–8.1)

## 2023-08-24 LAB — CBC WITH DIFFERENTIAL (CANCER CENTER ONLY)
Abs Immature Granulocytes: 0.02 10*3/uL (ref 0.00–0.07)
Basophils Absolute: 0 10*3/uL (ref 0.0–0.1)
Basophils Relative: 0 %
Eosinophils Absolute: 0.1 10*3/uL (ref 0.0–0.5)
Eosinophils Relative: 2 %
HCT: 36.3 % — ABNORMAL LOW (ref 39.0–52.0)
Hemoglobin: 12.2 g/dL — ABNORMAL LOW (ref 13.0–17.0)
Immature Granulocytes: 0 %
Lymphocytes Relative: 12 %
Lymphs Abs: 0.7 10*3/uL (ref 0.7–4.0)
MCH: 30.7 pg (ref 26.0–34.0)
MCHC: 33.6 g/dL (ref 30.0–36.0)
MCV: 91.2 fL (ref 80.0–100.0)
Monocytes Absolute: 0.3 10*3/uL (ref 0.1–1.0)
Monocytes Relative: 6 %
Neutro Abs: 4.6 10*3/uL (ref 1.7–7.7)
Neutrophils Relative %: 80 %
Platelet Count: 135 10*3/uL — ABNORMAL LOW (ref 150–400)
RBC: 3.98 MIL/uL — ABNORMAL LOW (ref 4.22–5.81)
RDW: 15.6 % — ABNORMAL HIGH (ref 11.5–15.5)
WBC Count: 5.7 10*3/uL (ref 4.0–10.5)
nRBC: 0 % (ref 0.0–0.2)

## 2023-08-24 MED ORDER — BORTEZOMIB CHEMO SQ INJECTION 3.5 MG (2.5MG/ML)
1.3000 mg/m2 | Freq: Once | INTRAMUSCULAR | Status: AC
  Administered 2023-08-24: 3 mg via SUBCUTANEOUS
  Filled 2023-08-24: qty 1.2

## 2023-08-24 MED ORDER — DARATUMUMAB-HYALURONIDASE-FIHJ 1800-30000 MG-UT/15ML ~~LOC~~ SOLN
1800.0000 mg | Freq: Once | SUBCUTANEOUS | Status: AC
Start: 2023-08-24 — End: 2023-08-24
  Administered 2023-08-24: 1800 mg via SUBCUTANEOUS
  Filled 2023-08-24: qty 15

## 2023-08-24 NOTE — Progress Notes (Unsigned)
**Jimmy [name] De-Identified via Obfuscation**  Jimmy Jimmy [name]  Patient Care Team: Jimmy Gall, MD as PCP - General (Internal Medicine) Jimmy Leos, MD as Consulting Physician (Oncology)  CHIEF COMPLAINTS/PURPOSE OF CONSULTATION: Multiple Myeloma   Oncology History Overview Jimmy [name]  # LIGHT CHAIN MONOCLONAL GAMMOPATHY -DEC 2024-[PCP-SPEP-NEGATIVE; Hb 13; GFR-> 60 ca-WN; However, random UPEP- positive for M protein/Bence-Jones- Protein positive; kappa type.  JAN 2025- SPEP- NEG; K/l= 91 [K=400]. Dx= includes smoldering MM vs active MM. 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)   06/05/2023 Cancer Staging   Staging form: Plasma Cell Myeloma and Plasma Cell Disorders, AJCC 8th Edition - Clinical: Albumin (g/dL): 4, ISS: Stage II, High-risk cytogenetics: Absent, LDH: Normal - Signed by Jimmy Leos, MD on 06/05/2023 Albumin range (g/dL): Greater than or equal to 3.5 Cytogenetics: t(11;14) translocation   06/06/2023 Cancer Staging   Staging form: Plasma Cell Myeloma and Plasma Cell Disorders, AJCC 8th Edition - Clinical: RISS Stage I (Beta-2 -microglobulin (mg/L): 2.5, Albumin (g/dL): 4, ISS: Stage I, High-risk cytogenetics: Absent, LDH: Normal) - Signed by Jimmy Leos, MD on 06/06/2023 Stage prefix: Initial diagnosis Beta 2 microglobulin range (mg/L): Less than 3.5 Albumin  range (g/dL): Greater than or equal to 3.5 Cytogenetics: t(11;14) translocation   07/27/2023 -  Chemotherapy   Patient is on Treatment Plan : MYELOMA NEWLY DIAGNOSED TRANSPLANT CANDIDATE DaraVRd (Daratumumab  SQ) with weekly bortezomib  (D1,8,15) q21d x 6 Cycles (Induction/Consolidation)      HISTORY OF PRESENTING ILLNESS: Patient ambulating-independently. Accompanied by wife.   Jimmy COLDWELL 68 y.o.  male with Multiple Myeloma [dx: march 2025] with multiple bone lesions-is here to proceed with with DARA-RVD- chemotherapy is here for a follow-up.   GIVEN RASH/ HELD cycle #1 day- 8 Dara-RVD- appx 1 week ago.  Started on prednisone .  Noted to have numbness bilateral upper extremities.  Patient noted to have significant improvement of the rash/itch on the prednisone .  Patient continues to complain of on going fatigue.  Otherwise denies any tingling and numbness in the extremities.  No falls.   Review of Systems  Constitutional:  Positive for malaise/fatigue. Negative for chills, diaphoresis, fever and weight loss.  HENT:  Negative for nosebleeds and sore throat.   Eyes:  Negative for double vision.  Respiratory:  Negative for cough, hemoptysis, sputum production, shortness of breath and wheezing.   Cardiovascular:  Negative for chest pain, palpitations, orthopnea and leg swelling.  Gastrointestinal:  Negative for abdominal pain, blood in stool, constipation, diarrhea, heartburn, melena, nausea and vomiting.  Genitourinary:  Negative for dysuria, frequency and urgency.  Musculoskeletal:  Negative for back pain and joint pain.  Skin: Negative.  Negative for itching and rash.  Neurological:  Negative for dizziness, tingling, focal weakness, weakness and headaches.  Endo/Heme/Allergies:  Does not bruise/bleed easily.  Psychiatric/Behavioral:  Negative for depression. The patient is not nervous/anxious and does not have insomnia.     MEDICAL HISTORY:  Past Medical  History:  Diagnosis Date    Atrial fibrillation (HCC)    had loop recorder, PAcs after DCCV 09/2019   Atrial flutter (HCC)    Fatigue    HLD (hyperlipidemia)    On amiodarone therapy 09/15/2019   Pain of right lower leg 01/02/2022    SURGICAL HISTORY: Past Surgical History:  Procedure Laterality Date   afib surgical ablation  2021   CARDIOVERSION     2021 for Afib in [place]   CHOLECYSTECTOMY     GALLBLADDER SURGERY  2006   IR BONE MARROW BIOPSY & ASPIRATION  05/18/2023   MINIMALLY INVASIVE MAZE PROCEDURE     done in [place]  Dr R. Sullivan Endow   stye Right 09/2022    SOCIAL HISTORY: Social History   Socioeconomic History   Marital status: Married    Spouse name: Not on file   Number of children: Not on file   Years of education: Not on file   Highest education level: Bachelor's degree (e.g., BA, AB, BS)  Occupational History   Not on file  Tobacco Use   Smoking status: Never   Smokeless tobacco: Never  Vaping Use   Vaping status: Never Used  Substance and Sexual Activity   Alcohol use: Never   Drug use: Not Currently   Sexual activity: Not Currently  Other Topics Concern   Not on file  Social History Narrative   2 sons and 1 daughter    Married    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    No guns, wears seat belt, safe in relationship   Vegan x 8-9 years as of 07/06/20    Social Drivers of Corporate investment banker Strain: Low Risk  (04/16/2023)   Overall Financial Resource Strain (CARDIA)    Difficulty of Paying Living Expenses: Not hard at all  Food Insecurity: No Food Insecurity (04/16/2023)   Hunger Vital Sign    Worried About Running Out of Food in the Last Year: Never true    Ran Out of Food in the Last Year: Never true  Transportation Needs: No Transportation Needs (04/16/2023)   PRAPARE - Administrator, Civil Service (Medical): No    Lack of Transportation (Non-Medical): No  Physical Activity: Unknown (04/16/2023)   Exercise Vital Sign    Days of Exercise per Week: 0  days    Minutes of Exercise per Session: Not on file  Stress: No Stress Concern Present (04/16/2023)   [name]    Feeling of Stress : Not at all  Social Connections: Moderately Integrated (04/16/2023)   Social Connection and Isolation Panel [NHANES]    Frequency of Communication with Friends and Family: Twice a week    Frequency of Social Gatherings with Friends and Family: Twice a week    Attends Religious Services: 1 to 4 times per year    Active Member of [employer] or Organizations: No    Attends Banker Meetings: Never    Marital Status: Married  Catering manager Violence: Not At Risk (04/16/2023)   Humiliation, Afraid, Rape, and Kick questionnaire    Fear of Current or Ex-Partner: No    Emotionally Abused: No    Physically Abused: No    Sexually Abused: No    FAMILY HISTORY: Family History  Problem Relation Age of Onset   COPD Mother    Atrial fibrillation Mother        ?  due to Afib died 68   Cancer Father        lung cancer smoker age 73    Heart disease Father        bypass at 49    Atrial fibrillation Sister     ALLERGIES:  has no known allergies.  MEDICATIONS:  Current Outpatient Medications  Medication Sig Dispense Refill   acetaminophen  (TYLENOL ) 325 MG tablet Take 650 mg by mouth once a week. Take 1 hour prior to infusion appointment.     acyclovir  (ZOVIRAX ) 400 MG tablet Take 1 tablet (400 mg total) by mouth 2 (two) times daily. 180 tablet 1   ASPIRIN 81 PO Take 81 mg by mouth daily.     cyanocobalamin  1000 MCG tablet Take 2,000 mcg by mouth.     dexamethasone  (DECADRON ) 4 MG tablet Take 5 tabs (20mg ) by mouth 1 hour prior to weekly infusion appointment and take 5 tabs (20mg ) the day after weekly infusion appointment. Take with with food . 120 tablet 0   diphenhydrAMINE  (BENADRYL ) 50 MG capsule Take 50 mg by mouth once a week. Take 1 hour prior to infusion appointment.     hydrOXYzine   (ATARAX ) 10 MG tablet Take 1 tablet (10 mg total) by mouth 3 (three) times daily as needed. 30 tablet 0   montelukast  (SINGULAIR ) 10 MG tablet Take 1 tablet (10 mg total) by mouth once a week. Take 1 hour prior to infusion appointment. 40 tablet 0   Multiple Vitamin (CALCIUM COMPLEX PO) Take by mouth daily.     Multiple Vitamins-Minerals (MULTIVITAMIN WOMEN PO) Take by mouth daily.     Omega-3 1400 MG CAPS      ondansetron  (ZOFRAN ) 8 MG tablet One pill every 8 hours as needed for nausea/vomitting. 40 tablet 1   predniSONE  (DELTASONE ) 20 MG tablet Take 3 pills once a day -for 3 days; then 2 pills once a day for 3 days and then 1 pill -do not stop until further directions. Take once a day with food- in AM. 40 tablet 0   prochlorperazine  (COMPAZINE ) 10 MG tablet Take 1 tablet (10 mg total) by mouth every 6 (six) hours as needed for nausea or vomiting. 40 tablet 1   Turmeric (CURCUMIN 95) 500 MG CAPS Take 500 mg by mouth 2 (two) times daily.     Vitamin D -Vitamin K (VITAMIN K2-VITAMIN D3 PO) Take by mouth daily.     lenalidomide  (REVLIMID ) 25 MG capsule Take 1 capsule (25 mg total) by mouth daily. Take for 14 days, then hold for 7 days. Repeat every 21 days. (Patient not taking: Reported on 08/24/2023) 7 capsule 0   No current facility-administered medications for this visit.   PHYSICAL EXAMINATION:   Vitals:   08/24/23 1040  BP: 117/69  Pulse: 72  Resp: 17  Temp: 98.2 F (36.8 C)  SpO2: 100%     Filed Weights   08/24/23 1040  Weight: 240 lb (108.9 kg)     Positive for bilateral gynecomastia.   Facial/torso rash- see below  Physical Exam Vitals and nursing Jimmy [name] reviewed.  HENT:     Head: Normocephalic and atraumatic.     Mouth/Throat:     Pharynx: Oropharynx is clear.  Eyes:     Extraocular Movements: Extraocular movements intact.     Pupils: Pupils are equal, round, and reactive to light.  Cardiovascular:     Rate and Rhythm: Normal rate and regular rhythm.  Abdominal:      Palpations: Abdomen is soft.  Musculoskeletal:        General: Normal range of motion.     Cervical back: Normal range of motion.  Skin:    General: Skin is warm.  Neurological:     General: No focal deficit present.     Mental Status: He is alert and oriented to person, place, and time.  Psychiatric:        Behavior: Behavior normal.        Judgment: Judgment normal.       LABORATORY DATA:  I have reviewed the data as listed Lab Results  Component Value Date   WBC 5.7 08/24/2023   HGB 12.2 (L) 08/24/2023   HCT 36.3 (L) 08/24/2023   MCV 91.2 08/24/2023   PLT 135 (L) 08/24/2023   Recent Labs    08/10/23 0832 08/17/23 0831 08/24/23 1014  NA 136 134* 138  K 4.0 4.3 4.4  CL 101 102 101  CO2 28 26 27   GLUCOSE 101* 111* 119*  BUN 20 9 13   CREATININE 0.76 0.93 0.79  CALCIUM 8.2* 7.9* 8.1*  GFRNONAA >60 >60 >60  PROT 5.2* 5.1* 5.2*  ALBUMIN 3.2* 3.0* 3.1*  AST 18 24 19   ALT 50* 45* 38  ALKPHOS 80 82 71  BILITOT 1.0 0.9 0.9   No results found.   Lab Results  Component Value Date   KPAFRELGTCHN 12.6 08/17/2023   KPAFRELGTCHN 490.9 (H) 07/03/2023   KPAFRELGTCHN 439.1 (H) 04/16/2023   LAMBDASER 3.6 (L) 08/17/2023   LAMBDASER 4.4 (L) 07/03/2023   LAMBDASER 4.8 (L) 04/16/2023   KAPLAMBRATIO 3.50 (H) 08/17/2023   KAPLAMBRATIO 111.57 (H) 07/03/2023   KAPLAMBRATIO 91.48 (H) 04/16/2023    Assessment & Plan:  Multiple myeloma in relapse   # LIGHT CHAIN MONOCLONAL GAMMOPATHY -DEC 2024-[PCP-SPEP-NEGATIVE; Hb 13; GFR-> 60 ca-WN; However, random UPEP- positive for M protein/Bence-Jones- Protein positive; kappa type.  JAN 2025- SPEP- NEG; K/l= 91 [K=400]. Dx= includes smoldering MM vs active MM. FEB 2025- Bone marrow- BONE MARROW, ASPIRATE, CLOT, CORE:  -  Kappa restricted plasma cell neoplasm involving 50 to 90% of the  cellular marrow (patchy areas; average 70%)   cytogenetics: 11:14; 13 q- [standard risk] Negative Congo stain. MARCH 2025-PET Numerous lytic myelomatous  bone lesions throughout the axial and appendicular skeleton noted on the CT scan without associated discrete significant hypermetabolism.  There are 2 slightly hypermetabolic foci in noted in the right femur which appear to correlate with CT abnormalities. Low level hypermetabolism with SUV max of 2.26 and 2.20. A few other scattered bone lesions have similar FDG uptake. No hypermetabolic soft tissue masses or adenopathy. S/P evaluation with Dr. Wendall Halls [facility]- bone marrow evaluation]. Discussed with Dr.Choi. TRANSPLANT ELIGIBLE. 4/25- Currently on Dara-RVD [q 21 d cycle- Griffin trial-     # PROCEED WITH Today cycle #1 day- 15 - Dara/Velcade  today.  He took the premeds at home. Will start revlimid  1-14 with cycle #2 day-1 [in 2 weeks].    # Itchy scalp  rash face /and swelling around the eyes- improved on prednisone - currently on prednisone  20 mg/day- discontinue starting today.    # Multiple bone lesion-- [per pt s/p dental clearance] -Continue ca+Vit D BID. [Vit D 1000/d; 5000 q OD]. Monitor for now- hold off Zometa given the allergic reaction to above meds-  stable.    # DVT-ID Prophylaxis: asprin/acyclovir   Skin rash Improved.  See above management.   Encounter for antineoplastic chemotherapy Chemotherapy plan as listed above  Numbness and tingling of right lower  extremity ? Medication side effects vs compression neuropathy.  Intermittent symptoms.  Monitor.   Disposition:  Proceed with D1C2 today Ultrasound RLE d/t edema Follow up per IS- la  No problem-specific Assessment & Plan notes found for this encounter.  All questions were answered. The patient knows to call the clinic with any problems, questions or concerns.    Nelda Balsam, NP 08/24/2023

## 2023-08-24 NOTE — Progress Notes (Unsigned)
 Concerns of worsening right leg swelling & neuropathy

## 2023-08-25 ENCOUNTER — Encounter: Payer: Self-pay | Admitting: Cardiology

## 2023-08-29 ENCOUNTER — Other Ambulatory Visit: Payer: Self-pay | Admitting: *Deleted

## 2023-08-29 ENCOUNTER — Ambulatory Visit: Attending: Cardiology | Admitting: Cardiology

## 2023-08-29 ENCOUNTER — Ambulatory Visit
Admission: RE | Admit: 2023-08-29 | Discharge: 2023-08-29 | Disposition: A | Discharge status: Home / Self Care | Source: Ambulatory Visit | Attending: Nurse Practitioner | Admitting: Nurse Practitioner

## 2023-08-29 ENCOUNTER — Encounter: Payer: Self-pay | Admitting: Internal Medicine

## 2023-08-29 ENCOUNTER — Encounter: Payer: Self-pay | Admitting: Cardiology

## 2023-08-29 VITALS — BP 107/71 | HR 96 | Ht 71.0 in | Wt 240.0 lb

## 2023-08-29 DIAGNOSIS — C9002 Multiple myeloma in relapse: Secondary | ICD-10-CM

## 2023-08-29 DIAGNOSIS — R600 Localized edema: Secondary | ICD-10-CM

## 2023-08-29 DIAGNOSIS — I4819 Other persistent atrial fibrillation: Secondary | ICD-10-CM

## 2023-08-29 NOTE — Progress Notes (Signed)
  Electrophysiology Office Note:    Date:  08/29/2023   ID:  Erwin, Nishiyama 31-May-1955, MRN [id number]  [facility] Cardiologist:  None  [facility] Electrophysiologist:  Boyce Byes, MD   Referring MD: Vinetta Greening, MD   Chief Complaint: Atrial fibrillation  History of Present Illness:    Mr. Jimmy [name] is a 68 year old man who I am seeing today for an evaluation of atrial fibrillation at the request of Dr. Sullivan Endow.  The patient has a history of a surgical A-fib ablation in 2021 with antral isolation, ligament of Marshall dissection and ganglionic blocks.  The left atrial appendage was also clipped.  This was done at [facility].  The patient's medical history includes multiple myeloma on chemotherapy.  The patient last saw Dr. Sullivan Endow via virtual appointment June 06, 2023.  It looks like the patient restarted his home amiodarone in February of this year after going back into atrial fibrillation on February 2.  He tolerates the atrial fibrillation well.  His rates are fairly well-controlled.  He had a cardioversion in the past at [facility].  He is off anticoagulation.    Their past medical, social and family history was reviewed.   ROS:   Please see the history of present illness.    All other systems reviewed and are negative.  EKGs/Labs/Other Studies Reviewed:    The following studies were reviewed today:  Aug 11, 2021 echo EF 55-60 RV normal Trivial MR  Loop recorder interrogation shows persistent atrial fibrillation since around May 01, 2023.  EKG Interpretation Date/Time:  Wednesday Aug 29 2023 14:42:37 EDT Ventricular Rate:  96 PR Interval:    QRS Duration:  86 QT Interval:  374 QTC Calculation: 472 R Axis:   23  Text Interpretation: Atrial fibrillation Low voltage QRS Confirmed by Harvie Liner [phone]) on 08/29/2023 2:58:18 PM    Physical Exam:    VS:  BP 107/71 (BP Location: Right Arm, Patient Position: Sitting, Cuff Size: Large)    Pulse 96   Ht 5\' 11"  (1.803 m)   Wt 240 lb (108.9 kg)   SpO2 96%   BMI 33.47 kg/m     Wt Readings from Last 3 Encounters:  08/29/23 240 lb (108.9 kg)  08/24/23 240 lb (108.9 kg)  08/17/23 241 lb 11.2 oz (109.6 kg)     GEN: no distress CARD: Irregularly irregular, No MRG.  Trace pitting bilateral extremity edema to the ankles RESP: No IWOB. CTAB.        ASSESSMENT AND PLAN:    1. Persistent atrial fibrillation (HCC)     #Persistent atrial fibrillation #Status post left atrial appendage clip With prior surgical A-fib ablation in 2021.  Unclear if this is natural progression of disease or potentially triggered by some of the chemo agents or steroid.  Also possible would be involvement of the myocardium with his multiple myeloma.   Repeat echocardiogram Continue aspirin 81 mg by mouth once daily  #Multiple myeloma Follows with [facility].  Receiving chemo.  Follow-up with EP APP in 6 months.     Signed, Leanora Prophet. Marven Slimmer, MD, [facility] 08/29/2023 2:59 PM    Electrophysiology [facility]

## 2023-08-29 NOTE — Patient Instructions (Signed)
 Medication Instructions:  Your physician recommends that you continue on your current medications as directed. Please refer to the Current Medication list given to you today.  *If you need a refill on your cardiac medications before your next appointment, please call your pharmacy*  Testing/Procedures: Echocardiogram  Your physician has requested that you have an echocardiogram. Echocardiography is a painless test that uses sound waves to create images of your heart. It provides your doctor with information about the size and shape of your heart and how well your heart's chambers and valves are working. This procedure takes approximately one hour. There are no restrictions for this procedure. Please do NOT wear cologne, perfume, aftershave, or lotions (deodorant is allowed). Please arrive 15 minutes prior to your appointment time.  Please note: We ask at that you not bring children with you during ultrasound (echo/ vascular) testing. Due to room size and safety concerns, children are not allowed in the ultrasound rooms during exams. Our front office staff cannot provide observation of children in our lobby area while testing is being conducted. An adult accompanying a patient to their appointment will only be allowed in the ultrasound room at the discretion of the ultrasound technician under special circumstances. We apologize for any inconvenience.  Follow-Up: At [facility], you and your health needs are our priority.  As part of our continuing mission to provide you with exceptional heart care, our providers are all part of one team.  This team includes your primary Cardiologist (physician) and Advanced Practice Providers or APPs (Physician Assistants and Nurse Practitioners) who all work together to provide you with the care you need, when you need it.  Your next appointment:   6 months  Provider:   You will see one of the following Advanced Practice Providers on your designated  Care Team:   Mertha Abrahams, Kennard Pea [address]" [place], PA-C Suzann Riddle, NP Creighton Doffing, NP

## 2023-08-30 ENCOUNTER — Encounter: Payer: Self-pay | Admitting: Internal Medicine

## 2023-08-30 NOTE — Progress Notes (Signed)
 I called patient, to check on the rash.  Improved-on holding Revlimid .  Keep appointment as planned tomorrow.

## 2023-08-31 ENCOUNTER — Inpatient Hospital Stay

## 2023-08-31 ENCOUNTER — Inpatient Hospital Stay: Admitting: Internal Medicine

## 2023-08-31 ENCOUNTER — Telehealth: Payer: Self-pay

## 2023-08-31 ENCOUNTER — Encounter: Payer: Self-pay | Admitting: Internal Medicine

## 2023-08-31 VITALS — BP 103/68 | HR 77 | Temp 97.5°F | Resp 20 | Ht 71.0 in | Wt 238.4 lb

## 2023-08-31 DIAGNOSIS — C9002 Multiple myeloma in relapse: Secondary | ICD-10-CM | POA: Diagnosis not present

## 2023-08-31 DIAGNOSIS — C9000 Multiple myeloma not having achieved remission: Secondary | ICD-10-CM | POA: Diagnosis not present

## 2023-08-31 DIAGNOSIS — Z5112 Encounter for antineoplastic immunotherapy: Secondary | ICD-10-CM | POA: Diagnosis not present

## 2023-08-31 LAB — CMP (CANCER CENTER ONLY)
ALT: 31 U/L (ref 0–44)
AST: 14 U/L — ABNORMAL LOW (ref 15–41)
Albumin: 3 g/dL — ABNORMAL LOW (ref 3.5–5.0)
Alkaline Phosphatase: 71 U/L (ref 38–126)
Anion gap: 6 (ref 5–15)
BUN: 10 mg/dL (ref 8–23)
CO2: 26 mmol/L (ref 22–32)
Calcium: 7.8 mg/dL — ABNORMAL LOW (ref 8.9–10.3)
Chloride: 103 mmol/L (ref 98–111)
Creatinine: 0.93 mg/dL (ref 0.61–1.24)
GFR, Estimated: >60 mL/min (ref >60)
Glucose, Bld: 90 mg/dL (ref 70–99)
Potassium: 4.4 mmol/L (ref 3.5–5.1)
Sodium: 135 mmol/L (ref 135–145)
Total Bilirubin: 0.9 mg/dL (ref 0.0–1.2)
Total Protein: 5.4 g/dL — ABNORMAL LOW (ref 6.5–8.1)

## 2023-08-31 LAB — CBC WITH DIFFERENTIAL (CANCER CENTER ONLY)
Abs Immature Granulocytes: 0.02 10*3/uL (ref 0.00–0.07)
Basophils Absolute: 0 10*3/uL (ref 0.0–0.1)
Basophils Relative: 0 %
Eosinophils Absolute: 0.2 10*3/uL (ref 0.0–0.5)
Eosinophils Relative: 4 %
HCT: 38.3 % — ABNORMAL LOW (ref 39.0–52.0)
Hemoglobin: 12.7 g/dL — ABNORMAL LOW (ref 13.0–17.0)
Immature Granulocytes: 1 %
Lymphocytes Relative: 17 %
Lymphs Abs: 0.7 10*3/uL (ref 0.7–4.0)
MCH: 30.4 pg (ref 26.0–34.0)
MCHC: 33.2 g/dL (ref 30.0–36.0)
MCV: 91.6 fL (ref 80.0–100.0)
Monocytes Absolute: 0.5 10*3/uL (ref 0.1–1.0)
Monocytes Relative: 11 %
Neutro Abs: 2.9 10*3/uL (ref 1.7–7.7)
Neutrophils Relative %: 67 %
Platelet Count: 110 10*3/uL — ABNORMAL LOW (ref 150–400)
RBC: 4.18 MIL/uL — ABNORMAL LOW (ref 4.22–5.81)
RDW: 15.6 % — ABNORMAL HIGH (ref 11.5–15.5)
WBC Count: 4.3 10*3/uL (ref 4.0–10.5)
nRBC: 0 % (ref 0.0–0.2)

## 2023-08-31 MED ORDER — BORTEZOMIB CHEMO SQ INJECTION 3.5 MG (2.5MG/ML)
1.3000 mg/m2 | Freq: Once | INTRAMUSCULAR | Status: AC
  Administered 2023-08-31: 3 mg via SUBCUTANEOUS
  Filled 2023-08-31: qty 1.2

## 2023-08-31 MED ORDER — ACETAMINOPHEN 325 MG PO TABS
650.0000 mg | ORAL_TABLET | Freq: Once | ORAL | Status: AC
  Administered 2023-08-31: 650 mg via ORAL
  Filled 2023-08-31: qty 2

## 2023-08-31 MED ORDER — DEXAMETHASONE 4 MG PO TABS
20.0000 mg | ORAL_TABLET | Freq: Once | ORAL | Status: AC
  Administered 2023-08-31: 20 mg via ORAL
  Filled 2023-08-31: qty 5

## 2023-08-31 MED ORDER — MONTELUKAST SODIUM 10 MG PO TABS
10.0000 mg | ORAL_TABLET | Freq: Once | ORAL | Status: AC
  Administered 2023-08-31: 10 mg via ORAL
  Filled 2023-08-31: qty 1

## 2023-08-31 MED ORDER — DARATUMUMAB-HYALURONIDASE-FIHJ 1800-30000 MG-UT/15ML ~~LOC~~ SOLN
1800.0000 mg | Freq: Once | SUBCUTANEOUS | Status: AC
  Administered 2023-08-31: 1800 mg via SUBCUTANEOUS
  Filled 2023-08-31: qty 15

## 2023-08-31 MED ORDER — MONTELUKAST SODIUM 10 MG PO TABS: ORAL_TABLET | ORAL | 1 refills | Status: AC

## 2023-08-31 MED ORDER — DIPHENHYDRAMINE HCL 25 MG PO CAPS
25.0000 mg | ORAL_CAPSULE | Freq: Once | ORAL | Status: AC
  Administered 2023-08-31: 25 mg via ORAL
  Filled 2023-08-31: qty 1

## 2023-08-31 NOTE — Progress Notes (Signed)
 Patient stated he did not take pre meds today. He was not sure what was going on for today.

## 2023-08-31 NOTE — Patient Instructions (Signed)
#   HOLD Revlimid  until next viist-

## 2023-08-31 NOTE — Assessment & Plan Note (Addendum)
#   LIGHT CHAIN MONOCLONAL GAMMOPATHY -DEC 2024-[PCP-SPEP-NEGATIVE; Hb 13; GFR-> 60 ca-WN; However, random UPEP- positive for M protein/Bence-Jones- Protein positive; kappa type.  JAN 2025- SPEP- NEG; K/l= 91 [K=400]. FEB 2025- Bone marrow- BONE MARROW, ASPIRATE, CLOT, CORE:  -  Kappa restricted plasma cell neoplasm involving 50 to 90% of the  cellular marrow (patchy areas; average 70%)   cytogenetics: 11:14; 13 q- [standard risk] Negative Congo stain. MARCH 2025-PET Numerous lytic myelomatous bone lesions throughout the axial and appendicular skeleton noted on the CT scan without associated discrete significant hypermetabolism.   S/P evaluation with Dr.Choi [facility]- bone marrow evaluation]. Discussed with Dr.Choi. TRANSPLANT ELIGIBLE. 4/25- Currently on Dara-RVD [q 21 d cycle- Griffin trial-   # Proceed with cycle number 2-day 8- Dara-D-Dex [ [q 21 d cycle- Griffin trial-; HOWEVER, HOLD Revlimid  sec to possibly causing the  rash- for the next 2 weeks; consider dose reduction vs- Pom at next cycle.   #  rash on the face- after 3 days after cycle #2 day-1 treatment- resolved with benadryl /and clairtin. Stopped for 3 days- resolved.  Recommend Singulair  today.  Held Revlimid -monitor rash on daratumumab .  # Multiple bone lesion-- [per pt s/p dental clearance] -Continue ca+Vit D BID. [Vit D 1000/d; 5000 q OD]. Monitor for now-follow-up Zometa given the allergic reaction to above meds-hold given ongoing hypocalcemia.  # History of a flutter [s/p ablation in [place] - clip of Left atrial appendage]-no anticoagulation- on asprin; irregular rhythm-2D echo pending.  Stable.  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   # DVT-ID Prophylaxis: asprin/acyclovir   # ACP:   # # IV access:PIV  # 1-4 cycles-SQ Dara-velcade  weekly x4 cycles; 5-6 dara-q2 w; Velade-weekly; Zometa-??   Pre-meds-? PS # DISPOSITION: # chemo today- give pre-meds as ordered.  # 1 weeks-  MD; labs- cbc/cmp; Chemo;  #  2  weeks- MD; ; labs- cbc/cmp; Chemo; possible zometa  # 3 weeks- MD  labs- cbc/cmp; Chemo; MM; K/L light chains;  Dr.B

## 2023-08-31 NOTE — Telephone Encounter (Signed)
 Outreach made to office of Dr. Sullivan Endow.  Transferred to Dr. Gaylyn Keas assistant.  Left message requesting call back.

## 2023-08-31 NOTE — Telephone Encounter (Signed)
-----   Message from Nurse Marquette Sites sent at 08/29/2023  4:43 PM EDT ----- Regarding: establish remote monitoring for loop Dr. Axel Bohr MD [facility] [phone]

## 2023-08-31 NOTE — Patient Instructions (Signed)
 [facility] - A DEPT OF [facility]  Discharge Instructions: Thank you for choosing [facility] to provide your oncology and hematology care.  If you have a lab appointment with the Cancer Center, please go directly to the Cancer Center and check in at the registration area.  Wear comfortable clothing and clothing appropriate for easy access to any Portacath or PICC line.   We strive to give you quality time with your provider. You may need to reschedule your appointment if you arrive late (15 or more minutes).  Arriving late affects you and other patients whose appointments are after yours.  Also, if you miss three or more appointments without notifying the office, you may be dismissed from the clinic at the provider's discretion.      For prescription refill requests, have your pharmacy contact our office and allow 72 hours for refills to be completed.    Today you received the following chemotherapy and/or immunotherapy agents Velcade and Darzalex      To help prevent nausea and vomiting after your treatment, we encourage you to take your nausea medication as directed.  BELOW ARE SYMPTOMS THAT SHOULD BE REPORTED IMMEDIATELY: *FEVER GREATER THAN 100.4 F (38 C) OR HIGHER *CHILLS OR SWEATING *NAUSEA AND VOMITING THAT IS NOT CONTROLLED WITH YOUR NAUSEA MEDICATION *UNUSUAL SHORTNESS OF BREATH *UNUSUAL BRUISING OR BLEEDING *URINARY PROBLEMS (pain or burning when urinating, or frequent urination) *BOWEL PROBLEMS (unusual diarrhea, constipation, pain near the anus) TENDERNESS IN MOUTH AND THROAT WITH OR WITHOUT PRESENCE OF ULCERS (sore throat, sores in mouth, or a toothache) UNUSUAL RASH, SWELLING OR PAIN  UNUSUAL VAGINAL DISCHARGE OR ITCHING   Items with * indicate a potential emergency and should be followed up as soon as possible or go to the Emergency Department if any problems should occur.  Please show the CHEMOTHERAPY ALERT CARD or  IMMUNOTHERAPY ALERT CARD at check-in to the Emergency Department and triage nurse.  Should you have questions after your visit or need to cancel or reschedule your appointment, please contact [facility] - A DEPT OF [facility]  [phone] and follow the prompts.  Office hours are 8:00 a.m. to 4:30 p.m. Monday - Friday. Please note that voicemails left after 4:00 p.m. may not be returned until the following business day.  We are closed weekends and major holidays. You have access to a nurse at all times for urgent questions. Please call the main number to the clinic [phone] and follow the prompts.  For any non-urgent questions, you may also contact your provider using MyChart. We now offer e-Visits for anyone 72 and older to request care online for non-urgent symptoms. For details visit [email or web address].   Also download the MyChart app! Go to the app store, search "MyChart", open the app, select [facility], and log in with your MyChart username and password.

## 2023-08-31 NOTE — Addendum Note (Signed)
 Addended by: Gwyn Leos on: 08/31/2023 10:17 AM   Modules accepted: Orders

## 2023-08-31 NOTE — Progress Notes (Signed)
 [facility] CONSULT NOTE  Patient Care Team: Tye Gall, MD as PCP - General (Internal Medicine) Boyce Byes, MD as PCP - Electrophysiology (Cardiology) Gwyn Leos, MD as Consulting Physician (Oncology)  CHIEF COMPLAINTS/PURPOSE OF CONSULTATION: Multiple Myeloma   Oncology History Overview Note  # LIGHT CHAIN MONOCLONAL GAMMOPATHY -DEC 2024-[PCP-SPEP-NEGATIVE; Hb 13; GFR-> 60 ca-WN; However, random UPEP- positive for M protein/Bence-Jones- Protein positive; kappa type.  JAN 2025- SPEP- NEG; K/l= 91 [K=400]. Dx= includes smoldering MM vs active MM. 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)   06/05/2023 Cancer Staging   Staging form: Plasma Cell Myeloma and Plasma Cell Disorders, AJCC 8th Edition - Clinical: Albumin (g/dL): 4, ISS: Stage II, High-risk cytogenetics: Absent, LDH: Normal - Signed by Gwyn Leos, MD on 06/05/2023 Albumin range (g/dL): Greater than or equal to 3.5 Cytogenetics: t(11;14) translocation   06/06/2023 Cancer Staging   Staging form: Plasma Cell Myeloma and Plasma Cell Disorders, AJCC 8th Edition - Clinical: RISS Stage I (Beta-2 -microglobulin (mg/L): 2.5, Albumin (g/dL): 4, ISS: Stage I, High-risk cytogenetics: Absent, LDH: Normal) - Signed by Gwyn Leos, MD on 06/06/2023 Stage prefix: Initial  diagnosis Beta 2 microglobulin range (mg/L): Less than 3.5 Albumin range (g/dL): Greater than or equal to 3.5 Cytogenetics: t(11;14) translocation   07/27/2023 -  Chemotherapy   Patient is on Treatment Plan : MYELOMA NEWLY DIAGNOSED TRANSPLANT CANDIDATE DaraVRd (Daratumumab  SQ) with weekly bortezomib  (D1,8,15) q21d x 6 Cycles (Induction/Consolidation)      HISTORY OF PRESENTING ILLNESS: Patient ambulating-independently. Accompanied by wife.   Jimmy [name] 68 y.o.  male with A.fib [watchman device; not on anti-coagulation] Multiple Myeloma [dx: march 2025] with multiple bone lesions-is here to proceed with with DARA-RVD- chemotherapy is here for a follow-up.   Noted to rash on the face- after 3 days after cycle #2 day-1 treatment- resolved with benadryl /and clairtin. Stopped for 3 days- resolved.   Patient stated he did not take pre meds today. He was not sure what was going on for today. Noted to have numbness bilateral upper extremities.  Patient continues to complain of on going fatigue.  Complains of weight gain. And leg swelling.  No falls.   Review of Systems  Constitutional:  Positive for malaise/fatigue. Negative for chills, diaphoresis, fever and weight loss.  HENT:  Negative for nosebleeds and sore throat.   Eyes:  Negative for double vision.  Respiratory:  Negative for cough, hemoptysis, sputum production, shortness of breath and wheezing.   Cardiovascular:  Negative for chest pain, palpitations, orthopnea and leg swelling.  Gastrointestinal:  Negative for abdominal pain, blood in stool, constipation, diarrhea, heartburn, melena, nausea and vomiting.  Genitourinary:  Negative for dysuria, frequency and urgency.  Musculoskeletal:  Negative for back pain and joint pain.  Skin: Negative.  Negative for itching and rash.  Neurological:  Negative for dizziness, tingling, focal weakness, weakness and headaches.  Endo/Heme/Allergies:  Does not  bruise/bleed easily.   Psychiatric/Behavioral:  Negative for depression. The patient is not nervous/anxious and does not have insomnia.     MEDICAL HISTORY:  Past Medical History:  Diagnosis Date   Atrial fibrillation (HCC)    had loop recorder, PAcs after DCCV 09/2019   Atrial flutter (HCC)    Fatigue    HLD (hyperlipidemia)    On amiodarone therapy 09/15/2019   Pain of right lower leg 01/02/2022    SURGICAL HISTORY: Past Surgical History:  Procedure Laterality Date   afib surgical ablation  2021   CARDIOVERSION     2021 for Afib in [place]   CHOLECYSTECTOMY     GALLBLADDER SURGERY  2006   IR BONE MARROW BIOPSY & ASPIRATION  05/18/2023   MINIMALLY INVASIVE MAZE PROCEDURE     done in [place]  Dr R. Sullivan Endow   stye Right 09/2022    SOCIAL HISTORY: Social History   Socioeconomic History   Marital status: Married    Spouse name: Not on file   Number of children: Not on file   Years of education: Not on file   Highest education level: Bachelor's degree (e.g., BA, AB, BS)  Occupational History   Not on file  Tobacco Use   Smoking status: Never   Smokeless tobacco: Never  Vaping Use   Vaping status: Never Used  Substance and Sexual Activity   Alcohol use: Never   Drug use: Not Currently   Sexual activity: Not Currently  Other Topics Concern   Not on file  Social History Narrative   2 sons and 1 daughter    Married    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    No guns, wears seat belt, safe in relationship   Vegan x 8-9 years as of 07/06/20    Social Drivers of Corporate investment banker Strain: Low Risk  (04/16/2023)   Overall Financial Resource Strain (CARDIA)    Difficulty of Paying Living Expenses: Not hard at all  Food Insecurity: No Food Insecurity (04/16/2023)   Hunger Vital Sign    Worried About Running Out of Food in the Last Year: Never true    Ran Out of Food in the Last Year: Never true  Transportation Needs: No Transportation Needs (04/16/2023)   PRAPARE - Therapist, art (Medical): No    Lack of Transportation (Non-Medical): No  Physical Activity: Unknown (04/16/2023)   Exercise Vital Sign    Days of Exercise per Week: 0 days    Minutes of Exercise per Session: Not on file  Stress: No Stress Concern Present (04/16/2023)   [name]    Feeling of Stress : Not at all  Social Connections: Moderately Integrated (04/16/2023)   Social Connection and Isolation Panel [NHANES]    Frequency of Communication with Friends and Family: Twice a week    Frequency of Social Gatherings with Friends and Family: Twice a week    Attends Religious Services: 1 to 4 times per year    Active Member of [employer] or Organizations: No    Attends Banker Meetings: Never    Marital Status: Married  Catering manager Violence: Not At Risk (04/16/2023)   Humiliation, Afraid, Rape, and Kick questionnaire    Fear of Current or Ex-Partner: No    Emotionally Abused: No    Physically Abused: No    Sexually Abused: No    FAMILY  HISTORY: Family History  Problem Relation Age of Onset   COPD Mother    Atrial fibrillation Mother        ? due to Afib died 78   Cancer Father        lung cancer smoker age 75    Heart disease Father        bypass at 69    Atrial fibrillation Sister     ALLERGIES:  is allergic to revlimid  [lenalidomide ].  MEDICATIONS:  Current Outpatient Medications  Medication Sig Dispense Refill   acetaminophen  (TYLENOL ) 325 MG tablet Take 650 mg by mouth once a week. Take 1 hour prior to infusion appointment.     acyclovir  (ZOVIRAX ) 400 MG tablet Take 1 tablet (400 mg total) by mouth 2 (two) times daily. 180 tablet 1   ASPIRIN 81 PO Take 81 mg by mouth daily.     cyanocobalamin  1000 MCG tablet Take 2,000 mcg by mouth.     dexamethasone  (DECADRON ) 4 MG tablet Take 5 tabs (20mg ) by mouth 1 hour prior to weekly infusion appointment and take 5 tabs (20mg ) the day after  weekly infusion appointment. Take with with food . 120 tablet 0   diphenhydrAMINE  (BENADRYL ) 50 MG capsule Take 50 mg by mouth once a week. Take 1 hour prior to infusion appointment.     hydrOXYzine  (ATARAX ) 10 MG tablet Take 1 tablet (10 mg total) by mouth 3 (three) times daily as needed. 30 tablet 0   lenalidomide  (REVLIMID ) 25 MG capsule Take 1 capsule (25 mg total) by mouth daily. Take for 14 days, then hold for 7 days. Repeat every 21 days. 7 capsule 0   Multiple Vitamin (CALCIUM COMPLEX PO) Take by mouth daily.     Multiple Vitamins-Minerals (MULTIVITAMIN WOMEN PO) Take by mouth daily.     Omega-3 1400 MG CAPS      ondansetron  (ZOFRAN ) 8 MG tablet One pill every 8 hours as needed for nausea/vomitting. 40 tablet 1   predniSONE  (DELTASONE ) 20 MG tablet Take 3 pills once a day -for 3 days; then 2 pills once a day for 3 days and then 1 pill -do not stop until further directions. Take once a day with food- in AM. 40 tablet 0   prochlorperazine  (COMPAZINE ) 10 MG tablet Take 1 tablet (10 mg total) by mouth every 6 (six) hours as needed for nausea or vomiting. 40 tablet 1   Turmeric (CURCUMIN 95) 500 MG CAPS Take 500 mg by mouth 2 (two) times daily.     Vitamin D -Vitamin K (VITAMIN K2-VITAMIN D3 PO) Take by mouth daily.     montelukast  (SINGULAIR ) 10 MG tablet Take daily; and on days of infusion- Take 1 hour prior to infusion appointment. 90 tablet 1   No current facility-administered medications for this visit.   Facility-Administered Medications Ordered in Other Visits  Medication Dose Route Frequency Provider Last Rate Last Admin   acetaminophen  (TYLENOL ) tablet 650 mg  650 mg Oral Once Atif Chapple R, MD       bortezomib  SQ (VELCADE ) chemo injection (2.5mg /mL concentration) 3 mg  1.3 mg/m2 (Treatment Plan Recorded) Subcutaneous Once Berlynn Warsame R, MD       daratumumab -hyaluronidase -fihj (DARZALEX  FASPRO) 1800-30000 MG-UT/15ML chemo SQ injection 1,800 mg  1,800 mg Subcutaneous  Once Jeronimo Hellberg R, MD       diphenhydrAMINE  (BENADRYL ) capsule 25 mg  25 mg Oral Once Drea Jurewicz R, MD       montelukast  (SINGULAIR ) tablet 10 mg  10 mg  Oral QHS Dashonda Bonneau R, MD       PHYSICAL EXAMINATION:   Vitals:   08/31/23 0846  BP: 103/68  Pulse: 77  Resp: 20  Temp: (!) 97.5 F (36.4 C)  SpO2: 100%     Filed Weights   08/31/23 0846  Weight: 238 lb 6.4 oz (108.1 kg)     Positive for bilateral gynecomastia.   Facial/torso rash- see below  Physical Exam Vitals and nursing note reviewed.  HENT:     Head: Normocephalic and atraumatic.     Mouth/Throat:     Pharynx: Oropharynx is clear.  Eyes:     Extraocular Movements: Extraocular movements intact.     Pupils: Pupils are equal, round, and reactive to light.  Cardiovascular:     Rate and Rhythm: Normal rate and regular rhythm.  Abdominal:     Palpations: Abdomen is soft.  Musculoskeletal:        General: Normal range of motion.     Cervical back: Normal range of motion.  Skin:    General: Skin is warm.  Neurological:     General: No focal deficit present.     Mental Status: He is alert and oriented to person, place, and time.  Psychiatric:        Behavior: Behavior normal.        Judgment: Judgment normal.        LABORATORY DATA:  I have reviewed the data as listed Lab Results  Component Value Date   WBC 4.3 08/31/2023   HGB 12.7 (L) 08/31/2023   HCT 38.3 (L) 08/31/2023   MCV 91.6 08/31/2023   PLT 110 (L) 08/31/2023   Recent Labs    08/17/23 0831 08/24/23 1014 08/31/23 0840  NA 134* 138 135  K 4.3 4.4 4.4  CL 102 101 103  CO2 26 27 26   GLUCOSE 111* 119* 90  BUN 9 13 10   CREATININE 0.93 0.79 0.93  CALCIUM 7.9* 8.1* 7.8*  GFRNONAA >60 >60 >60  PROT 5.1* 5.2* 5.4*  ALBUMIN 3.0* 3.1* 3.0*  AST 24 19 14*  ALT 45* 38 31  ALKPHOS 82 71 71  BILITOT 0.9 0.9 0.9   US  Venous Img Lower Unilateral Right (DVT) Result Date: 08/29/2023 CLINICAL DATA:  Right lower  extremity edema EXAM: RIGHT LOWER EXTREMITY VENOUS DOPPLER ULTRASOUND TECHNIQUE: Gray-scale sonography with compression, as well as color and duplex ultrasound, were performed to evaluate the deep venous system(s) from the level of the common femoral vein through the popliteal and proximal calf veins. COMPARISON:  None Available. FINDINGS: VENOUS Normal compressibility of the common femoral, superficial femoral, and popliteal veins, as well as the visualized calf veins. Visualized portions of profunda femoral vein and great saphenous vein unremarkable. No filling defects to suggest DVT on grayscale or color Doppler imaging. Doppler waveforms show normal direction of venous flow, normal respiratory plasticity and response to augmentation. Limited views of the contralateral common femoral vein are unremarkable. OTHER None. Limitations: none IMPRESSION: Negative. Electronically Signed   By: Fernando Hoyer M.D.   On: 08/29/2023 14:16     Lab Results  Component Value Date   KPAFRELGTCHN 12.6 08/17/2023   KPAFRELGTCHN 490.9 (H) 07/03/2023   KPAFRELGTCHN 439.1 (H) 04/16/2023   LAMBDASER 3.6 (L) 08/17/2023   LAMBDASER 4.4 (L) 07/03/2023   LAMBDASER 4.8 (L) 04/16/2023   KAPLAMBRATIO 3.50 (H) 08/17/2023   KAPLAMBRATIO 111.57 (H) 07/03/2023   KAPLAMBRATIO 91.48 (H) 04/16/2023     Multiple myeloma in relapse (HCC) # LIGHT  CHAIN MONOCLONAL GAMMOPATHY -DEC 2024-[PCP-SPEP-NEGATIVE; Hb 13; GFR-> 60 ca-WN; However, random UPEP- positive for M protein/Bence-Jones- Protein positive; kappa type.  JAN 2025- SPEP- NEG; K/l= 91 [K=400]. FEB 2025- Bone marrow- BONE MARROW, ASPIRATE, CLOT, CORE:  -  Kappa restricted plasma cell neoplasm involving 50 to 90% of the  cellular marrow (patchy areas; average 70%)   cytogenetics: 11:14; 13 q- [standard risk] Negative Congo stain. MARCH 2025-PET Numerous lytic myelomatous bone lesions throughout the axial and appendicular skeleton noted on the CT scan without associated  discrete significant hypermetabolism.   S[facility]]. Discussed with Dr.Choi. TRANSPLANT ELIGIBLE. 4/25- Currently on Dara-RVD [q 21 d cycle- Griffin trial-   # Proceed with cycle number 2-day 8- Dara-D-Dex [ [q 21 d cycle- Griffin trial-; HOWEVER, HOLD Revlimid  sec to possibly causing the  rash- for the next 2 weeks; consider dose reduction vs- Pom at next cycle.   #  rash on the face- after 3 days after cycle #2 day-1 treatment- resolved with benadryl /and clairtin. Stopped for 3 days- resolved.  Recommend Singulair  today.  Held Revlimid -monitor rash on daratumumab .  # Multiple bone lesion-- [per pt s/p dental clearance] -Continue ca+Vit D BID. [Vit D 1000/d; 5000 q OD]. Monitor for now-follow-up Zometa given the allergic reaction to above meds-hold given ongoing hypocalcemia.  # History of a flutter [s/p ablation in [place] - clip of Left atrial appendage]-no anticoagulation- on asprin; irregular rhythm-2D echo pending.  Stable.  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   # DVT-ID Prophylaxis: asprin/acyclovir   # ACP:   # # IV access:PIV  # 1-4 cycles-SQ Dara-velcade  weekly x4 cycles; 5-6 dara-q2 w; Velade-weekly; Zometa-??   Pre-meds-? PS # DISPOSITION: # chemo today- give pre-meds as ordered.  # 1 weeks-  MD; labs- cbc/cmp; Chemo;  # 2  weeks- MD; ; labs- cbc/cmp; Chemo; possible zometa  # 3 weeks- MD  labs- cbc/cmp; Chemo; MM; K/L light chains;  Dr.B   All questions were answered. The patient knows to call the clinic with any problems, questions or concerns.    Gwyn Leos, MD 08/31/2023 10:08 AM

## 2023-09-01 ENCOUNTER — Encounter: Payer: Self-pay | Admitting: Internal Medicine

## 2023-09-05 NOTE — Telephone Encounter (Signed)
 Successful fax sent to Dr. Sullivan Endow requesting release of Pt in [facility].

## 2023-09-07 ENCOUNTER — Inpatient Hospital Stay: Admitting: Internal Medicine

## 2023-09-07 ENCOUNTER — Encounter: Payer: Self-pay | Admitting: Cardiology

## 2023-09-07 ENCOUNTER — Other Ambulatory Visit: Payer: Self-pay | Admitting: *Deleted

## 2023-09-07 ENCOUNTER — Inpatient Hospital Stay: Attending: Internal Medicine

## 2023-09-07 ENCOUNTER — Encounter: Payer: Self-pay | Admitting: Internal Medicine

## 2023-09-07 ENCOUNTER — Inpatient Hospital Stay

## 2023-09-07 VITALS — BP 112/74 | HR 95 | Temp 98.7°F | Resp 16 | Ht 71.0 in | Wt 242.7 lb

## 2023-09-07 VITALS — BP 109/76 | HR 82

## 2023-09-07 DIAGNOSIS — E559 Vitamin D deficiency, unspecified: Secondary | ICD-10-CM

## 2023-09-07 DIAGNOSIS — Z7982 Long term (current) use of aspirin: Secondary | ICD-10-CM | POA: Insufficient documentation

## 2023-09-07 DIAGNOSIS — I4892 Unspecified atrial flutter: Secondary | ICD-10-CM | POA: Diagnosis not present

## 2023-09-07 DIAGNOSIS — C9000 Multiple myeloma not having achieved remission: Secondary | ICD-10-CM

## 2023-09-07 DIAGNOSIS — N62 Hypertrophy of breast: Secondary | ICD-10-CM | POA: Insufficient documentation

## 2023-09-07 DIAGNOSIS — C9002 Multiple myeloma in relapse: Secondary | ICD-10-CM | POA: Diagnosis not present

## 2023-09-07 DIAGNOSIS — Z5112 Encounter for antineoplastic immunotherapy: Secondary | ICD-10-CM | POA: Diagnosis present

## 2023-09-07 DIAGNOSIS — R21 Rash and other nonspecific skin eruption: Secondary | ICD-10-CM | POA: Diagnosis not present

## 2023-09-07 LAB — CBC WITH DIFFERENTIAL (CANCER CENTER ONLY)
Abs Immature Granulocytes: 0.03 10*3/uL (ref 0.00–0.07)
Basophils Absolute: 0 10*3/uL (ref 0.0–0.1)
Basophils Relative: 0 %
Eosinophils Absolute: 0.2 10*3/uL (ref 0.0–0.5)
Eosinophils Relative: 2 %
HCT: 39.4 % (ref 39.0–52.0)
Hemoglobin: 13.1 g/dL (ref 13.0–17.0)
Immature Granulocytes: 0 %
Lymphocytes Relative: 17 %
Lymphs Abs: 1.4 10*3/uL (ref 0.7–4.0)
MCH: 30.5 pg (ref 26.0–34.0)
MCHC: 33.2 g/dL (ref 30.0–36.0)
MCV: 91.8 fL (ref 80.0–100.0)
Monocytes Absolute: 1 10*3/uL (ref 0.1–1.0)
Monocytes Relative: 13 %
Neutro Abs: 5.4 10*3/uL (ref 1.7–7.7)
Neutrophils Relative %: 68 %
Platelet Count: 186 10*3/uL (ref 150–400)
RBC: 4.29 MIL/uL (ref 4.22–5.81)
RDW: 14.8 % (ref 11.5–15.5)
WBC Count: 7.9 10*3/uL (ref 4.0–10.5)
nRBC: 0 % (ref 0.0–0.2)

## 2023-09-07 LAB — CMP (CANCER CENTER ONLY)
ALT: 23 U/L (ref 0–44)
AST: 16 U/L (ref 15–41)
Albumin: 3.1 g/dL — ABNORMAL LOW (ref 3.5–5.0)
Alkaline Phosphatase: 68 U/L (ref 38–126)
Anion gap: 6 (ref 5–15)
BUN: 16 mg/dL (ref 8–23)
CO2: 28 mmol/L (ref 22–32)
Calcium: 8.1 mg/dL — ABNORMAL LOW (ref 8.9–10.3)
Chloride: 102 mmol/L (ref 98–111)
Creatinine: 0.73 mg/dL (ref 0.61–1.24)
GFR, Estimated: >60 mL/min (ref >60)
Glucose, Bld: 109 mg/dL — ABNORMAL HIGH (ref 70–99)
Potassium: 4.5 mmol/L (ref 3.5–5.1)
Sodium: 136 mmol/L (ref 135–145)
Total Bilirubin: 0.7 mg/dL (ref 0.0–1.2)
Total Protein: 5.3 g/dL — ABNORMAL LOW (ref 6.5–8.1)

## 2023-09-07 MED ORDER — LENALIDOMIDE 20 MG PO CAPS: 20.0000 mg | ORAL_CAPSULE | Freq: Every day | ORAL | 0 refills | Status: DC

## 2023-09-07 MED ORDER — BORTEZOMIB CHEMO SQ INJECTION 3.5 MG (2.5MG/ML)
1.3000 mg/m2 | Freq: Once | INTRAMUSCULAR | Status: AC
  Administered 2023-09-07: 3 mg via SUBCUTANEOUS
  Filled 2023-09-07: qty 1.2

## 2023-09-07 MED ORDER — DIPHENHYDRAMINE HCL 25 MG PO CAPS: 25.0000 mg | ORAL_CAPSULE | Freq: Once | ORAL | Status: DC

## 2023-09-07 MED ORDER — DEXAMETHASONE 4 MG PO TABS: 20.0000 mg | ORAL_TABLET | Freq: Once | ORAL | Status: DC

## 2023-09-07 MED ORDER — MONTELUKAST SODIUM 10 MG PO TABS: 10.0000 mg | ORAL_TABLET | Freq: Every day | ORAL | Status: DC

## 2023-09-07 MED ORDER — DARATUMUMAB-HYALURONIDASE-FIHJ 1800-30000 MG-UT/15ML ~~LOC~~ SOLN
1800.0000 mg | Freq: Once | SUBCUTANEOUS | Status: AC
  Administered 2023-09-07: 1800 mg via SUBCUTANEOUS
  Filled 2023-09-07: qty 15

## 2023-09-07 MED ORDER — ACETAMINOPHEN 325 MG PO TABS: 650.0000 mg | ORAL_TABLET | Freq: Once | ORAL | Status: DC

## 2023-09-07 NOTE — Progress Notes (Signed)
 [facility] CONSULT NOTE  Patient Care Team: Tye Gall, MD as PCP - General (Internal Medicine) Boyce Byes, MD as PCP - Electrophysiology (Cardiology) Gwyn Leos, MD as Consulting Physician (Oncology)  CHIEF COMPLAINTS/PURPOSE OF CONSULTATION: Multiple Myeloma   Oncology History Overview Note  # LIGHT CHAIN MONOCLONAL GAMMOPATHY -DEC 2024-[PCP-SPEP-NEGATIVE; Hb 13; GFR-> 60 ca-WN; However, random UPEP- positive for M protein/Bence-Jones- Protein positive; kappa type.  JAN 2025- SPEP- NEG; K/l= 91 [K=400]. Dx= includes smoldering MM vs active MM. 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)   06/05/2023 Cancer Staging   Staging form: Plasma Cell Myeloma and Plasma Cell Disorders, AJCC 8th Edition - Clinical: Albumin (g/dL): 4, ISS: Stage II, High-risk cytogenetics: Absent, LDH: Normal - Signed by Gwyn Leos, MD on 06/05/2023 Albumin range (g/dL): Greater than or equal to 3.5 Cytogenetics: t(11;14) translocation   06/06/2023 Cancer Staging   Staging form: Plasma Cell Myeloma and Plasma Cell Disorders, AJCC 8th Edition - Clinical: RISS Stage I (Beta-2 -microglobulin (mg/L): 2.5, Albumin (g/dL): 4, ISS: Stage I, High-risk cytogenetics: Absent, LDH: Normal) - Signed by Gwyn Leos, MD on 06/06/2023 Stage prefix: Initial  diagnosis Beta 2 microglobulin range (mg/L): Less than 3.5 Albumin range (g/dL): Greater than or equal to 3.5 Cytogenetics: t(11;14) translocation   07/27/2023 -  Chemotherapy   Patient is on Treatment Plan : MYELOMA NEWLY DIAGNOSED TRANSPLANT CANDIDATE DaraVRd (Daratumumab  SQ) with weekly bortezomib  (D1,8,15) q21d x 6 Cycles (Induction/Consolidation)      HISTORY OF PRESENTING ILLNESS: Patient ambulating-independently. Accompanied by wife.   Jimmy [name] 68 y.o.  male with A.fib [watchman device; not on anti-coagulation] Multiple Myeloma [dx: march 2025] with multiple bone lesions-is here to proceed with with DARA-RVD [name] Rev- last cycle sec to rash]- chemotherapy is here for a follow-up.   Noted to rash resolved. NO swelling.  No falls.   Review of Systems  Constitutional:  Negative for chills, diaphoresis, fever and weight loss.  HENT:  Negative for nosebleeds and sore throat.   Eyes:  Negative for double vision.  Respiratory:  Negative for cough, hemoptysis, sputum production, shortness of breath and wheezing.   Cardiovascular:  Negative for chest pain, palpitations, orthopnea and leg swelling.  Gastrointestinal:  Negative for abdominal pain, blood in stool, constipation, diarrhea, heartburn, melena, nausea and vomiting.  Genitourinary:  Negative for dysuria, frequency and urgency.  Musculoskeletal:  Negative for back pain and joint pain.  Skin: Negative.  Negative for itching and rash.  Neurological:  Negative for dizziness, tingling, focal weakness, weakness and headaches.  Endo/Heme/Allergies:  Does not bruise/bleed easily.  Psychiatric/Behavioral:  Negative for depression. The patient is not nervous/anxious and does not have insomnia.     MEDICAL HISTORY:  Past Medical History:  Diagnosis Date   Atrial fibrillation (HCC)    had loop recorder, PAcs after DCCV 09/2019   Atrial flutter (HCC)    Fatigue  HLD (hyperlipidemia)    On amiodarone therapy 09/15/2019   Pain  of right lower leg 01/02/2022    SURGICAL HISTORY: Past Surgical History:  Procedure Laterality Date   afib surgical ablation  2021   CARDIOVERSION     2021 for Afib in [place]   CHOLECYSTECTOMY     GALLBLADDER SURGERY  2006   IR BONE MARROW BIOPSY & ASPIRATION  05/18/2023   MINIMALLY INVASIVE MAZE PROCEDURE     done in [place]  Dr R. Sullivan Endow   stye Right 09/2022    SOCIAL HISTORY: Social History   Socioeconomic History   Marital status: Married    Spouse name: Not on file   Number of children: Not on file   Years of education: Not on file   Highest education level: Bachelor's degree (e.g., BA, AB, BS)  Occupational History   Not on file  Tobacco Use   Smoking status: Never   Smokeless tobacco: Never  Vaping Use   Vaping status: Never Used  Substance and Sexual Activity   Alcohol use: Never   Drug use: Not Currently   Sexual activity: Not Currently  Other Topics Concern   Not on file  Social History Narrative   2 sons and 1 daughter    Married    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    No guns, wears seat belt, safe in relationship   Vegan x 8-9 years as of 07/06/20    Social Drivers of Corporate investment banker Strain: Low Risk  (04/16/2023)   Overall Financial Resource Strain (CARDIA)    Difficulty of Paying Living Expenses: Not hard at all  Food Insecurity: No Food Insecurity (04/16/2023)   Hunger Vital Sign    Worried About Running Out of Food in the Last Year: Never true    Ran Out of Food in the Last Year: Never true  Transportation Needs: No Transportation Needs (04/16/2023)   PRAPARE - Administrator, Civil Service (Medical): No    Lack of Transportation (Non-Medical): No  Physical Activity: Unknown (04/16/2023)   Exercise Vital Sign    Days of Exercise per Week: 0 days    Minutes of Exercise per Session: Not on file  Stress: No Stress Concern Present (04/16/2023)   [employer] of Occupational Health - Occupational Stress  Questionnaire    Feeling of Stress : Not at all  Social Connections: Moderately Integrated (04/16/2023)   Social Connection and Isolation Panel [NHANES]    Frequency of Communication with Friends and Family: Twice a week    Frequency of Social Gatherings with Friends and Family: Twice a week    Attends Religious Services: 1 to 4 times per year    Active Member of [employer] or Organizations: No    Attends Banker Meetings: Never    Marital Status: Married  Catering manager Violence: Not At Risk (04/16/2023)   Humiliation, Afraid, Rape, and Kick questionnaire    Fear of Current or Ex-Partner: No    Emotionally Abused: No    Physically Abused: No    Sexually Abused: No    FAMILY HISTORY: Family History  Problem Relation Age of Onset   COPD Mother    Atrial fibrillation Mother        ? due to Afib died 33   Cancer Father        lung cancer smoker age 56    Heart disease Father  bypass at 53    Atrial fibrillation Sister     ALLERGIES:  is allergic to revlimid  [facility] ].  MEDICATIONS:  Current Outpatient Medications  Medication Sig Dispense Refill   acetaminophen  (TYLENOL ) 325 MG tablet Take 650 mg by mouth once a week. Take 1 hour prior to infusion appointment.     acyclovir  (ZOVIRAX ) 400 MG tablet Take 1 tablet (400 mg total) by mouth 2 (two) times daily. 180 tablet 1   ASPIRIN 81 PO Take 81 mg by mouth daily.     cyanocobalamin  1000 MCG tablet Take 2,000 mcg by mouth.     dexamethasone  (DECADRON ) 4 MG tablet Take 5 tabs (20mg ) by mouth 1 hour prior to weekly infusion appointment and take 5 tabs (20mg ) the day after weekly infusion appointment. Take with with food . 120 tablet 0   diphenhydrAMINE  (BENADRYL ) 50 MG capsule Take 50 mg by mouth once a week. Take 1 hour prior to infusion appointment.     hydrOXYzine  (ATARAX ) 10 MG tablet Take 1 tablet (10 mg total) by mouth 3 (three) times daily as needed. 30 tablet 0   montelukast  (SINGULAIR ) 10 MG tablet Take  daily; and on days of infusion- Take 1 hour prior to infusion appointment. 90 tablet 1   Multiple Vitamin (CALCIUM COMPLEX PO) Take by mouth daily.     Multiple Vitamins-Minerals (MULTIVITAMIN WOMEN PO) Take by mouth daily.     Omega-3 1400 MG CAPS      ondansetron  (ZOFRAN ) 8 MG tablet One pill every 8 hours as needed for nausea/vomitting. 40 tablet 1   prochlorperazine  (COMPAZINE ) 10 MG tablet Take 1 tablet (10 mg total) by mouth every 6 (six) hours as needed for nausea or vomiting. 40 tablet 1   Turmeric (CURCUMIN 95) 500 MG CAPS Take 500 mg by mouth 2 (two) times daily.     Vitamin D -Vitamin K (VITAMIN K2-VITAMIN D3 PO) Take by mouth daily.     lenalidomide  (REVLIMID ) 20 MG capsule Take 1 capsule (20 mg total) by mouth daily. Take for 14 days, then hold for 7 days. Repeat every 21 days. [[facility]] 14 capsule 0   No current facility-administered medications for this visit.   Facility-Administered Medications Ordered in Other Visits  Medication Dose Route Frequency Provider Last Rate Last Admin   acetaminophen  (TYLENOL ) tablet 650 mg  650 mg Oral Once Apolinar Bero R, MD       bortezomib  SQ (VELCADE ) chemo injection (2.5mg /mL concentration) 3 mg  1.3 mg/m2 (Treatment Plan Recorded) Subcutaneous Once Nelda Balsam, NP       daratumumab -hyaluronidase -fihj (DARZALEX  FASPRO) 1800-30000 MG-UT/15ML chemo SQ injection 1,800 mg  1,800 mg Subcutaneous Once Allen, Lauren G, NP       dexamethasone  (DECADRON ) tablet 20 mg  20 mg Oral Once Mariella Blackwelder R, MD       montelukast  (SINGULAIR ) tablet 10 mg  10 mg Oral QHS Johntae Broxterman R, MD       PHYSICAL EXAMINATION:   Vitals:   09/07/23 0818  BP: 112/74  Pulse: 95  Resp: 16  Temp: 98.7 F (37.1 C)  SpO2: 99%     Filed Weights   09/07/23 0818  Weight: 242 lb 11.2 oz (110.1 kg)     Positive for bilateral gynecomastia.   Facial/torso rash- see below  Physical Exam Vitals and nursing note reviewed.  HENT:     Head:  Normocephalic and atraumatic.     Mouth/Throat:     Pharynx: Oropharynx is clear.  Eyes:  Extraocular Movements: Extraocular movements intact.     Pupils: Pupils are equal, round, and reactive to light.  Cardiovascular:     Rate and Rhythm: Normal rate and regular rhythm.  Abdominal:     Palpations: Abdomen is soft.  Musculoskeletal:        General: Normal range of motion.     Cervical back: Normal range of motion.  Skin:    General: Skin is warm.  Neurological:     General: No focal deficit present.     Mental Status: He is alert and oriented to person, place, and time.  Psychiatric:        Behavior: Behavior normal.        Judgment: Judgment normal.        LABORATORY DATA:  I have reviewed the data as listed Lab Results  Component Value Date   WBC 7.9 09/07/2023   HGB 13.1 09/07/2023   HCT 39.4 09/07/2023   MCV 91.8 09/07/2023   PLT 186 09/07/2023   Recent Labs    08/24/23 1014 08/31/23 0840 09/07/23 0818  NA 138 135 136  K 4.4 4.4 4.5  CL 101 103 102  CO2 27 26 28   GLUCOSE 119* 90 109*  BUN 13 10 16   CREATININE 0.79 0.93 0.73  CALCIUM 8.1* 7.8* 8.1*  GFRNONAA >60 >60 >60  PROT 5.2* 5.4* 5.3*  ALBUMIN 3.1* 3.0* 3.1*  AST 19 14* 16  ALT 38 31 23  ALKPHOS 71 71 68  BILITOT 0.9 0.9 0.7   US  Venous Img Lower Unilateral Right (DVT) Result Date: 08/29/2023 CLINICAL DATA:  Right lower extremity edema EXAM: RIGHT LOWER EXTREMITY VENOUS DOPPLER ULTRASOUND TECHNIQUE: Gray-scale sonography with compression, as well as color and duplex ultrasound, were performed to evaluate the deep venous system(s) from the level of the common femoral vein through the popliteal and proximal calf veins. COMPARISON:  None Available. FINDINGS: VENOUS Normal compressibility of the common femoral, superficial femoral, and popliteal veins, as well as the visualized calf veins. Visualized portions of profunda femoral vein and great saphenous vein unremarkable. No filling defects to  suggest DVT on grayscale or color Doppler imaging. Doppler waveforms show normal direction of venous flow, normal respiratory plasticity and response to augmentation. Limited views of the contralateral common femoral vein are unremarkable. OTHER None. Limitations: none IMPRESSION: Negative. Electronically Signed   By: Fernando Hoyer M.D.   On: 08/29/2023 14:16     Lab Results  Component Value Date   KPAFRELGTCHN 12.6 08/17/2023   KPAFRELGTCHN 490.9 (H) 07/03/2023   KPAFRELGTCHN 439.1 (H) 04/16/2023   LAMBDASER 3.6 (L) 08/17/2023   LAMBDASER 4.4 (L) 07/03/2023   LAMBDASER 4.8 (L) 04/16/2023   KAPLAMBRATIO 3.50 (H) 08/17/2023   KAPLAMBRATIO 111.57 (H) 07/03/2023   KAPLAMBRATIO 91.48 (H) 04/16/2023     Multiple myeloma in relapse (HCC) # LIGHT CHAIN MONOCLONAL GAMMOPATHY -DEC 2024-[PCP-SPEP-NEGATIVE; Hb 13; GFR-> 60 ca-WN; However, random UPEP- positive for M protein/Bence-Jones- Protein positive; kappa type.  JAN 2025- SPEP- NEG; K/l= 91 [K=400]. FEB 2025- Bone marrow- BONE MARROW, ASPIRATE, CLOT, CORE:  -  Kappa restricted plasma cell neoplasm involving 50 to 90% of the  cellular marrow (patchy areas; average 70%)   cytogenetics: 11:14; 13 q- [standard risk] Negative Congo stain. MARCH 2025-PET Numerous lytic myelomatous bone lesions throughout the axial and appendicular skeleton noted on the CT scan without associated discrete significant hypermetabolism.   S/P evaluation with Dr.Choi [facility]- bone marrow evaluation]. Discussed with Dr.Choi. TRANSPLANT ELIGIBLE. 4/25- Currently on Dara-RVD [  q 21 d cycle- [facility] trial-   # Proceed with cycle number 2-day 15- Dara-D-Dex [ [q 21 d cycle- [facility] trial-; HOWEVER, in /cycle #3 day-1-/1 week RE-START Revlimid  at 20 mg [dose reduced sec to possibly causing the  rash].   #  rash on the face- after 3 days after cycle #2 day-1 treatment- resolved with benadryl /and clairtin. Stopped for 3 days- resolved.  Recommend Singulair  today.  Held  Revlimid -monitor rash on daratumumab .  # Multiple bone lesion-- [per pt s/p dental clearance] -Continue ca+Vit D BID. [Vit D 1000/d; 5000 q OD]. Monitor for now-follow-up Zometa given the allergic reaction to above meds-hold given ongoing hypocalcemia.  # History of a flutter [s/p ablation in [place] - clip of Left atrial appendage]-no anticoagulation- on asprin; irregular rhythm-2D echo pending.  Stable.  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   # DVT-ID Prophylaxis: asprin/acyclovir   # ACP: had Advance directives.   # # IV access:PIV  # 1-4 cycles-SQ Dara-velcade  weekly x4 cycles; 5-6 dara-q2 w; Aisha Ali; Zometa-??   Pre-meds-? PS # DISPOSITION: # # chemo today # 1 weeks-  MD; labs- cbc/cmp; Chemo;  # 2  weeks- MD; ; labs- cbc/cmp; Chemo; possible zometa- MM; K/L light chains;  # 3 weeks- MD  labs- cbc/cmp; Chemo; vit D 25-OH levels   Dr.B  All questions were answered. The patient knows to call the clinic with any problems, questions or concerns.    Gwyn Leos, MD 09/07/2023 9:29 AM

## 2023-09-07 NOTE — Telephone Encounter (Signed)
 I called Dr. Gaylyn Keas office and they are going to give me a call back.

## 2023-09-07 NOTE — Assessment & Plan Note (Signed)
#   LIGHT CHAIN MONOCLONAL GAMMOPATHY -DEC 2024-[PCP-SPEP-NEGATIVE; Hb 13; GFR-> 60 ca-WN; However, random UPEP- positive for M protein/Bence-Jones- Protein positive; kappa type.  JAN 2025- SPEP- NEG; K/l= 91 [K=400]. FEB 2025- Bone marrow- BONE MARROW, ASPIRATE, CLOT, CORE:  -  Kappa restricted plasma cell neoplasm involving 50 to 90% of the  cellular marrow (patchy areas; average 70%)   cytogenetics: 11:14; 13 q- [standard risk] Negative Congo stain. MARCH 2025-PET Numerous lytic myelomatous bone lesions throughout the axial and appendicular skeleton noted on the CT scan without associated discrete significant hypermetabolism.   S/P evaluation with Dr.Choi [facility]- bone marrow evaluation]. Discussed with Dr.Choi. TRANSPLANT ELIGIBLE. 4/25- Currently on Dara-RVD [q 21 d cycle- [facility] trial-   # Proceed with cycle number 2-day 15- Dara-D-Dex [ [q 21 d cycle- [facility] trial-; HOWEVER, in /cycle #3 day-1-/1 week RE-START Revlimid  at 20 mg [dose reduced sec to possibly causing the  rash].   #  rash on the face- after 3 days after cycle #2 day-1 treatment- resolved with benadryl /and clairtin. Stopped for 3 days- resolved.  Recommend Singulair  today.  Held Revlimid -monitor rash on daratumumab .  # Multiple bone lesion-- [per pt s/p dental clearance] -Continue ca+Vit D BID. [Vit D 1000/d; 5000 q OD]. Monitor for now-follow-up Zometa given the allergic reaction to above meds-hold given ongoing hypocalcemia.  # History of a flutter [s/p ablation in [place] - clip of Left atrial appendage]-no anticoagulation- on asprin; irregular rhythm-2D echo pending.  Stable.  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   # DVT-ID Prophylaxis: asprin/acyclovir   # ACP: had Advance directives.   # # IV access:PIV  # 1-4 cycles-SQ Dara-velcade  weekly x4 cycles; 5-6 dara-q2 w; Aisha Ali; Zometa-??   Pre-meds-? PS # DISPOSITION: # # chemo today # 1 weeks-  MD; labs- cbc/cmp; Chemo;  # 2  weeks- MD; ;  labs- cbc/cmp; Chemo; possible zometa- MM; K/L light chains;  # 3 weeks- MD  labs- cbc/cmp; Chemo; vit D 25-OH levels   Dr.B

## 2023-09-07 NOTE — Patient Instructions (Signed)
 [facility] - A DEPT OF [facility]  Discharge Instructions: Thank you for choosing [facility] to provide your oncology and hematology care.  If you have a lab appointment with the Cancer Center, please go directly to the Cancer Center and check in at the registration area.  Wear comfortable clothing and clothing appropriate for easy access to any Portacath or PICC line.   We strive to give you quality time with your provider. You may need to reschedule your appointment if you arrive late (15 or more minutes).  Arriving late affects you and other patients whose appointments are after yours.  Also, if you miss three or more appointments without notifying the office, you may be dismissed from the clinic at the provider's discretion.      For prescription refill requests, have your pharmacy contact our office and allow 72 hours for refills to be completed.    Today you received the following chemotherapy and/or immunotherapy agents Darzalex  and Velcade .      To help prevent nausea and vomiting after your treatment, we encourage you to take your nausea medication as directed.  BELOW ARE SYMPTOMS THAT SHOULD BE REPORTED IMMEDIATELY: *FEVER GREATER THAN 100.4 F (38 C) OR HIGHER *CHILLS OR SWEATING *NAUSEA AND VOMITING THAT IS NOT CONTROLLED WITH YOUR NAUSEA MEDICATION *UNUSUAL SHORTNESS OF BREATH *UNUSUAL BRUISING OR BLEEDING *URINARY PROBLEMS (pain or burning when urinating, or frequent urination) *BOWEL PROBLEMS (unusual diarrhea, constipation, pain near the anus) TENDERNESS IN MOUTH AND THROAT WITH OR WITHOUT PRESENCE OF ULCERS (sore throat, sores in mouth, or a toothache) UNUSUAL RASH, SWELLING OR PAIN  UNUSUAL VAGINAL DISCHARGE OR ITCHING   Items with * indicate a potential emergency and should be followed up as soon as possible or go to the Emergency Department if any problems should occur.  Please show the CHEMOTHERAPY ALERT CARD or  IMMUNOTHERAPY ALERT CARD at check-in to the Emergency Department and triage nurse.  Should you have questions after your visit or need to cancel or reschedule your appointment, please contact [facility] - A DEPT OF [facility]  [phone] and follow the prompts.  Office hours are 8:00 a.m. to 4:30 p.m. Monday - Friday. Please note that voicemails left after 4:00 p.m. may not be returned until the following business day.  We are closed weekends and major holidays. You have access to a nurse at all times for urgent questions. Please call the main number to the clinic [phone] and follow the prompts.  For any non-urgent questions, you may also contact your provider using MyChart. We now offer e-Visits for anyone 60 and older to request care online for non-urgent symptoms. For details visit [email or web address].   Also download the MyChart app! Go to the app store, search "MyChart", open the app, select [facility] [place], and log in with your MyChart username and password.

## 2023-09-07 NOTE — Progress Notes (Signed)
 No concerns today

## 2023-09-10 ENCOUNTER — Ambulatory Visit (INDEPENDENT_AMBULATORY_CARE_PROVIDER_SITE_OTHER)

## 2023-09-10 DIAGNOSIS — I4819 Other persistent atrial fibrillation: Secondary | ICD-10-CM

## 2023-09-11 LAB — CUP PACEART REMOTE DEVICE CHECK
Date Time Interrogation Session: 20250609000311
Implantable Pulse Generator Implant Date: 20210518

## 2023-09-14 ENCOUNTER — Inpatient Hospital Stay

## 2023-09-14 ENCOUNTER — Inpatient Hospital Stay (HOSPITAL_BASED_OUTPATIENT_CLINIC_OR_DEPARTMENT_OTHER): Admitting: Internal Medicine

## 2023-09-14 ENCOUNTER — Encounter: Payer: Self-pay | Admitting: Internal Medicine

## 2023-09-14 VITALS — BP 107/77 | HR 85 | Temp 98.0°F | Resp 16 | Ht 71.0 in | Wt 243.8 lb

## 2023-09-14 VITALS — BP 116/70 | HR 80

## 2023-09-14 DIAGNOSIS — C9002 Multiple myeloma in relapse: Secondary | ICD-10-CM

## 2023-09-14 DIAGNOSIS — C9000 Multiple myeloma not having achieved remission: Secondary | ICD-10-CM

## 2023-09-14 DIAGNOSIS — Z5112 Encounter for antineoplastic immunotherapy: Secondary | ICD-10-CM | POA: Diagnosis not present

## 2023-09-14 LAB — CBC WITH DIFFERENTIAL (CANCER CENTER ONLY)
Abs Immature Granulocytes: 0.05 10*3/uL (ref 0.00–0.07)
Basophils Absolute: 0 10*3/uL (ref 0.0–0.1)
Basophils Relative: 0 %
Eosinophils Absolute: 0.1 10*3/uL (ref 0.0–0.5)
Eosinophils Relative: 1 %
HCT: 38.2 % — ABNORMAL LOW (ref 39.0–52.0)
Hemoglobin: 12.7 g/dL — ABNORMAL LOW (ref 13.0–17.0)
Immature Granulocytes: 1 %
Lymphocytes Relative: 20 %
Lymphs Abs: 1.3 10*3/uL (ref 0.7–4.0)
MCH: 30.6 pg (ref 26.0–34.0)
MCHC: 33.2 g/dL (ref 30.0–36.0)
MCV: 92 fL (ref 80.0–100.0)
Monocytes Absolute: 0.7 10*3/uL (ref 0.1–1.0)
Monocytes Relative: 10 %
Neutro Abs: 4.7 10*3/uL (ref 1.7–7.7)
Neutrophils Relative %: 68 %
Platelet Count: 148 10*3/uL — ABNORMAL LOW (ref 150–400)
RBC: 4.15 MIL/uL — ABNORMAL LOW (ref 4.22–5.81)
RDW: 15.3 % (ref 11.5–15.5)
WBC Count: 6.8 10*3/uL (ref 4.0–10.5)
nRBC: 0 % (ref 0.0–0.2)

## 2023-09-14 LAB — CMP (CANCER CENTER ONLY)
ALT: 22 U/L (ref 0–44)
AST: 17 U/L (ref 15–41)
Albumin: 3.3 g/dL — ABNORMAL LOW (ref 3.5–5.0)
Alkaline Phosphatase: 54 U/L (ref 38–126)
Anion gap: 7 (ref 5–15)
BUN: 13 mg/dL (ref 8–23)
CO2: 26 mmol/L (ref 22–32)
Calcium: 7.7 mg/dL — ABNORMAL LOW (ref 8.9–10.3)
Chloride: 103 mmol/L (ref 98–111)
Creatinine: 0.71 mg/dL (ref 0.61–1.24)
GFR, Estimated: >60 mL/min (ref >60)
Glucose, Bld: 97 mg/dL (ref 70–99)
Potassium: 4.2 mmol/L (ref 3.5–5.1)
Sodium: 136 mmol/L (ref 135–145)
Total Bilirubin: 0.9 mg/dL (ref 0.0–1.2)
Total Protein: 5.5 g/dL — ABNORMAL LOW (ref 6.5–8.1)

## 2023-09-14 MED ORDER — ACETAMINOPHEN 325 MG PO TABS: 650.0000 mg | ORAL_TABLET | Freq: Once | ORAL | Status: DC

## 2023-09-14 MED ORDER — BORTEZOMIB CHEMO SQ INJECTION 3.5 MG (2.5MG/ML)
1.3000 mg/m2 | Freq: Once | INTRAMUSCULAR | Status: AC
  Administered 2023-09-14: 3 mg via SUBCUTANEOUS
  Filled 2023-09-14: qty 1.2

## 2023-09-14 MED ORDER — DEXAMETHASONE 4 MG PO TABS: 20.0000 mg | ORAL_TABLET | Freq: Once | ORAL | Status: DC

## 2023-09-14 MED ORDER — DARATUMUMAB-HYALURONIDASE-FIHJ 1800-30000 MG-UT/15ML ~~LOC~~ SOLN
1800.0000 mg | Freq: Once | SUBCUTANEOUS | Status: AC
  Administered 2023-09-14: 1800 mg via SUBCUTANEOUS
  Filled 2023-09-14: qty 15

## 2023-09-14 MED ORDER — MONTELUKAST SODIUM 10 MG PO TABS: 10.0000 mg | ORAL_TABLET | Freq: Every day | ORAL | Status: DC

## 2023-09-14 MED ORDER — DIPHENHYDRAMINE HCL 25 MG PO CAPS: 25.0000 mg | ORAL_CAPSULE | Freq: Once | ORAL | Status: DC

## 2023-09-14 NOTE — Assessment & Plan Note (Addendum)
#   LIGHT CHAIN MONOCLONAL GAMMOPATHY -DEC 2024-[PCP-SPEP-NEGATIVE; Hb 13; GFR-> 60 ca-WN; However, random UPEP- positive for M protein/Bence-Jones- Protein positive; kappa type.  JAN 2025- SPEP- NEG; K/l= 91 [K=400]. FEB 2025- Bone marrow- BONE MARROW, ASPIRATE, CLOT, CORE:  -  Kappa restricted plasma cell neoplasm involving 50 to 90% of the  cellular marrow (patchy areas; average 70%)   cytogenetics: 11:14; 13 q- [standard risk] Negative Congo stain. MARCH 2025-PET Numerous lytic myelomatous bone lesions throughout the axial and appendicular skeleton noted on the CT scan without associated discrete significant hypermetabolism.   S/P evaluation with Dr.Choi [facility]- bone marrow evaluation]. Discussed with Dr.Choi. TRANSPLANT ELIGIBLE. 4/25- Currently on Dara-RVD [q 21 d cycle- [facility] trial-   # Proceed with cycle number 3-day 1- Dara-D-Dex [ [q 21 d cycle- [facility] trial-; HOWEVER, in /cycle #3 day-1-/1 week RE-START Revlimid  at 20 mg [dose reduced sec to possibly causing the  rash].   #  rash on the face- after 3 days after cycle #2 day-1 treatment- resolved with benadryl /and clairtin. Stopped for 3 days- resolved.  Recommend Singulair  today.  Held Revlimid -monitor rash on daratumumab .  # Multiple bone lesion-- [per pt s/p dental clearance] -Continue ca+Vit D BID. [Vit D 1000/d; 5000 q OD]. Monitor for now-follow-up Zometa given the allergic reaction to above meds-hold given ongoing hypocalcemia.  # History of a flutter [s/p ablation in [place] - clip of Left atrial appendage]-no anticoagulation- on asprin; irregular rhythm-2D echo pending.  Stable.  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  # DVT-ID Prophylaxis: asprin/acyclovir -  Stable.  # ACP: had Advance directives.   # # IV access:PIV  # 1-4 cycles-SQ Dara-velcade  weekly x4 cycles; 5-6 dara-q2 w; Aisha Ali; Zometa-??   Pre-meds-? PS- July 4th # DISPOSITION: # # chemo today # 1 weeks-  MD; labs- cbc/cmp; Chemo;   possible zometa- MM; K/L light chains;  # 2  weeks- MD; ; labs- cbc/cmp; Chemo; vit D 25-OH levels   # in 4 weeks- - MD  labs- cbc/cmp; Chemo;  # 5 weeks-  MD; labs- cbc/cmp; Chemo;  possible zometa- MM; K/L light chains;  # 6  weeks- MD; ; labs- cbc/cmp; Chemo; Dr.B

## 2023-09-14 NOTE — Patient Instructions (Signed)
 [facility] - A DEPT OF [facility]  Discharge Instructions: Thank you for choosing [facility] to provide your oncology and hematology care.  If you have a lab appointment with the Cancer Center, please go directly to the Cancer Center and check in at the registration area.  Wear comfortable clothing and clothing appropriate for easy access to any Portacath or PICC line.   We strive to give you quality time with your provider. You may need to reschedule your appointment if you arrive late (15 or more minutes).  Arriving late affects you and other patients whose appointments are after yours.  Also, if you miss three or more appointments without notifying the office, you may be dismissed from the clinic at the provider's discretion.      For prescription refill requests, have your pharmacy contact our office and allow 72 hours for refills to be completed.    Today you received the following chemotherapy and/or immunotherapy agents Darzalex  and Velcade .      To help prevent nausea and vomiting after your treatment, we encourage you to take your nausea medication as directed.  BELOW ARE SYMPTOMS THAT SHOULD BE REPORTED IMMEDIATELY: *FEVER GREATER THAN 100.4 F (38 C) OR HIGHER *CHILLS OR SWEATING *NAUSEA AND VOMITING THAT IS NOT CONTROLLED WITH YOUR NAUSEA MEDICATION *UNUSUAL SHORTNESS OF BREATH *UNUSUAL BRUISING OR BLEEDING *URINARY PROBLEMS (pain or burning when urinating, or frequent urination) *BOWEL PROBLEMS (unusual diarrhea, constipation, pain near the anus) TENDERNESS IN MOUTH AND THROAT WITH OR WITHOUT PRESENCE OF ULCERS (sore throat, sores in mouth, or a toothache) UNUSUAL RASH, SWELLING OR PAIN  UNUSUAL VAGINAL DISCHARGE OR ITCHING   Items with * indicate a potential emergency and should be followed up as soon as possible or go to the Emergency Department if any problems should occur.  Please show the CHEMOTHERAPY ALERT CARD or  IMMUNOTHERAPY ALERT CARD at check-in to the Emergency Department and triage nurse.  Should you have questions after your visit or need to cancel or reschedule your appointment, please contact [facility] - A DEPT OF [facility]  [phone] and follow the prompts.  Office hours are 8:00 a.m. to 4:30 p.m. Monday - Friday. Please note that voicemails left after 4:00 p.m. may not be returned until the following business day.  We are closed weekends and major holidays. You have access to a nurse at all times for urgent questions. Please call the main number to the clinic [phone] and follow the prompts.  For any non-urgent questions, you may also contact your provider using MyChart. We now offer e-Visits for anyone 60 and older to request care online for non-urgent symptoms. For details visit [email or web address].   Also download the MyChart app! Go to the app store, search "MyChart", open the app, select [facility] [place], and log in with your MyChart username and password.

## 2023-09-14 NOTE — Progress Notes (Signed)
 I connected with Jimmy [name] on 09/14/23 at  8:45 AM EDT by video enabled telemedicine visit and verified that I am speaking with the correct person using two identifiers.  I discussed the limitations, risks, security and privacy concerns of performing an evaluation and management service by telemedicine and the availability of in-person appointments. I also discussed with the patient that there may be a patient responsible charge related to this service. The patient expressed understanding and agreed to proceed.    Other persons participating in the visit and their role in the encounter: RN/medical reconciliation Patient's location: office Provider's location: home  Oncology History Overview Note  # LIGHT CHAIN MONOCLONAL GAMMOPATHY -DEC 2024-[PCP-SPEP-NEGATIVE; Hb 13; GFR-> 60 ca-WN; However, random UPEP- positive for M protein/Bence-Jones- Protein positive; kappa type.  JAN 2025- SPEP- NEG; K/l= 91 [K=400]. Dx= includes smoldering MM vs active MM. 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)   06/05/2023 Cancer Staging   Staging form: Plasma Cell Myeloma and Plasma Cell Disorders, AJCC 8th Edition - Clinical: Albumin (g/dL): 4, ISS: Stage II, High-risk cytogenetics: Absent, LDH: Normal - Signed by Gwyn Leos, MD on 06/05/2023 Albumin range (g/dL): Greater than or equal to 3.5 Cytogenetics: t(11;14) translocation   06/06/2023 Cancer Staging   Staging form: Plasma Cell Myeloma and Plasma Cell Disorders, AJCC 8th Edition - Clinical: RISS Stage I (Beta-2 -microglobulin (mg/L): 2.5, Albumin (g/dL): 4, ISS: Stage I, High-risk cytogenetics: Absent, LDH: Normal) - Signed by Gwyn Leos, MD on 06/06/2023 Stage prefix: Initial diagnosis Beta 2 microglobulin range (mg/L): Less than 3.5 Albumin range (g/dL): Greater than or equal to 3.5 Cytogenetics: t(11;14) translocation   07/27/2023 -  Chemotherapy   Patient is on Treatment Plan : MYELOMA NEWLY DIAGNOSED TRANSPLANT CANDIDATE DaraVRd (Daratumumab  SQ) with weekly bortezomib  (D1,8,15) q21d x 6 Cycles (Induction/Consolidation)        Chief Complaint: Multiple rash    History of present illness:Jimmy [name] 68 y.o.  male with history of multiple myeloma A-fib-Watchman device not on anticoagulation on Dara RVD is here for follow-up.  Patient admits to mild neuropathy bilateral extremities P denies any worsening leg swelling.  Denies any shortness of breath or cough.  No fever no chills.  Noted to gain weight.  Denies any rash.  Observation/objective: Alert & oriented x 3. In No acute distress.   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  JAN 2025- SPEP- NEG; K/l= 91 [K=400]. FEB 2025- Bone marrow- BONE MARROW, ASPIRATE, CLOT, CORE:  -  Kappa restricted plasma cell neoplasm involving 50 to 90% of the  cellular marrow (patchy areas; average 70%)   cytogenetics: 11:14; 13 q- [standard risk] Negative Congo stain. MARCH 2025-PET Numerous lytic myelomatous bone lesions  throughout the axial and appendicular skeleton noted on the CT scan without associated  discrete significant hypermetabolism.   S/P evaluation with Dr.Choi [facility]- bone marrow evaluation]. Discussed with Dr.Choi. TRANSPLANT ELIGIBLE. 4/25- Currently on Dara-RVD [q 21 d cycle- Griffin trial-   # Proceed with cycle number 3-day 1- Dara-D-Dex [ [q 21 d cycle- Griffin trial-; HOWEVER, in /cycle #3 day-1-/1 week RE-START Revlimid  at 20 mg [dose reduced sec to possibly causing the  rash].   #  rash on the face- after 3 days after cycle #2 day-1 treatment- resolved with benadryl /and clairtin. Stopped for 3 days- resolved.  Recommend Singulair  today.  Held Revlimid -monitor rash on daratumumab .  # Multiple bone lesion-- [per pt s/p dental clearance] -Continue ca+Vit D BID. [Vit D 1000/d; 5000 q OD]. Monitor for now-follow-up Zometa given the allergic reaction to above meds-hold given ongoing hypocalcemia.  # History of a flutter [s/p ablation in [place] - clip of Left atrial appendage]-no anticoagulation- on asprin; irregular rhythm-2D echo pending.  Stable.  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  # DVT-ID Prophylaxis: asprin/acyclovir -  Stable.  # ACP: had Advance directives.   # # IV access:PIV  # 1-4 cycles-SQ Dara-velcade  weekly x4 cycles; 5-6 dara-q2 w; Aisha Ali; Zometa-??   Pre-meds-? PS- July 4th # DISPOSITION: # # chemo today # 1 weeks-  MD; labs- cbc/cmp; Chemo;  possible zometa- MM; K/L light chains;  # 2  weeks- MD; ; labs- cbc/cmp; Chemo; vit D 25-OH levels   # in 4 weeks- - MD  labs- cbc/cmp; Chemo;  # 5 weeks-  MD; labs- cbc/cmp; Chemo;  possible zometa- MM; K/L light chains;  # 6  weeks- MD; ; labs- cbc/cmp; Chemo; Dr.B    Follow-up instructions:  I discussed the assessment and treatment plan with the patient.  The patient was provided an opportunity to ask questions and all were answered.  The patient agreed with the plan and demonstrated understanding of instructions.  The patient was advised to call back or seek an in person  evaluation if the symptoms worsen or if the condition fails to improve as anticipated.    Dr. Hedda Liv [facility] at [facility] 09/14/2023 9:24 AM

## 2023-09-14 NOTE — Progress Notes (Signed)
 C/o more neuropathy in both feet.

## 2023-09-15 ENCOUNTER — Ambulatory Visit: Payer: Self-pay | Admitting: Cardiology

## 2023-09-21 ENCOUNTER — Inpatient Hospital Stay

## 2023-09-21 ENCOUNTER — Encounter: Payer: Self-pay | Admitting: Nurse Practitioner

## 2023-09-21 ENCOUNTER — Inpatient Hospital Stay (HOSPITAL_BASED_OUTPATIENT_CLINIC_OR_DEPARTMENT_OTHER): Admitting: Nurse Practitioner

## 2023-09-21 VITALS — BP 115/75 | HR 83 | Temp 98.5°F | Resp 18 | Ht 71.0 in | Wt 243.0 lb

## 2023-09-21 VITALS — BP 105/72 | HR 91

## 2023-09-21 DIAGNOSIS — C9002 Multiple myeloma in relapse: Secondary | ICD-10-CM

## 2023-09-21 DIAGNOSIS — Z5111 Encounter for antineoplastic chemotherapy: Secondary | ICD-10-CM

## 2023-09-21 DIAGNOSIS — Z5112 Encounter for antineoplastic immunotherapy: Secondary | ICD-10-CM | POA: Diagnosis not present

## 2023-09-21 LAB — CMP (CANCER CENTER ONLY)
ALT: 27 U/L (ref 0–44)
AST: 17 U/L (ref 15–41)
Albumin: 3.6 g/dL (ref 3.5–5.0)
Alkaline Phosphatase: 52 U/L (ref 38–126)
Anion gap: 9 (ref 5–15)
BUN: 11 mg/dL (ref 8–23)
CO2: 27 mmol/L (ref 22–32)
Calcium: 8.4 mg/dL — ABNORMAL LOW (ref 8.9–10.3)
Chloride: 100 mmol/L (ref 98–111)
Creatinine: 0.83 mg/dL (ref 0.61–1.24)
GFR, Estimated: >60 mL/min (ref >60)
Glucose, Bld: 86 mg/dL (ref 70–99)
Potassium: 4.5 mmol/L (ref 3.5–5.1)
Sodium: 136 mmol/L (ref 135–145)
Total Bilirubin: 1.2 mg/dL (ref 0.0–1.2)
Total Protein: 5.8 g/dL — ABNORMAL LOW (ref 6.5–8.1)

## 2023-09-21 LAB — CBC WITH DIFFERENTIAL (CANCER CENTER ONLY)
Abs Immature Granulocytes: 0.04 10*3/uL (ref 0.00–0.07)
Basophils Absolute: 0 10*3/uL (ref 0.0–0.1)
Basophils Relative: 0 %
Eosinophils Absolute: 0.1 10*3/uL (ref 0.0–0.5)
Eosinophils Relative: 2 %
HCT: 39.1 % (ref 39.0–52.0)
Hemoglobin: 13 g/dL (ref 13.0–17.0)
Immature Granulocytes: 1 %
Lymphocytes Relative: 12 %
Lymphs Abs: 0.9 10*3/uL (ref 0.7–4.0)
MCH: 30.4 pg (ref 26.0–34.0)
MCHC: 33.2 g/dL (ref 30.0–36.0)
MCV: 91.4 fL (ref 80.0–100.0)
Monocytes Absolute: 0.5 10*3/uL (ref 0.1–1.0)
Monocytes Relative: 6 %
Neutro Abs: 6.2 10*3/uL (ref 1.7–7.7)
Neutrophils Relative %: 79 %
Platelet Count: 148 10*3/uL — ABNORMAL LOW (ref 150–400)
RBC: 4.28 MIL/uL (ref 4.22–5.81)
RDW: 15.3 % (ref 11.5–15.5)
WBC Count: 7.8 10*3/uL (ref 4.0–10.5)
nRBC: 0 % (ref 0.0–0.2)

## 2023-09-21 MED ORDER — MONTELUKAST SODIUM 10 MG PO TABS: 10.0000 mg | ORAL_TABLET | Freq: Once | ORAL | Status: DC

## 2023-09-21 MED ORDER — DEXAMETHASONE 4 MG PO TABS: 20.0000 mg | ORAL_TABLET | Freq: Once | ORAL | Status: DC

## 2023-09-21 MED ORDER — ACETAMINOPHEN 325 MG PO TABS: 650.0000 mg | ORAL_TABLET | Freq: Once | ORAL | Status: DC

## 2023-09-21 MED ORDER — DIPHENHYDRAMINE HCL 25 MG PO CAPS: 25.0000 mg | ORAL_CAPSULE | Freq: Once | ORAL | Status: DC

## 2023-09-21 MED ORDER — BORTEZOMIB CHEMO SQ INJECTION 3.5 MG (2.5MG/ML)
1.3000 mg/m2 | Freq: Once | INTRAMUSCULAR | Status: AC
  Administered 2023-09-21: 3 mg via SUBCUTANEOUS
  Filled 2023-09-21: qty 1.2

## 2023-09-21 MED ORDER — DARATUMUMAB-HYALURONIDASE-FIHJ 1800-30000 MG-UT/15ML ~~LOC~~ SOLN
1800.0000 mg | Freq: Once | SUBCUTANEOUS | Status: AC
  Administered 2023-09-21: 1800 mg via SUBCUTANEOUS
  Filled 2023-09-21: qty 15

## 2023-09-21 NOTE — Progress Notes (Signed)
 Patient is on his third try of the Revlimid , which he feels like this go round with the medication isn't having such a negative impact on him, he says that he is still a little bit itchy, but not like he was on his first try with the Revlimid .

## 2023-09-21 NOTE — Progress Notes (Signed)
 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  JAN 2025- SPEP- NEG; K/l= 91 [K=400]. Dx= includes smoldering MM vs active MM. 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)   06/05/2023 Cancer Staging   Staging form: Plasma Cell Myeloma and Plasma Cell Disorders, AJCC 8th Edition - Clinical: Albumin (g/dL): 4, ISS: Stage II, High-risk cytogenetics: Absent, LDH: Normal - Signed by Gwyn Leos, MD on 06/05/2023 Albumin range (g/dL): Greater than or equal to 3.5 Cytogenetics: t(11;14) translocation   06/06/2023 Cancer Staging   Staging form: Plasma Cell Myeloma and Plasma Cell Disorders, AJCC 8th Edition - Clinical: RISS Stage I (Beta-2 -microglobulin (mg/L): 2.5, Albumin (g/dL): 4, ISS: Stage I, High-risk cytogenetics: Absent, LDH: Normal) - Signed by  Gwyn Leos, MD on 06/06/2023 Stage prefix: Initial diagnosis Beta 2 microglobulin range (mg/L): Less than 3.5 Albumin range (g/dL): Greater than or equal to 3.5 Cytogenetics: t(11;14) translocation   07/27/2023 -  Chemotherapy   Patient is on Treatment Plan : MYELOMA NEWLY DIAGNOSED TRANSPLANT CANDIDATE DaraVRd (Daratumumab  SQ) with weekly bortezomib  (D1,8,15) q21d x 6 Cycles (Induction/Consolidation)      HISTORY OF PRESENTING ILLNESS: Patient ambulating-independently. Accompanied by wife.   Jimmy [name] 68 y.o. male with A.fib [watchman device; not on anti-coagulation] Multiple Myeloma [dx: march 2025] with multiple bone lesions-is here to proceed with with DARA-RVD Jimmy [name] Rev- last cycle sec to rash]- who returns to clinic for consideration of chemotherapy. He has restarted the revlidmid and tolerating better. Face is reddened but less itchy and swollen. No falls.   Review of Systems  Constitutional:  Negative for chills, diaphoresis, fever and weight loss.  HENT:  Negative for nosebleeds and sore throat.   Eyes:  Negative for double vision.  Respiratory:  Negative for cough, hemoptysis, sputum production, shortness of breath and wheezing.   Cardiovascular:  Negative for chest pain, palpitations, orthopnea and leg swelling.  Gastrointestinal:  Negative for abdominal pain, blood in stool, constipation, diarrhea, heartburn, melena, nausea and vomiting.  Genitourinary:  Negative for dysuria, frequency and urgency.  Musculoskeletal:  Negative for back pain and joint pain.  Skin: Negative.  Negative for itching and rash.  Neurological:  Negative for dizziness, tingling, focal weakness, weakness and headaches.  Endo/Heme/Allergies:  Does not bruise/bleed easily.  Psychiatric/Behavioral:  Negative for depression. The patient is not nervous/anxious and does not have insomnia.     MEDICAL HISTORY:  Past Medical History:  Diagnosis Date   Atrial fibrillation (HCC)  had loop  recorder, PAcs after DCCV 09/2019   Atrial flutter (HCC)    Fatigue    HLD (hyperlipidemia)    On amiodarone therapy 09/15/2019   Pain of right lower leg 01/02/2022    SURGICAL HISTORY: Past Surgical History:  Procedure Laterality Date   afib surgical ablation  2021   CARDIOVERSION     2021 for Afib in [place]   CHOLECYSTECTOMY     GALLBLADDER SURGERY  2006   IR BONE MARROW BIOPSY & ASPIRATION  05/18/2023   MINIMALLY INVASIVE MAZE PROCEDURE     done in [place]  Dr R. Sullivan Endow   stye Right 09/2022    SOCIAL HISTORY: Social History   Socioeconomic History   Marital status: Married    Spouse name: Not on file   Number of children: Not on file   Years of education: Not on file   Highest education level: Bachelor's degree (e.g., BA, AB, BS)  Occupational History   Not on file  Tobacco Use   Smoking status: Never   Smokeless tobacco: Never  Vaping Use   Vaping status: Never Used  Substance and Sexual Activity   Alcohol use: Never   Drug use: Not Currently   Sexual activity: Not Currently  Other Topics Concern   Not on file  Social History Narrative   2 sons and 1 daughter    Married    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    No guns, wears seat belt, safe in relationship   Vegan x 8-9 years as of 07/06/20    Social Drivers of Corporate investment banker Strain: Low Risk  (04/16/2023)   Overall Financial Resource Strain (CARDIA)    Difficulty of Paying Living Expenses: Not hard at all  Food Insecurity: No Food Insecurity (04/16/2023)   Hunger Vital Sign    Worried About Running Out of Food in the Last Year: Never true    Ran Out of Food in the Last Year: Never true  Transportation Needs: No Transportation Needs (04/16/2023)   PRAPARE - Administrator, Civil Service (Medical): No    Lack of Transportation (Non-Medical): No  Physical Activity: Unknown (04/16/2023)   Exercise Vital Sign    Days of Exercise per Week: 0 days    Minutes of Exercise per Session:  Not on file  Stress: No Stress Concern Present (04/16/2023)   [name]    Feeling of Stress : Not at all  Social Connections: Moderately Integrated (04/16/2023)   Social Connection and Isolation Panel    Frequency of Communication with Friends and Family: Twice a week    Frequency of Social Gatherings with Friends and Family: Twice a week    Attends Religious Services: 1 to 4 times per year    Active Member of [employer] or Organizations: No    Attends Banker Meetings: Never    Marital Status: Married  Catering manager Violence: Not At Risk (04/16/2023)   Humiliation, Afraid, Rape, and Kick questionnaire    Fear of Current or Ex-Partner: No    Emotionally Abused: No    Physically Abused: No    Sexually Abused: No    FAMILY HISTORY: Family History  Problem Relation Age of Onset   COPD Mother    Atrial fibrillation Mother        ? due to Afib died 51   Cancer Father  lung cancer smoker age 25    Heart disease Father        bypass at 66    Atrial fibrillation Sister     ALLERGIES:  is allergic to revlimid  [lenalidomide ].  MEDICATIONS:  Current Outpatient Medications  Medication Sig Dispense Refill   acetaminophen  (TYLENOL ) 325 MG tablet Take 650 mg by mouth once a week. Take 1 hour prior to infusion appointment.     acyclovir  (ZOVIRAX ) 400 MG tablet Take 1 tablet (400 mg total) by mouth 2 (two) times daily. 180 tablet 1   ASPIRIN 81 PO Take 81 mg by mouth daily.     cyanocobalamin  1000 MCG tablet Take 2,000 mcg by mouth.     dexamethasone  (DECADRON ) 4 MG tablet Take 5 tabs (20mg ) by mouth 1 hour prior to weekly infusion appointment and take 5 tabs (20mg ) the day after weekly infusion appointment. Take with with food . 120 tablet 0   diphenhydrAMINE  (BENADRYL ) 50 MG capsule Take 50 mg by mouth once a week. Take 1 hour prior to infusion appointment.     hydrOXYzine  (ATARAX ) 10 MG tablet Take 1  tablet (10 mg total) by mouth 3 (three) times daily as needed. 30 tablet 0   lenalidomide  (REVLIMID ) 20 MG capsule Take 1 capsule (20 mg total) by mouth daily. Take for 14 days, then hold for 7 days. Repeat every 21 days. [[facility]] 14 capsule 0   montelukast  (SINGULAIR ) 10 MG tablet Take daily; and on days of infusion- Take 1 hour prior to infusion appointment. 90 tablet 1   Multiple Vitamin (CALCIUM COMPLEX PO) Take by mouth daily.     Multiple Vitamins-Minerals (MULTIVITAMIN WOMEN PO) Take by mouth daily. (Patient not taking: Reported on 09/14/2023)     Omega-3 1400 MG CAPS      ondansetron  (ZOFRAN ) 8 MG tablet One pill every 8 hours as needed for nausea/vomitting. 40 tablet 1   prochlorperazine  (COMPAZINE ) 10 MG tablet Take 1 tablet (10 mg total) by mouth every 6 (six) hours as needed for nausea or vomiting. 40 tablet 1   Turmeric (CURCUMIN 95) 500 MG CAPS Take 500 mg by mouth 2 (two) times daily.     Vitamin D -Vitamin K (VITAMIN K2-VITAMIN D3 PO) Take by mouth daily.     No current facility-administered medications for this visit.   PHYSICAL EXAMINATION: Vitals:   09/21/23 0849  BP: 115/75  Pulse: 83  Resp: 18  Temp: 98.5 F (36.9 C)  SpO2: 99%   Filed Weights   09/21/23 0849  Weight: 243 lb (110.2 kg)   Positive for bilateral gynecomastia.   Facial/torso rash- see below  Physical Exam Vitals and nursing note reviewed.  HENT:     Head: Normocephalic and atraumatic.     Mouth/Throat:     Pharynx: Oropharynx is clear.   Eyes:     Extraocular Movements: Extraocular movements intact.     Pupils: Pupils are equal, round, and reactive to light.    Cardiovascular:     Rate and Rhythm: Normal rate and regular rhythm.  Abdominal:     Palpations: Abdomen is soft.   Musculoskeletal:        General: Normal range of motion.     Cervical back: Normal range of motion.   Skin:    General: Skin is warm.   Neurological:     General: No focal deficit present.     Mental  Status: He is alert and oriented to person, place, and time.   Psychiatric:  Behavior: Behavior normal.        Judgment: Judgment normal.    LABORATORY DATA:  I have reviewed the data as listed Lab Results  Component Value Date   WBC 7.8 09/21/2023   HGB 13.0 09/21/2023   HCT 39.1 09/21/2023   MCV 91.4 09/21/2023   PLT 148 (L) 09/21/2023   Recent Labs    09/07/23 0818 09/14/23 0828 09/21/23 0811  NA 136 136 136  K 4.5 4.2 4.5  CL 102 103 100  CO2 28 26 27   GLUCOSE 109* 97 86  BUN 16 13 11   CREATININE 0.73 0.71 0.83  CALCIUM 8.1* 7.7* 8.4*  GFRNONAA >60 >60 >60  PROT 5.3* 5.5* 5.8*  ALBUMIN 3.1* 3.3* 3.6  AST 16 17 17   ALT 23 22 27   ALKPHOS 68 54 52  BILITOT 0.7 0.9 1.2   CUP PACEART REMOTE DEVICE CHECK Result Date: 09/11/2023 ILR summary report received. Battery status OK. Normal device function. No new symptom, tachy, brady, or pause episodes. Known persistent AF, controlled rates, ASA only, left atrial appendage clipped.  Monthly summary reports and ROV/PRN LA, CVRS  US  Venous Img Lower Unilateral Right (DVT) Result Date: 08/29/2023 CLINICAL DATA:  Right lower extremity edema EXAM: RIGHT LOWER EXTREMITY VENOUS DOPPLER ULTRASOUND TECHNIQUE: Gray-scale sonography with compression, as well as color and duplex ultrasound, were performed to evaluate the deep venous system(s) from the level of the common femoral vein through the popliteal and proximal calf veins. COMPARISON:  None Available. FINDINGS: VENOUS Normal compressibility of the common femoral, superficial femoral, and popliteal veins, as well as the visualized calf veins. Visualized portions of profunda femoral vein and great saphenous vein unremarkable. No filling defects to suggest DVT on grayscale or color Doppler imaging. Doppler waveforms show normal direction of venous flow, normal respiratory plasticity and response to augmentation. Limited views of the contralateral common femoral vein are unremarkable.  OTHER None. Limitations: none IMPRESSION: Negative. Electronically Signed   By: Fernando Hoyer M.D.   On: 08/29/2023 14:16     Lab Results  Component Value Date   KPAFRELGTCHN 12.6 08/17/2023   KPAFRELGTCHN 490.9 (H) 07/03/2023   KPAFRELGTCHN 439.1 (H) 04/16/2023   LAMBDASER 3.6 (L) 08/17/2023   LAMBDASER 4.4 (L) 07/03/2023   LAMBDASER 4.8 (L) 04/16/2023   KAPLAMBRATIO 3.50 (H) 08/17/2023   KAPLAMBRATIO 111.57 (H) 07/03/2023   KAPLAMBRATIO 91.48 (H) 04/16/2023     Multiple myeloma in relapse (HCC) # LIGHT CHAIN MONOCLONAL GAMMOPATHY -DEC 2024-[PCP-SPEP-NEGATIVE; Hb 13; GFR-> 60 ca-WN; However, random UPEP- positive for M protein/Bence-Jones- Protein positive; kappa type.  JAN 2025- SPEP- NEG; K/l= 91 [K=400]. FEB 2025- Bone marrow- BONE MARROW, ASPIRATE, CLOT, CORE:  -  Kappa restricted plasma cell neoplasm involving 50 to 90% of the  cellular marrow (patchy areas; average 70%)   cytogenetics: 11:14; 13 q- [standard risk] Negative Congo stain. MARCH 2025-PET Numerous lytic myelomatous bone lesions throughout the axial and appendicular skeleton noted on the CT scan without associated discrete significant hypermetabolism.   S/P evaluation with Dr.Choi [facility]- bone marrow evaluation]. Discussed with Dr.Choi. TRANSPLANT ELIGIBLE. 4/25- Currently on Dara-RVD [q 21 d cycle- [facility] trial-    # s/p D1C3 - Dara-D-Dex [ [q 21 d cycle- Griffin trial. However, C3D1- restarted revlimid  20 mg (dose reduced d/t rash). .    #  rash on the face- after 3 days after cycle #2 day-1 treatment- resolved with benadryl /and clairtin. Stopped for 3 days- resolved.  Recommend Singulair  today.  Held Revlimid -monitor rash on daratumumab .  Now on revlimid , dose reduced. Improved rash/redness. Labs reviewed and acceptablef or chemo. Proceed with dara-velcade  today.    # Multiple bone lesion-- [per pt s/p dental clearance] -Continue ca+Vit D BID. [Vit D 1000/d; 5000 q OD]. Monitor for now-follow-up Zometa given the  allergic reaction to above meds-hold given ongoing hypocalcemia.   # History of a flutter [s/p ablation in [place] - clip of Left atrial appendage]-no anticoagulation- on asprin; irregular rhythm-2D echo pending.  Stable.   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   # DVT-ID Prophylaxis: asprin/acyclovir -  Stable.   # ACP: had Advance directives.    # # IV access:PIV   # 1-4 cycles-SQ Dara-velcade  weekly x4 cycles; 5-6 dara-q2 w; Velade-weekly; Zometa- on hold d/t calcium. Increase to 1200 mg dailyl. Cont vit d.    Pre-meds-?  PS- July 4th  # DISPOSITION: Chemo today F/u as scheduled- la  No problem-specific Assessment & Plan notes found for this encounter.  All questions were answered. The patient knows to call the clinic with any problems, questions or concerns.   Nelda Balsam, NP 09/21/2023

## 2023-09-24 LAB — MULTIPLE MYELOMA PANEL, SERUM
Albumin SerPl Elph-Mcnc: 3 g/dL (ref 2.9–4.4)
Albumin/Glob SerPl: 1.4 (ref 0.7–1.7)
Alpha 1: 0.2 g/dL (ref 0.0–0.4)
Alpha2 Glob SerPl Elph-Mcnc: 0.8 g/dL (ref 0.4–1.0)
B-Globulin SerPl Elph-Mcnc: 0.9 g/dL (ref 0.7–1.3)
Gamma Glob SerPl Elph-Mcnc: 0.3 g/dL — ABNORMAL LOW (ref 0.4–1.8)
Globulin, Total: 2.2 g/dL (ref 2.2–3.9)
IgA: 17 mg/dL — ABNORMAL LOW (ref 61–437)
IgG (Immunoglobin G), Serum: 351 mg/dL — ABNORMAL LOW (ref 603–1613)
IgM (Immunoglobulin M), Srm: 13 mg/dL — ABNORMAL LOW (ref 20–172)
M Protein SerPl Elph-Mcnc: 0.1 g/dL — ABNORMAL HIGH
Total Protein ELP: 5.2 g/dL — ABNORMAL LOW (ref 6.0–8.5)

## 2023-09-24 LAB — KAPPA/LAMBDA LIGHT CHAINS
Kappa free light chain: 5.6 mg/L (ref 3.3–19.4)
Kappa, lambda light chain ratio: 1.51 (ref 0.26–1.65)
Lambda free light chains: 3.7 mg/L — ABNORMAL LOW (ref 5.7–26.3)

## 2023-09-28 ENCOUNTER — Inpatient Hospital Stay

## 2023-09-28 ENCOUNTER — Encounter: Payer: Self-pay | Admitting: Internal Medicine

## 2023-09-28 ENCOUNTER — Inpatient Hospital Stay (HOSPITAL_BASED_OUTPATIENT_CLINIC_OR_DEPARTMENT_OTHER): Admitting: Internal Medicine

## 2023-09-28 VITALS — BP 116/64 | HR 83 | Temp 98.6°F | Resp 18 | Ht 71.0 in | Wt 242.0 lb

## 2023-09-28 DIAGNOSIS — C9002 Multiple myeloma in relapse: Secondary | ICD-10-CM | POA: Diagnosis not present

## 2023-09-28 DIAGNOSIS — C9000 Multiple myeloma not having achieved remission: Secondary | ICD-10-CM

## 2023-09-28 DIAGNOSIS — E559 Vitamin D deficiency, unspecified: Secondary | ICD-10-CM

## 2023-09-28 DIAGNOSIS — Z5112 Encounter for antineoplastic immunotherapy: Secondary | ICD-10-CM | POA: Diagnosis not present

## 2023-09-28 LAB — CMP (CANCER CENTER ONLY)
ALT: 22 U/L (ref 0–44)
AST: 20 U/L (ref 15–41)
Albumin: 3.5 g/dL (ref 3.5–5.0)
Alkaline Phosphatase: 51 U/L (ref 38–126)
Anion gap: 9 (ref 5–15)
BUN: 15 mg/dL (ref 8–23)
CO2: 24 mmol/L (ref 22–32)
Calcium: 8 mg/dL — ABNORMAL LOW (ref 8.9–10.3)
Chloride: 101 mmol/L (ref 98–111)
Creatinine: 0.76 mg/dL (ref 0.61–1.24)
GFR, Estimated: >60 mL/min (ref >60)
Glucose, Bld: 104 mg/dL — ABNORMAL HIGH (ref 70–99)
Potassium: 4 mmol/L (ref 3.5–5.1)
Sodium: 134 mmol/L — ABNORMAL LOW (ref 135–145)
Total Bilirubin: 1.1 mg/dL (ref 0.0–1.2)
Total Protein: 5.7 g/dL — ABNORMAL LOW (ref 6.5–8.1)

## 2023-09-28 LAB — CBC WITH DIFFERENTIAL (CANCER CENTER ONLY)
Abs Immature Granulocytes: 0.03 10*3/uL (ref 0.00–0.07)
Basophils Absolute: 0 10*3/uL (ref 0.0–0.1)
Basophils Relative: 0 %
Eosinophils Absolute: 0.2 10*3/uL (ref 0.0–0.5)
Eosinophils Relative: 3 %
HCT: 39.1 % (ref 39.0–52.0)
Hemoglobin: 13.2 g/dL (ref 13.0–17.0)
Immature Granulocytes: 0 %
Lymphocytes Relative: 13 %
Lymphs Abs: 0.9 10*3/uL (ref 0.7–4.0)
MCH: 30.6 pg (ref 26.0–34.0)
MCHC: 33.8 g/dL (ref 30.0–36.0)
MCV: 90.7 fL (ref 80.0–100.0)
Monocytes Absolute: 0.8 10*3/uL (ref 0.1–1.0)
Monocytes Relative: 12 %
Neutro Abs: 4.9 10*3/uL (ref 1.7–7.7)
Neutrophils Relative %: 72 %
Platelet Count: 129 10*3/uL — ABNORMAL LOW (ref 150–400)
RBC: 4.31 MIL/uL (ref 4.22–5.81)
RDW: 15.1 % (ref 11.5–15.5)
WBC Count: 6.8 10*3/uL (ref 4.0–10.5)
nRBC: 0 % (ref 0.0–0.2)

## 2023-09-28 LAB — VITAMIN D 25 HYDROXY (VIT D DEFICIENCY, FRACTURES): Vit D, 25-Hydroxy: 28.11 ng/mL — ABNORMAL LOW (ref 30–100)

## 2023-09-28 MED ORDER — DARATUMUMAB-HYALURONIDASE-FIHJ 1800-30000 MG-UT/15ML ~~LOC~~ SOLN
1800.0000 mg | Freq: Once | SUBCUTANEOUS | Status: AC
  Administered 2023-09-28: 1800 mg via SUBCUTANEOUS
  Filled 2023-09-28: qty 15

## 2023-09-28 MED ORDER — DEXAMETHASONE 4 MG PO TABS: 20.0000 mg | ORAL_TABLET | Freq: Once | ORAL | Status: DC

## 2023-09-28 MED ORDER — DIPHENHYDRAMINE HCL 25 MG PO CAPS: 25.0000 mg | ORAL_CAPSULE | Freq: Once | ORAL | Status: DC

## 2023-09-28 MED ORDER — MONTELUKAST SODIUM 10 MG PO TABS: 10.0000 mg | ORAL_TABLET | Freq: Once | ORAL | Status: DC

## 2023-09-28 MED ORDER — LENALIDOMIDE 20 MG PO CAPS
20.0000 mg | ORAL_CAPSULE | Freq: Every day | ORAL | 0 refills | Status: DC
Start: 2023-09-28 — End: 2023-10-24

## 2023-09-28 MED ORDER — ACETAMINOPHEN 325 MG PO TABS: 650.0000 mg | ORAL_TABLET | Freq: Once | ORAL | Status: DC

## 2023-09-28 MED ORDER — BORTEZOMIB CHEMO SQ INJECTION 3.5 MG (2.5MG/ML)
1.3000 mg/m2 | Freq: Once | INTRAMUSCULAR | Status: AC
  Administered 2023-09-28: 3 mg via SUBCUTANEOUS
  Filled 2023-09-28: qty 1.2

## 2023-09-28 MED ORDER — ERGOCALCIFEROL 1.25 MG (50000 UT) PO CAPS: 50000.0000 [IU] | ORAL_CAPSULE | ORAL | 1 refills | Status: AC

## 2023-09-28 NOTE — Progress Notes (Signed)
 Patient is still having the neuropathy in his feet, his ankles are a little bit swollen. He is having a little bit of constipation, which he is taking Mirilax for.

## 2023-09-28 NOTE — Assessment & Plan Note (Addendum)
#   LIGHT CHAIN MONOCLONAL GAMMOPATHY -DEC 2024-[PCP-SPEP-NEGATIVE; Hb 13; GFR-> 60 ca-WN; However, random UPEP- positive for M protein/Bence-Jones- Protein positive; kappa type.  JAN 2025- SPEP- NEG; K/l= 91 [K=400]. FEB 2025- Bone marrow- BONE MARROW, ASPIRATE, CLOT, CORE:  -  Kappa restricted plasma cell neoplasm involving 50 to 90% of the  cellular marrow (patchy areas; average 70%)   cytogenetics: 11:14; 13 q- [standard risk] Negative Congo stain. MARCH 2025-PET Numerous lytic myelomatous bone lesions throughout the axial and appendicular skeleton noted on the CT scan without associated discrete significant hypermetabolism.   S/P evaluation with Dr.Choi [facility]- bone marrow evaluation]. Discussed with Dr.Choi. TRANSPLANT ELIGIBLE. 4/25- Currently on Dara-RVD [q 21 d cycle- [facility] trial- awaiting [facility]- July 9th.   # Proceed with cycle number 3-day 15- Dara-Rev-Dex [ [q 21 d cycle- Griffin trial-; Revlimid  at 20 mg [dose reduced sec  rash].   #  rash on the face- improved  with rev- 20 mg. Monitor closely.   # Multiple bone lesion-- [per pt s/p dental clearance] -Continue ca+Vit D BID. [Vit D 1000/d; 5000 q OD]. Monitor for now-follow-up Zometa given the allergic reaction to above meds-hold given ongoing hypocalcemia.  # History of a flutter [s/p ablation in [place] - clip of Left atrial appendage]-no anticoagulation- on asprin; irregular rhythm-2D echo pending.  Stable.  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  # DVT-ID Prophylaxis: asprin/acyclovir -  Stable.  # ACP: had Advance directives.   # # IV access:PIV  # 1-4 cycles-SQ Dara-velcade  weekly x4 cycles; 5-6 dara-q2 w; [place]; Zometa-??   Pre-meds-? PS- July 4th # DISPOSITION: # q 4 weeks- MM; K/L light chains # chemo today; No zometa-  # 2 weeks-  MD; labs- cbc/cmp; Chemo;  # 3  weeks- MD; ; labs- cbc/cmp; Chemo # # in 4 weeks- - MD  labs- cbc/cmp; Chemo;- Dr.B  Addendum: vit D 28 [June 2025]-recommend  50,000 units vitamin D  once a week; and recommend calcium 1000 mg once a day- GB

## 2023-09-28 NOTE — Progress Notes (Signed)
 [facility] CONSULT NOTE  Patient Care Team: [name], MD as PCP - General (Internal Medicine) Cindie Ole DASEN, MD as PCP - Electrophysiology (Cardiology) Rennie Cindy SAUNDERS, MD as Consulting Physician (Oncology)  CHIEF COMPLAINTS/PURPOSE OF CONSULTATION: Multiple Myeloma   Oncology History Overview Note  # LIGHT CHAIN MONOCLONAL GAMMOPATHY -DEC 2024-[PCP-SPEP-NEGATIVE; Hb 13; GFR-> 60 ca-WN; However, random UPEP- positive for M protein/Bence-Jones- Protein positive; kappa type.  JAN 2025- SPEP- NEG; K/l= 91 [K=400]. Dx= includes smoldering MM vs active MM. 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)   06/05/2023 Cancer Staging   Staging form: Plasma Cell Myeloma and Plasma Cell Disorders, AJCC 8th Edition - Clinical: Albumin (g/dL): 4, ISS: Stage II, High-risk cytogenetics: Absent, LDH: Normal - Signed by Rennie Cindy SAUNDERS, MD on 06/05/2023 Albumin range (g/dL): Greater than or equal to 3.5 Cytogenetics: t(11;14) translocation   06/06/2023 Cancer Staging   Staging form: Plasma Cell Myeloma and Plasma Cell Disorders, AJCC 8th Edition - Clinical: RISS Stage I (Beta-2 -microglobulin (mg/L): 2.5, Albumin (g/dL): 4, ISS: Stage I, High-risk cytogenetics: Absent, LDH: Normal) - Signed by  Rennie Cindy SAUNDERS, MD on 06/06/2023 Stage prefix: Initial diagnosis Beta 2 microglobulin range (mg/L): Less than 3.5 Albumin range (g/dL): Greater than or equal to 3.5 Cytogenetics: t(11;14) translocation   07/27/2023 -  Chemotherapy   Patient is on Treatment Plan : MYELOMA NEWLY DIAGNOSED TRANSPLANT CANDIDATE DaraVRd (Daratumumab  SQ) with weekly bortezomib  (D1,8,15) q21d x 6 Cycles (Induction/Consolidation)      HISTORY OF PRESENTING ILLNESS: Patient ambulating-independently. Accompanied by wife.   Jimmy [name] 68 y.o.  male with A.fib [watchman device; not on anti-coagulation] Multiple Myeloma [dx: march 2025] with multiple bone lesions-is here to proceed with with DARA-RVD-  chemotherapy is here for a follow-up.   Patient is still having the neuropathy in his feet, his ankles are a little bit swollen. He is having a little bit of constipation, which he is taking Mirilax for   Noted to rash resolved. NO swelling.  No falls.   Review of Systems  Constitutional:  Negative for chills, diaphoresis, fever and weight loss.  HENT:  Negative for nosebleeds and sore throat.   Eyes:  Negative for double vision.  Respiratory:  Negative for cough, hemoptysis, sputum production, shortness of breath and wheezing.   Cardiovascular:  Negative for chest pain, palpitations, orthopnea and leg swelling.  Gastrointestinal:  Negative for abdominal pain, blood in stool, constipation, diarrhea, heartburn, melena, nausea and vomiting.  Genitourinary:  Negative for dysuria, frequency and urgency.  Musculoskeletal:  Negative for back pain and joint pain.  Skin: Negative.  Negative for itching and rash.  Neurological:  Negative for dizziness, tingling, focal weakness, weakness and headaches.  Endo/Heme/Allergies:  Does not bruise/bleed easily.  Psychiatric/Behavioral:  Negative for depression. The patient is not nervous/anxious and does not have insomnia.  MEDICAL HISTORY:  Past Medical History:   Diagnosis Date   Atrial fibrillation (HCC)    had loop recorder, PAcs after DCCV 09/2019   Atrial flutter (HCC)    Fatigue    HLD (hyperlipidemia)    On amiodarone therapy 09/15/2019   Pain of right lower leg 01/02/2022    SURGICAL HISTORY: Past Surgical History:  Procedure Laterality Date   afib surgical ablation  2021   CARDIOVERSION     2021 for Afib in [place]   CHOLECYSTECTOMY     GALLBLADDER SURGERY  2006   IR BONE MARROW BIOPSY & ASPIRATION  05/18/2023   MINIMALLY INVASIVE MAZE PROCEDURE     done in [place]  Dr JONELLE. Gala   stye Right 09/2022    SOCIAL HISTORY: Social History   Socioeconomic History   Marital status: Married    Spouse name: Not on file   Number of children: Not on file   Years of education: Not on file   Highest education level: Bachelor's degree (e.g., BA, AB, BS)  Occupational History   Not on file  Tobacco Use   Smoking status: Never   Smokeless tobacco: Never  Vaping Use   Vaping status: Never Used  Substance and Sexual Activity   Alcohol use: Never   Drug use: Not Currently   Sexual activity: Not Currently  Other Topics Concern   Not on file  Social History Narrative   2 sons and 1 daughter    Married    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    No guns, wears seat belt, safe in relationship   Vegan x 8-9 years as of 07/06/20    Social Drivers of Corporate investment banker Strain: Low Risk  (04/16/2023)   Overall Financial Resource Strain (CARDIA)    Difficulty of Paying Living Expenses: Not hard at all  Food Insecurity: No Food Insecurity (04/16/2023)   Hunger Vital Sign    Worried About Running Out of Food in the Last Year: Never true    Ran Out of Food in the Last Year: Never true  Transportation Needs: No Transportation Needs (04/16/2023)   PRAPARE - Administrator, Civil Service (Medical): No    Lack of Transportation (Non-Medical): No  Physical Activity: Unknown (04/16/2023)   Exercise Vital Sign    Days of  Exercise per Week: 0 days    Minutes of Exercise per Session: Not on file  Stress: No Stress Concern Present (04/16/2023)   [name]    Feeling of Stress : Not at all  Social Connections: Moderately Integrated (04/16/2023)   Social Connection and Isolation Panel    Frequency of Communication with Friends and Family: Twice a week    Frequency of Social Gatherings with Friends and Family: Twice a week    Attends Religious Services: 1 to 4 times per year    Active Member of [employer] or Organizations: No    Attends Banker Meetings: Never    Marital Status: Married  Catering manager Violence: Not At Risk (04/16/2023)   Humiliation, Afraid, Rape, and Kick questionnaire    Fear of Current or Ex-Partner: No    Emotionally Abused: No    Physically Abused: No    Sexually Abused: No    FAMILY HISTORY: Family History  Problem Relation Age of Onset   COPD Mother    Atrial fibrillation Mother        ?  due to Afib died 22   Cancer Father        lung cancer smoker age 32    Heart disease Father        bypass at 92    Atrial fibrillation Sister     ALLERGIES:  has no active allergies.  MEDICATIONS:  Current Outpatient Medications  Medication Sig Dispense Refill   acetaminophen  (TYLENOL ) 325 MG tablet Take 650 mg by mouth once a week. Take 1 hour prior to infusion appointment.     acyclovir  (ZOVIRAX ) 400 MG tablet Take 1 tablet (400 mg total) by mouth 2 (two) times daily. 180 tablet 1   ASPIRIN 81 PO Take 81 mg by mouth daily.     cyanocobalamin  1000 MCG tablet Take 2,000 mcg by mouth.     dexamethasone  (DECADRON ) 4 MG tablet Take 5 tabs (20mg ) by mouth 1 hour prior to weekly infusion appointment and take 5 tabs (20mg ) the day after weekly infusion appointment. Take with with food . 120 tablet 0   diphenhydrAMINE  (BENADRYL ) 50 MG capsule Take 50 mg by mouth once a week. Take 1 hour prior to infusion appointment.      ergocalciferol  (VITAMIN D2) 1.25 MG (50000 UT) capsule Take 1 capsule (50,000 Units total) by mouth once a week. 12 capsule 1   hydrOXYzine  (ATARAX ) 10 MG tablet Take 1 tablet (10 mg total) by mouth 3 (three) times daily as needed. 30 tablet 0   montelukast  (SINGULAIR ) 10 MG tablet Take daily; and on days of infusion- Take 1 hour prior to infusion appointment. 90 tablet 1   Multiple Vitamin (CALCIUM COMPLEX PO) Take by mouth daily.     Omega-3 1400 MG CAPS      ondansetron  (ZOFRAN ) 8 MG tablet One pill every 8 hours as needed for nausea/vomitting. 40 tablet 1   prochlorperazine  (COMPAZINE ) 10 MG tablet Take 1 tablet (10 mg total) by mouth every 6 (six) hours as needed for nausea or vomiting. 40 tablet 1   Turmeric (CURCUMIN 95) 500 MG CAPS Take 500 mg by mouth 2 (two) times daily.     Vitamin D -Vitamin K (VITAMIN K2-VITAMIN D3 PO) Take by mouth daily.     lenalidomide  (REVLIMID ) 20 MG capsule Take 1 capsule (20 mg total) by mouth daily. Take for 14 days, then hold for 7 days. Repeat every 21 days. [[facility]] 14 capsule 0   Multiple Vitamins-Minerals (MULTIVITAMIN WOMEN PO) Take by mouth daily. (Patient not taking: Reported on 09/28/2023)     No current facility-administered medications for this visit.   Facility-Administered Medications Ordered in Other Visits  Medication Dose Route Frequency Provider Last Rate Last Admin   acetaminophen  (TYLENOL ) tablet 650 mg  650 mg Oral Once Geanette Buonocore R, MD       dexamethasone  (DECADRON ) tablet 20 mg  20 mg Oral Once Talani Brazee R, MD       diphenhydrAMINE  (BENADRYL ) capsule 25 mg  25 mg Oral Once Saleah Rishel R, MD       montelukast  (SINGULAIR ) tablet 10 mg  10 mg Oral Once Kaio Kuhlman R, MD       PHYSICAL EXAMINATION:   Vitals:   09/28/23 0846  BP: 116/64  Pulse: 83  Resp: 18  Temp: 98.6 F (37 C)  SpO2: 99%     Filed Weights   09/28/23 0846  Weight: 242 lb (109.8 kg)     Positive for bilateral  gynecomastia.   Facial/torso rash- see below  Physical Exam Vitals and nursing  note reviewed.  HENT:     Head: Normocephalic and atraumatic.     Mouth/Throat:     Pharynx: Oropharynx is clear.   Eyes:     Extraocular Movements: Extraocular movements intact.     Pupils: Pupils are equal, round, and reactive to light.    Cardiovascular:     Rate and Rhythm: Normal rate and regular rhythm.  Abdominal:     Palpations: Abdomen is soft.   Musculoskeletal:        General: Normal range of motion.     Cervical back: Normal range of motion.   Skin:    General: Skin is warm.   Neurological:     General: No focal deficit present.     Mental Status: He is alert and oriented to person, place, and time.   Psychiatric:        Behavior: Behavior normal.        Judgment: Judgment normal.        LABORATORY DATA:  I have reviewed the data as listed Lab Results  Component Value Date   WBC 6.8 09/28/2023   HGB 13.2 09/28/2023   HCT 39.1 09/28/2023   MCV 90.7 09/28/2023   PLT 129 (L) 09/28/2023   Recent Labs    09/14/23 0828 09/21/23 0811 09/28/23 0840  NA 136 136 134*  K 4.2 4.5 4.0  CL 103 100 101  CO2 26 27 24   GLUCOSE 97 86 104*  BUN 13 11 15   CREATININE 0.71 0.83 0.76  CALCIUM 7.7* 8.4* 8.0*  GFRNONAA >60 >60 >60  PROT 5.5* 5.8* 5.7*  ALBUMIN 3.3* 3.6 3.5  AST 17 17 20   ALT 22 27 22   ALKPHOS 54 52 51  BILITOT 0.9 1.2 1.1   CUP PACEART REMOTE DEVICE CHECK Result Date: 09/11/2023 ILR summary report received. Battery status OK. Normal device function. No new symptom, tachy, brady, or pause episodes. Known persistent AF, controlled rates, ASA only, left atrial appendage clipped.  Monthly summary reports and ROV/PRN LA, CVRS    Lab Results  Component Value Date   KPAFRELGTCHN 5.6 09/21/2023   KPAFRELGTCHN 12.6 08/17/2023   KPAFRELGTCHN 490.9 (H) 07/03/2023   LAMBDASER 3.7 (L) 09/21/2023   LAMBDASER 3.6 (L) 08/17/2023   LAMBDASER 4.4 (L) 07/03/2023    KAPLAMBRATIO 1.51 09/21/2023   KAPLAMBRATIO 3.50 (H) 08/17/2023   KAPLAMBRATIO 111.57 (H) 07/03/2023     Multiple myeloma in relapse (HCC) # LIGHT CHAIN MONOCLONAL GAMMOPATHY -DEC 2024-[PCP-SPEP-NEGATIVE; Hb 13; GFR-> 60 ca-WN; However, random UPEP- positive for M protein/Bence-Jones- Protein positive; kappa type.  JAN 2025- SPEP- NEG; K/l= 91 [K=400]. FEB 2025- Bone marrow- BONE MARROW, ASPIRATE, CLOT, CORE:  -  Kappa restricted plasma cell neoplasm involving 50 to 90% of the  cellular marrow (patchy areas; average 70%)   cytogenetics: 11:14; 13 q- [standard risk] Negative Congo stain. MARCH 2025-PET Numerous lytic myelomatous bone lesions throughout the axial and appendicular skeleton noted on the CT scan without associated discrete significant hypermetabolism.   S/P evaluation with Dr.Choi [facility]- bone marrow evaluation]. Discussed with Dr.Choi. TRANSPLANT ELIGIBLE. 4/25- Currently on Dara-RVD [q 21 d cycle- Griffin trial- awaiting [facility]- July 9th.   # Proceed with cycle number 3-day 15- Dara-Rev-Dex [ [q 21 d cycle- Griffin trial-; Revlimid  at 20 mg [dose reduced sec  rash].   #  rash on the face- improved  with rev- 20 mg. Monitor closely.   # Multiple bone lesion-- [per pt s/p dental clearance] -Continue ca+Vit D BID. [Vit D 1000/d;  5000 q OD]. Monitor for now-follow-up Zometa given the allergic reaction to above meds-hold given ongoing hypocalcemia.  # History of a flutter [s/p ablation in [place] - clip of Left atrial appendage]-no anticoagulation- on asprin; irregular rhythm-2D echo pending.  Stable.  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  # DVT-ID Prophylaxis: asprin/acyclovir -  Stable.  # ACP: had Advance directives.   # # IV access:PIV  # 1-4 cycles-SQ Dara-velcade  weekly x4 cycles; 5-6 dara-q2 w; Lavern; Zometa-??   Pre-meds-? PS- July 4th # DISPOSITION: # q 4 weeks- MM; K/L light chains # chemo today; No zometa-  # 2 weeks-  MD; labs-  cbc/cmp; Chemo;  # 3  weeks- MD; ; labs- cbc/cmp; Chemo # # in 4 weeks- - MD  labs- cbc/cmp; Chemo;- Dr.B  Addendum: vit D 28 [June 2025]-recommend 50,000 units vitamin D  once a week; and recommend calcium 1000 mg once a day- GB  All questions were answered. The patient knows to call the clinic with any problems, questions or concerns.    Cindy JONELLE Joe, MD 09/28/2023 2:10 PM

## 2023-10-04 ENCOUNTER — Telehealth: Payer: Self-pay

## 2023-10-04 NOTE — Telephone Encounter (Signed)
 [facility] notified and they will list rash as a side effect and not allergy.

## 2023-10-04 NOTE — Telephone Encounter (Signed)
 [facility] calling to confirm pt is to continue Revlimid  with reported rash.

## 2023-10-11 ENCOUNTER — Ambulatory Visit (HOSPITAL_COMMUNITY)
Admission: RE | Admit: 2023-10-11 | Discharge: 2023-10-11 | Disposition: A | Discharge status: Home / Self Care | Source: Ambulatory Visit | Attending: Cardiology | Admitting: Cardiology

## 2023-10-11 ENCOUNTER — Ambulatory Visit

## 2023-10-11 DIAGNOSIS — I4819 Other persistent atrial fibrillation: Secondary | ICD-10-CM | POA: Diagnosis present

## 2023-10-11 LAB — CUP PACEART REMOTE DEVICE CHECK
Date Time Interrogation Session: 20250710000351
Implantable Pulse Generator Implant Date: 20210518

## 2023-10-11 LAB — ECHOCARDIOGRAM COMPLETE: S' Lateral: 3.32 cm

## 2023-10-12 ENCOUNTER — Inpatient Hospital Stay

## 2023-10-12 ENCOUNTER — Encounter: Payer: Self-pay | Admitting: Internal Medicine

## 2023-10-12 ENCOUNTER — Inpatient Hospital Stay: Attending: Internal Medicine

## 2023-10-12 ENCOUNTER — Inpatient Hospital Stay (HOSPITAL_BASED_OUTPATIENT_CLINIC_OR_DEPARTMENT_OTHER): Admitting: Internal Medicine

## 2023-10-12 VITALS — BP 111/61 | HR 84 | Temp 97.8°F | Resp 18 | Ht 71.0 in | Wt 238.0 lb

## 2023-10-12 DIAGNOSIS — Z7969 Long term (current) use of other immunomodulators and immunosuppressants: Secondary | ICD-10-CM | POA: Insufficient documentation

## 2023-10-12 DIAGNOSIS — Z79624 Long term (current) use of inhibitors of nucleotide synthesis: Secondary | ICD-10-CM | POA: Insufficient documentation

## 2023-10-12 DIAGNOSIS — C9000 Multiple myeloma not having achieved remission: Secondary | ICD-10-CM | POA: Diagnosis not present

## 2023-10-12 DIAGNOSIS — Z7961 Long term (current) use of immunomodulator: Secondary | ICD-10-CM | POA: Insufficient documentation

## 2023-10-12 DIAGNOSIS — Z7982 Long term (current) use of aspirin: Secondary | ICD-10-CM | POA: Diagnosis not present

## 2023-10-12 DIAGNOSIS — C9002 Multiple myeloma in relapse: Secondary | ICD-10-CM

## 2023-10-12 DIAGNOSIS — Z79899 Other long term (current) drug therapy: Secondary | ICD-10-CM | POA: Insufficient documentation

## 2023-10-12 DIAGNOSIS — Z5112 Encounter for antineoplastic immunotherapy: Secondary | ICD-10-CM | POA: Insufficient documentation

## 2023-10-12 LAB — CBC WITH DIFFERENTIAL (CANCER CENTER ONLY)
Abs Immature Granulocytes: 0.02 K/uL (ref 0.00–0.07)
Basophils Absolute: 0.1 K/uL (ref 0.0–0.1)
Basophils Relative: 2 %
Eosinophils Absolute: 0.1 K/uL (ref 0.0–0.5)
Eosinophils Relative: 1 %
HCT: 39.5 % (ref 39.0–52.0)
Hemoglobin: 13.2 g/dL (ref 13.0–17.0)
Immature Granulocytes: 0 %
Lymphocytes Relative: 13 %
Lymphs Abs: 0.9 K/uL (ref 0.7–4.0)
MCH: 30.3 pg (ref 26.0–34.0)
MCHC: 33.4 g/dL (ref 30.0–36.0)
MCV: 90.8 fL (ref 80.0–100.0)
Monocytes Absolute: 0.6 K/uL (ref 0.1–1.0)
Monocytes Relative: 9 %
Neutro Abs: 5.2 K/uL (ref 1.7–7.7)
Neutrophils Relative %: 75 %
Platelet Count: 324 K/uL (ref 150–400)
RBC: 4.35 MIL/uL (ref 4.22–5.81)
RDW: 15 % (ref 11.5–15.5)
WBC Count: 7 K/uL (ref 4.0–10.5)
nRBC: 0 % (ref 0.0–0.2)

## 2023-10-12 LAB — CMP (CANCER CENTER ONLY)
ALT: 16 U/L (ref 0–44)
AST: 18 U/L (ref 15–41)
Albumin: 3.5 g/dL (ref 3.5–5.0)
Alkaline Phosphatase: 54 U/L (ref 38–126)
Anion gap: 7 (ref 5–15)
BUN: 12 mg/dL (ref 8–23)
CO2: 24 mmol/L (ref 22–32)
Calcium: 8.7 mg/dL — ABNORMAL LOW (ref 8.9–10.3)
Chloride: 108 mmol/L (ref 98–111)
Creatinine: 0.88 mg/dL (ref 0.61–1.24)
GFR, Estimated: >60 mL/min (ref >60)
Glucose, Bld: 116 mg/dL — ABNORMAL HIGH (ref 70–99)
Potassium: 4 mmol/L (ref 3.5–5.1)
Sodium: 139 mmol/L (ref 135–145)
Total Bilirubin: 0.8 mg/dL (ref 0.0–1.2)
Total Protein: 5.9 g/dL — ABNORMAL LOW (ref 6.5–8.1)

## 2023-10-12 MED ORDER — BORTEZOMIB CHEMO SQ INJECTION 3.5 MG (2.5MG/ML)
1.3000 mg/m2 | Freq: Once | INTRAMUSCULAR | Status: AC
  Administered 2023-10-12: 3 mg via SUBCUTANEOUS
  Filled 2023-10-12: qty 1.2

## 2023-10-12 MED ORDER — MONTELUKAST SODIUM 10 MG PO TABS: 10.0000 mg | ORAL_TABLET | Freq: Once | ORAL | Status: DC

## 2023-10-12 MED ORDER — DIPHENHYDRAMINE HCL 25 MG PO CAPS
25.0000 mg | ORAL_CAPSULE | Freq: Once | ORAL | Status: DC
Start: 2023-10-12 — End: 2023-10-12

## 2023-10-12 MED ORDER — DEXAMETHASONE 4 MG PO TABS: ORAL_TABLET | ORAL | 0 refills | Status: DC

## 2023-10-12 MED ORDER — DARATUMUMAB-HYALURONIDASE-FIHJ 1800-30000 MG-UT/15ML ~~LOC~~ SOLN
1800.0000 mg | Freq: Once | SUBCUTANEOUS | Status: AC
  Administered 2023-10-12: 1800 mg via SUBCUTANEOUS
  Filled 2023-10-12: qty 15

## 2023-10-12 MED ORDER — DEXAMETHASONE 4 MG PO TABS: 20.0000 mg | ORAL_TABLET | Freq: Once | ORAL | Status: DC

## 2023-10-12 MED ORDER — ACETAMINOPHEN 325 MG PO TABS: 650.0000 mg | ORAL_TABLET | Freq: Once | ORAL | Status: DC

## 2023-10-12 NOTE — Progress Notes (Signed)
 Had an echo yesterday. Seen at [facility] for cell transplant consult. More tests coming up there for the transplant.  Pt has 6 weeks of dexamethasone , will need refill if going to stay on.  What is the injection/infusion sch for here?

## 2023-10-12 NOTE — Assessment & Plan Note (Addendum)
#   LIGHT CHAIN MONOCLONAL GAMMOPATHY -DEC 2024-[PCP-SPEP-NEGATIVE; Hb 13; GFR-> 60 ca-WN; However, random UPEP- positive for M protein/Bence-Jones- Protein positive; kappa type.  JAN 2025- SPEP- NEG; K/l= 91 [K=400]. FEB 2025- Bone marrow- BONE MARROW, ASPIRATE, CLOT, CORE:  -  Kappa restricted plasma cell neoplasm involving 50 to 90% of the  cellular marrow (patchy areas; average 70%)   cytogenetics: 11:14; 13 q- [standard risk] Negative Congo stain. MARCH 2025-PET Numerous lytic myelomatous bone lesions throughout the axial and appendicular skeleton noted on the CT scan without associated discrete significant hypermetabolism.   S/P evaluation with Dr.Choi [facility]- bone marrow evaluation]. Discussed with Dr.Choi. TRANSPLANT ELIGIBLE. 4/25- Currently on Dara-RVD [q 21 d cycle- Griffin trial- awaiting [facility]- July 9th.   # Proceed with cycle number 4-day 1- Dara-Rev-Dex [ [q 21 d cycle- Griffin trial-; Revlimid  at 20 mg [dose reduced sec  rash].  Awaiting further workup at [facility] the bone marrow biopsy/MRD testing.discussed with Dr.Choi.   #  rash on the face- improved  with rev- 20 mg. Monitor closely.   # Multiple bone lesion-- [per pt s/p dental clearance] -Continue ca+Vit D BID. [Vit D 1000/d; 5000 q OD]. Monitor for now-follow-up Zometa given the allergic reaction to above meds-hold given ongoing hypocalcemia. Addendum: vit D 28 [June 2025]-recommend 50,000 units vitamin D  once a week; and recommend calcium 1000 mg once a day- GB  # History of a flutter [s/p ablation in [place] - clip of Left atrial appendage]-no anticoagulation- on asprin; irregular rhythm-2D echo pending.  Stable.  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  # DVT-ID Prophylaxis: asprin/acyclovir -  Stable.  # ACP: had Advance directives.   # # IV access:PIV  # 1-4 cycles-SQ Dara-velcade  weekly x4 cycles; 5-6 dara-q2 w; Lavern; Zometa-??   MD appt- q 3W  Pre-meds-?PS  # DISPOSITION: # q 4 weeks-  MM; K/L light chains # chemo today;  # in 3 week-ADD Zometa-  # appts as per IS-  Dr.B

## 2023-10-12 NOTE — Progress Notes (Signed)
  Cancer Center CONSULT NOTE  Patient Care Team: [name], MD as PCP - General (Internal Medicine) Cindie Ole DASEN, MD as PCP - Electrophysiology (Cardiology) Rennie Cindy SAUNDERS, MD as Consulting Physician (Oncology)  CHIEF COMPLAINTS/PURPOSE OF CONSULTATION: Multiple Myeloma   Oncology History Overview Note  # LIGHT CHAIN MONOCLONAL GAMMOPATHY -DEC 2024-[PCP-SPEP-NEGATIVE; Hb 13; GFR-> 60 ca-WN; However, random UPEP- positive for M protein/Bence-Jones- Protein positive; kappa type.  JAN 2025- SPEP- NEG; K/l= 91 [K=400]. Dx= includes smoldering MM vs active MM. 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)   06/05/2023 Cancer Staging   Staging form: Plasma Cell Myeloma and Plasma Cell Disorders, AJCC 8th Edition - Clinical: Albumin (g/dL): 4, ISS: Stage II, High-risk cytogenetics: Absent, LDH: Normal - Signed by Rennie Cindy SAUNDERS, MD on 06/05/2023 Albumin range (g/dL): Greater than or equal to 3.5 Cytogenetics: t(11;14) translocation   06/06/2023 Cancer Staging   Staging form: Plasma Cell Myeloma and Plasma Cell Disorders, AJCC 8th Edition - Clinical: RISS Stage I (Beta-2 -microglobulin (mg/L): 2.5, Albumin (g/dL): 4, ISS: Stage I, High-risk cytogenetics: Absent, LDH: Normal) - Signed by  Rennie Cindy SAUNDERS, MD on 06/06/2023 Stage prefix: Initial diagnosis Beta 2 microglobulin range (mg/L): Less than 3.5 Albumin range (g/dL): Greater than or equal to 3.5 Cytogenetics: t(11;14) translocation   07/27/2023 -  Chemotherapy   Patient is on Treatment Plan : MYELOMA NEWLY DIAGNOSED TRANSPLANT CANDIDATE DaraVRd (Daratumumab  SQ) with weekly bortezomib  (D1,8,15) q21d x 6 Cycles (Induction/Consolidation)      HISTORY OF PRESENTING ILLNESS: Patient ambulating-independently. Accompanied by wife.   Jimmy [name] 68 y.o.  male with A.fib [watchman device; not on anti-coagulation] Multiple Myeloma [dx: march 2025] with multiple bone lesions-is here to proceed with with DARA-RVD-  chemotherapy is here for a follow-up.   Patient had an echo yesterday. Seen at [facility] for cell transplant consult. More tests coming up there for the transplant.   Patient denies any neuropathy in his feet. Noted to rash resolved. NO swelling.  No falls.   Review of Systems  Constitutional:  Negative for chills, diaphoresis, fever and weight loss.  HENT:  Negative for nosebleeds and sore throat.   Eyes:  Negative for double vision.  Respiratory:  Negative for cough, hemoptysis, sputum production, shortness of breath and wheezing.   Cardiovascular:  Negative for chest pain, palpitations, orthopnea and leg swelling.  Gastrointestinal:  Negative for abdominal pain, blood in stool, constipation, diarrhea, heartburn, melena, nausea and vomiting.  Genitourinary:  Negative for dysuria, frequency and urgency.  Musculoskeletal:  Negative for back pain and joint pain.  Skin: Negative.  Negative for itching and rash.  Neurological:  Negative for dizziness, tingling, focal weakness, weakness and headaches.  Endo/Heme/Allergies:  Does not bruise/bleed easily.  Psychiatric/Behavioral:  Negative for depression. The patient is not nervous/anxious and does not have insomnia.     MEDICAL HISTORY:  Past Medical History:   Diagnosis Date   Atrial fibrillation (HCC)    had loop recorder, PAcs after DCCV 09/2019   Atrial flutter (HCC)    Fatigue    HLD (hyperlipidemia)    On amiodarone therapy 09/15/2019   Pain of right lower leg 01/02/2022    SURGICAL HISTORY: Past Surgical History:  Procedure Laterality Date   afib surgical ablation  2021   CARDIOVERSION     2021 for Afib in [place]   CHOLECYSTECTOMY     GALLBLADDER SURGERY  2006   IR BONE MARROW BIOPSY & ASPIRATION  05/18/2023   MINIMALLY INVASIVE MAZE PROCEDURE     done in [place]  Dr R. Gala   stye Right 09/2022    SOCIAL HISTORY: Social History   Socioeconomic History   Marital status: Married    Spouse name: Not on file   Number of children: Not on file   Years of education: Not on file   Highest education level: Bachelor's degree (e.g., BA, AB, BS)  Occupational History   Not on file  Tobacco Use   Smoking status: Never   Smokeless tobacco: Never  Vaping Use   Vaping status: Never Used  Substance and Sexual Activity   Alcohol use: Never   Drug use: Not Currently   Sexual activity: Not Currently  Other Topics Concern   Not on file  Social History Narrative   2 sons and 1 daughter    Married    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    No guns, wears seat belt, safe in relationship   Vegan x 8-9 years as of 07/06/20    Social Drivers of Corporate investment banker Strain: Low Risk  (04/16/2023)   Overall Financial Resource Strain (CARDIA)    Difficulty of Paying Living Expenses: Not hard at all  Food Insecurity: No Food Insecurity (04/16/2023)   Hunger Vital Sign    Worried About Running Out of Food in the Last Year: Never true    Ran Out of Food in the Last Year: Never true  Transportation Needs: No Transportation Needs (04/16/2023)   PRAPARE - Administrator, Civil Service (Medical): No    Lack of Transportation (Non-Medical): No  Physical Activity: Unknown (04/16/2023)   Exercise Vital Sign    Days of  Exercise per Week: 0 days    Minutes of Exercise per Session: Not on file  Stress: No Stress Concern Present (04/16/2023)   [name]    Feeling of Stress : Not at all  Social Connections: Moderately Integrated (04/16/2023)   Social Connection and Isolation Panel    Frequency of Communication with Friends and Family: Twice a week    Frequency of Social Gatherings with Friends and Family: Twice a week    Attends Religious Services: 1 to 4 times per year    Active Member of [employer] or Organizations: No    Attends Banker Meetings: Never    Marital Status: Married  Catering manager Violence: Not At Risk (04/16/2023)   Humiliation, Afraid, Rape, and Kick questionnaire    Fear of Current or Ex-Partner: No    Emotionally Abused: No    Physically Abused: No    Sexually Abused: No    FAMILY HISTORY: Family History  Problem Relation Age of Onset   COPD Mother    Atrial fibrillation Mother        ?  due to Afib died 12   Cancer Father        lung cancer smoker age 25    Heart disease Father        bypass at 58    Atrial fibrillation Sister     ALLERGIES:  has no active allergies.  MEDICATIONS:  Current Outpatient Medications  Medication Sig Dispense Refill   acetaminophen  (TYLENOL ) 325 MG tablet Take 650 mg by mouth once a week. Take 1 hour prior to infusion appointment.     acyclovir  (ZOVIRAX ) 400 MG tablet Take 1 tablet (400 mg total) by mouth 2 (two) times daily. 180 tablet 1   ASPIRIN 81 PO Take 81 mg by mouth daily.     cyanocobalamin  1000 MCG tablet Take 2,000 mcg by mouth.     diphenhydrAMINE  (BENADRYL ) 50 MG capsule Take 50 mg by mouth once a week. Take 1 hour prior to infusion appointment.     ergocalciferol  (VITAMIN D2) 1.25 MG (50000 UT) capsule Take 1 capsule (50,000 Units total) by mouth once a week. 12 capsule 1   hydrOXYzine  (ATARAX ) 10 MG tablet Take 1 tablet (10 mg total) by mouth 3 (three)  times daily as needed. 30 tablet 0   lenalidomide  (REVLIMID ) 20 MG capsule Take 1 capsule (20 mg total) by mouth daily. Take for 14 days, then hold for 7 days. Repeat every 21 days. [[facility]] 14 capsule 0   montelukast  (SINGULAIR ) 10 MG tablet Take daily; and on days of infusion- Take 1 hour prior to infusion appointment. 90 tablet 1   Multiple Vitamin (CALCIUM COMPLEX PO) Take by mouth daily.     Omega-3 1400 MG CAPS      ondansetron  (ZOFRAN ) 8 MG tablet One pill every 8 hours as needed for nausea/vomitting. 40 tablet 1   prochlorperazine  (COMPAZINE ) 10 MG tablet Take 1 tablet (10 mg total) by mouth every 6 (six) hours as needed for nausea or vomiting. 40 tablet 1   Turmeric (CURCUMIN 95) 500 MG CAPS Take 500 mg by mouth 2 (two) times daily.     Vitamin D -Vitamin K (VITAMIN K2-VITAMIN D3 PO) Take by mouth daily.     dexamethasone  (DECADRON ) 4 MG tablet Take 5 tabs (20mg ) by mouth 1 hour prior to weekly infusion appointment and take 5 tabs (20mg ) the day after weekly infusion appointment. Take with with food . 30 tablet 0   Multiple Vitamins-Minerals (MULTIVITAMIN WOMEN PO) Take by mouth daily. (Patient not taking: Reported on 10/12/2023)     No current facility-administered medications for this visit.   Facility-Administered Medications Ordered in Other Visits  Medication Dose Route Frequency Provider Last Rate Last Admin   acetaminophen  (TYLENOL ) tablet 650 mg  650 mg Oral Once Tassie Pollett R, MD       bortezomib  SQ (VELCADE ) chemo injection (2.5mg /mL concentration) 3 mg  1.3 mg/m2 (Treatment Plan Recorded) Subcutaneous Once Keyshawna Prouse R, MD       daratumumab -hyaluronidase -fihj (DARZALEX  FASPRO) 1800-30000 MG-UT/15ML chemo SQ injection 1,800 mg  1,800 mg Subcutaneous Once Ahnesti Townsend R, MD       dexamethasone  (DECADRON ) tablet 20 mg  20 mg Oral Once Easton Sivertson R, MD       diphenhydrAMINE  (BENADRYL ) capsule 25 mg  25 mg Oral Once Sravya Grissom R, MD        montelukast  (SINGULAIR ) tablet 10 mg  10 mg Oral Once Daeshon Grammatico R, MD       PHYSICAL EXAMINATION:   Vitals:   10/12/23 0844  BP:  111/61  Pulse: 84  Resp: 18  Temp: 97.8 F (36.6 C)  SpO2: 97%     Filed Weights   10/12/23 0844  Weight: 238 lb (108 kg)     Positive for bilateral gynecomastia.    Physical Exam Vitals and nursing note reviewed.  HENT:     Head: Normocephalic and atraumatic.     Mouth/Throat:     Pharynx: Oropharynx is clear.  Eyes:     Extraocular Movements: Extraocular movements intact.     Pupils: Pupils are equal, round, and reactive to light.  Cardiovascular:     Rate and Rhythm: Normal rate and regular rhythm.  Abdominal:     Palpations: Abdomen is soft.  Musculoskeletal:        General: Normal range of motion.     Cervical back: Normal range of motion.  Skin:    General: Skin is warm.  Neurological:     General: No focal deficit present.     Mental Status: He is alert and oriented to person, place, and time.  Psychiatric:        Behavior: Behavior normal.        Judgment: Judgment normal.        LABORATORY DATA:  I have reviewed the data as listed Lab Results  Component Value Date   WBC 7.0 10/12/2023   HGB 13.2 10/12/2023   HCT 39.5 10/12/2023   MCV 90.8 10/12/2023   PLT 324 10/12/2023   Recent Labs    09/21/23 0811 09/28/23 0840 10/12/23 0842  NA 136 134* 139  K 4.5 4.0 4.0  CL 100 101 108  CO2 27 24 24   GLUCOSE 86 104* 116*  BUN 11 15 12   CREATININE 0.83 0.76 0.88  CALCIUM 8.4* 8.0* 8.7*  GFRNONAA >60 >60 >60  PROT 5.8* 5.7* 5.9*  ALBUMIN 3.6 3.5 3.5  AST 17 20 18   ALT 27 22 16   ALKPHOS 52 51 54  BILITOT 1.2 1.1 0.8   CUP PACEART REMOTE DEVICE CHECK Result Date: 10/11/2023 ILR summary report received. Battery status OK. Normal device function. No new symptom, tachy, brady, or pause episodes. 9 new AF episodes, burden 99.9%, EGMs consistent with AF, known history, LAA ligation per PA note. Monthly  summary reports and ROV/PRN - CS, CVRS  ECHOCARDIOGRAM COMPLETE Result Date: 10/11/2023    ECHOCARDIOGRAM REPORT   Patient Name:   Jimmy [name] Date of Exam: 10/11/2023 Medical Rec #:  [id number]        Height:       71.0 in Accession #:    [id number]       Weight:       242.0 lb Date of Birth:  02-04-1956        BSA:          2.286 m Patient Age:    67 years         BP:           116/64 mmHg Patient Gender: M                HR:           89 bpm. Exam Location:  [address] Procedure: 2D Echo (Both Spectral and Color Flow Doppler were utilized during            procedure). Indications:    I48.1 Persistent atrial fibrillation  History:        Patient has prior history of Echocardiogram examinations, most  recent 08/11/2021. Risk Factors:Dyslipidemia. Abnormal glucose.                 Multiple myeloma.  Sonographer:    Jon Hacker RCS Referring Phys: [id number] OLE DASEN LAMBERT IMPRESSIONS  1. Left ventricular ejection fraction, by estimation, is 55 to 60%. The left ventricle has normal function. The left ventricle has no regional wall motion abnormalities. There is mild left ventricular hypertrophy. Left ventricular diastolic parameters are indeterminate.  2. Right ventricular systolic function is low normal. The right ventricular size is normal.  3. The mitral valve is normal in structure. Trivial mitral valve regurgitation.  4. The aortic valve is tricuspid. Aortic valve regurgitation is not visualized. No aortic stenosis is present. FINDINGS  Left Ventricle: Left ventricular ejection fraction, by estimation, is 55 to 60%. The left ventricle has normal function. The left ventricle has no regional wall motion abnormalities. The left ventricular internal cavity size was normal in size. There is  mild left ventricular hypertrophy. Left ventricular diastolic parameters are indeterminate. Right Ventricle: The right ventricular size is normal. No increase in right ventricular wall thickness. Right  ventricular systolic function is low normal. Left Atrium: Left atrial size was normal in size. Right Atrium: Right atrial size was normal in size. Pericardium: There is no evidence of pericardial effusion. Mitral Valve: The mitral valve is normal in structure. Trivial mitral valve regurgitation. Tricuspid Valve: The tricuspid valve is normal in structure. Tricuspid valve regurgitation is trivial. Aortic Valve: The aortic valve is tricuspid. Aortic valve regurgitation is not visualized. No aortic stenosis is present. Pulmonic Valve: The pulmonic valve was not well visualized. Pulmonic valve regurgitation is trivial. Aorta: The aortic root and ascending aorta are structurally normal, with no evidence of dilitation. IAS/Shunts: The interatrial septum was not well visualized.  LEFT VENTRICLE PLAX 2D LVIDd:         4.92 cm LVIDs:         3.32 cm LV PW:         1.30 cm LV IVS:        1.08 cm LVOT diam:     2.20 cm LV SV:         63 LV SV Index:   27 LVOT Area:     3.80 cm  RIGHT VENTRICLE RV Basal diam:  2.88 cm RV S prime:     10.63 cm/s TAPSE (M-mode): 1.5 cm LEFT ATRIUM             Index        RIGHT ATRIUM           Index LA diam:        5.00 cm 2.19 cm/m   RA Area:     15.10 cm LA Vol (A2C):   42.9 ml 18.76 ml/m  RA Volume:   31.40 ml  13.73 ml/m LA Vol (A4C):   50.2 ml 21.96 ml/m LA Biplane Vol: 48.5 ml 21.21 ml/m  AORTIC VALVE LVOT Vmax:   87.13 cm/s LVOT Vmean:  60.100 cm/s LVOT VTI:    0.165 m  AORTA Ao Root diam: 3.30 cm Ao Asc diam:  3.40 cm TRICUSPID VALVE TR Peak grad:   19.5 mmHg TR Vmax:        221.00 cm/s  SHUNTS Systemic VTI:  0.17 m Systemic Diam: 2.20 cm Lonni Nanas MD Electronically signed by Lonni Nanas MD Signature Date/Time: 10/11/2023/3:33:27 PM    Final      Lab Results  Component Value Date  KPAFRELGTCHN 5.6 09/21/2023   KPAFRELGTCHN 12.6 08/17/2023   KPAFRELGTCHN 490.9 (H) 07/03/2023   LAMBDASER 3.7 (L) 09/21/2023   LAMBDASER 3.6 (L) 08/17/2023   LAMBDASER 4.4  (L) 07/03/2023   KAPLAMBRATIO 1.51 09/21/2023   KAPLAMBRATIO 3.50 (H) 08/17/2023   KAPLAMBRATIO 111.57 (H) 07/03/2023     Multiple myeloma in relapse (HCC) # LIGHT CHAIN MONOCLONAL GAMMOPATHY -DEC 2024-[PCP-SPEP-NEGATIVE; Hb 13; GFR-> 60 ca-WN; However, random UPEP- positive for M protein/Bence-Jones- Protein positive; kappa type.  JAN 2025- SPEP- NEG; K/l= 91 [K=400]. FEB 2025- Bone marrow- BONE MARROW, ASPIRATE, CLOT, CORE:  -  Kappa restricted plasma cell neoplasm involving 50 to 90% of the  cellular marrow (patchy areas; average 70%)   cytogenetics: 11:14; 13 q- [standard risk] Negative Congo stain. MARCH 2025-PET Numerous lytic myelomatous bone lesions throughout the axial and appendicular skeleton noted on the CT scan without associated discrete significant hypermetabolism.   S/P evaluation with Dr.Choi [facility]- bone marrow evaluation]. Discussed with Dr.Choi. TRANSPLANT ELIGIBLE. 4/25- Currently on Dara-RVD [q 21 d cycle- Griffin trial- awaiting [facility]- July 9th.   # Proceed with cycle number 4-day 1- Dara-Rev-Dex [ [q 21 d cycle- Griffin trial-; Revlimid  at 20 mg [dose reduced sec  rash].  Awaiting further workup at [facility] the bone marrow biopsy/MRD testing.discussed with Dr.Choi.   #  rash on the face- improved  with rev- 20 mg. Monitor closely.   # Multiple bone lesion-- [per pt s/p dental clearance] -Continue ca+Vit D BID. [Vit D 1000/d; 5000 q OD]. Monitor for now-follow-up Zometa given the allergic reaction to above meds-hold given ongoing hypocalcemia. Addendum: vit D 28 [June 2025]-recommend 50,000 units vitamin D  once a week; and recommend calcium 1000 mg once a day- GB  # History of a flutter [s/p ablation in [place] - clip of Left atrial appendage]-no anticoagulation- on asprin; irregular rhythm-2D echo pending.  Stable.  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  # DVT-ID Prophylaxis: asprin/acyclovir -  Stable.  # ACP: had Advance directives.   # # IV  access:PIV  # 1-4 cycles-SQ Dara-velcade  weekly x4 cycles; 5-6 dara-q2 w; La[place]; Zometa-??   MD appt- q 3W  Pre-meds-?PS  # DISPOSITION: # q 4 weeks- MM; K/L light chains # chemo today;  # in 3 week-ADD Zometa-  # appts as per IS-  Dr.B   All questions were answered. The patient knows to call the clinic with any problems, questions or concerns.    Cindy JONELLE Joe, MD 10/12/2023 10:13 AM

## 2023-10-12 NOTE — Patient Instructions (Signed)
 [facility] - A DEPT OF [facility]  Discharge Instructions: Thank you for choosing [facility] to provide your oncology and hematology care.  If you have a lab appointment with the Cancer Center, please go directly to the Cancer Center and check in at the registration area.  Wear comfortable clothing and clothing appropriate for easy access to any Portacath or PICC line.   We strive to give you quality time with your provider. You may need to reschedule your appointment if you arrive late (15 or more minutes).  Arriving late affects you and other patients whose appointments are after yours.  Also, if you miss three or more appointments without notifying the office, you may be dismissed from the clinic at the provider's discretion.      For prescription refill requests, have your pharmacy contact our office and allow 72 hours for refills to be completed.    Today you received the following chemotherapy and/or immunotherapy agents- daratumumab      To help prevent nausea and vomiting after your treatment, we encourage you to take your nausea medication as directed.  BELOW ARE SYMPTOMS THAT SHOULD BE REPORTED IMMEDIATELY: *FEVER GREATER THAN 100.4 F (38 C) OR HIGHER *CHILLS OR SWEATING *NAUSEA AND VOMITING THAT IS NOT CONTROLLED WITH YOUR NAUSEA MEDICATION *UNUSUAL SHORTNESS OF BREATH *UNUSUAL BRUISING OR BLEEDING *URINARY PROBLEMS (pain or burning when urinating, or frequent urination) *BOWEL PROBLEMS (unusual diarrhea, constipation, pain near the anus) TENDERNESS IN MOUTH AND THROAT WITH OR WITHOUT PRESENCE OF ULCERS (sore throat, sores in mouth, or a toothache) UNUSUAL RASH, SWELLING OR PAIN  UNUSUAL VAGINAL DISCHARGE OR ITCHING   Items with * indicate a potential emergency and should be followed up as soon as possible or go to the Emergency Department if any problems should occur.  Please show the CHEMOTHERAPY ALERT CARD or IMMUNOTHERAPY  ALERT CARD at check-in to the Emergency Department and triage nurse.  Should you have questions after your visit or need to cancel or reschedule your appointment, please contact [facility] - A DEPT OF [facility]  [phone] and follow the prompts.  Office hours are 8:00 a.m. to 4:30 p.m. Monday - Friday. Please note that voicemails left after 4:00 p.m. may not be returned until the following business day.  We are closed weekends and major holidays. You have access to a nurse at all times for urgent questions. Please call the main number to the clinic [phone] and follow the prompts.  For any non-urgent questions, you may also contact your provider using MyChart. We now offer e-Visits for anyone 59 and older to request care online for non-urgent symptoms. For details visit [email or web address].   Also download the MyChart app! Go to the app store, search "MyChart", open the app, select [place], and log in with your MyChart username and password.

## 2023-10-13 ENCOUNTER — Ambulatory Visit: Payer: Self-pay | Admitting: Cardiology

## 2023-10-13 ENCOUNTER — Encounter: Payer: Self-pay | Admitting: Internal Medicine

## 2023-10-15 ENCOUNTER — Other Ambulatory Visit: Payer: Self-pay | Admitting: Internal Medicine

## 2023-10-15 ENCOUNTER — Encounter: Payer: Self-pay | Admitting: Internal Medicine

## 2023-10-15 DIAGNOSIS — C9002 Multiple myeloma in relapse: Secondary | ICD-10-CM

## 2023-10-19 ENCOUNTER — Ambulatory Visit

## 2023-10-19 ENCOUNTER — Inpatient Hospital Stay

## 2023-10-19 ENCOUNTER — Ambulatory Visit: Admitting: Internal Medicine

## 2023-10-19 VITALS — BP 122/82 | HR 91 | Temp 98.0°F | Resp 18 | Ht 71.0 in | Wt 238.0 lb

## 2023-10-19 DIAGNOSIS — C9000 Multiple myeloma not having achieved remission: Secondary | ICD-10-CM

## 2023-10-19 DIAGNOSIS — C9002 Multiple myeloma in relapse: Secondary | ICD-10-CM

## 2023-10-19 DIAGNOSIS — Z5112 Encounter for antineoplastic immunotherapy: Secondary | ICD-10-CM | POA: Diagnosis not present

## 2023-10-19 LAB — CMP (CANCER CENTER ONLY)
ALT: 17 U/L (ref 0–44)
AST: 16 U/L (ref 15–41)
Albumin: 3.5 g/dL (ref 3.5–5.0)
Alkaline Phosphatase: 49 U/L (ref 38–126)
Anion gap: 7 (ref 5–15)
BUN: 12 mg/dL (ref 8–23)
CO2: 28 mmol/L (ref 22–32)
Calcium: 8.2 mg/dL — ABNORMAL LOW (ref 8.9–10.3)
Chloride: 103 mmol/L (ref 98–111)
Creatinine: 0.66 mg/dL (ref 0.61–1.24)
GFR, Estimated: >60 mL/min
Glucose, Bld: 109 mg/dL — ABNORMAL HIGH (ref 70–99)
Potassium: 4.7 mmol/L (ref 3.5–5.1)
Sodium: 138 mmol/L (ref 135–145)
Total Bilirubin: 0.8 mg/dL (ref 0.0–1.2)
Total Protein: 5.6 g/dL — ABNORMAL LOW (ref 6.5–8.1)

## 2023-10-19 LAB — CBC WITH DIFFERENTIAL (CANCER CENTER ONLY)
Abs Immature Granulocytes: 0.04 K/uL (ref 0.00–0.07)
Basophils Absolute: 0.1 K/uL (ref 0.0–0.1)
Basophils Relative: 1 %
Eosinophils Absolute: 0.2 K/uL (ref 0.0–0.5)
Eosinophils Relative: 3 %
HCT: 41.3 % (ref 39.0–52.0)
Hemoglobin: 13.5 g/dL (ref 13.0–17.0)
Immature Granulocytes: 1 %
Lymphocytes Relative: 15 %
Lymphs Abs: 0.9 K/uL (ref 0.7–4.0)
MCH: 29.7 pg (ref 26.0–34.0)
MCHC: 32.7 g/dL (ref 30.0–36.0)
MCV: 91 fL (ref 80.0–100.0)
Monocytes Absolute: 0.6 K/uL (ref 0.1–1.0)
Monocytes Relative: 9 %
Neutro Abs: 4.4 K/uL (ref 1.7–7.7)
Neutrophils Relative %: 71 %
Platelet Count: 206 K/uL (ref 150–400)
RBC: 4.54 MIL/uL (ref 4.22–5.81)
RDW: 15 % (ref 11.5–15.5)
WBC Count: 6.1 K/uL (ref 4.0–10.5)
nRBC: 0 % (ref 0.0–0.2)

## 2023-10-19 MED ORDER — ACETAMINOPHEN 325 MG PO TABS
650.0000 mg | ORAL_TABLET | Freq: Once | ORAL | Status: DC
Start: 2023-10-19 — End: 2023-10-19

## 2023-10-19 MED ORDER — MONTELUKAST SODIUM 10 MG PO TABS: 10.0000 mg | ORAL_TABLET | Freq: Every day | ORAL | Status: DC

## 2023-10-19 MED ORDER — BORTEZOMIB CHEMO SQ INJECTION 3.5 MG (2.5MG/ML)
1.3000 mg/m2 | Freq: Once | INTRAMUSCULAR | Status: AC
  Administered 2023-10-19: 3 mg via SUBCUTANEOUS
  Filled 2023-10-19: qty 1.2

## 2023-10-19 MED ORDER — DARATUMUMAB-HYALURONIDASE-FIHJ 1800-30000 MG-UT/15ML ~~LOC~~ SOLN
1800.0000 mg | Freq: Once | SUBCUTANEOUS | Status: AC
  Administered 2023-10-19: 1800 mg via SUBCUTANEOUS
  Filled 2023-10-19: qty 15

## 2023-10-19 MED ORDER — DIPHENHYDRAMINE HCL 25 MG PO CAPS
25.0000 mg | ORAL_CAPSULE | Freq: Once | ORAL | Status: DC
Start: 2023-10-19 — End: 2023-10-19

## 2023-10-19 MED ORDER — DEXAMETHASONE 4 MG PO TABS: 20.0000 mg | ORAL_TABLET | Freq: Once | ORAL | Status: DC

## 2023-10-19 NOTE — Patient Instructions (Signed)

## 2023-10-22 LAB — KAPPA/LAMBDA LIGHT CHAINS
Kappa free light chain: 5.5 mg/L (ref 3.3–19.4)
Kappa, lambda light chain ratio: 1.72 — ABNORMAL HIGH (ref 0.26–1.65)
Lambda free light chains: 3.2 mg/L — ABNORMAL LOW (ref 5.7–26.3)

## 2023-10-23 LAB — MULTIPLE MYELOMA PANEL, SERUM
Albumin SerPl Elph-Mcnc: 3.1 g/dL (ref 2.9–4.4)
Albumin/Glob SerPl: 1.5 (ref 0.7–1.7)
Alpha 1: 0.2 g/dL (ref 0.0–0.4)
Alpha2 Glob SerPl Elph-Mcnc: 0.8 g/dL (ref 0.4–1.0)
B-Globulin SerPl Elph-Mcnc: 0.9 g/dL (ref 0.7–1.3)
Gamma Glob SerPl Elph-Mcnc: 0.3 g/dL — ABNORMAL LOW (ref 0.4–1.8)
Globulin, Total: 2.2 g/dL (ref 2.2–3.9)
IgA: 16 mg/dL — ABNORMAL LOW (ref 61–437)
IgG (Immunoglobin G), Serum: 324 mg/dL — ABNORMAL LOW (ref 603–1613)
IgM (Immunoglobulin M), Srm: 11 mg/dL — ABNORMAL LOW (ref 20–172)
M Protein SerPl Elph-Mcnc: 0.1 g/dL — ABNORMAL HIGH
Total Protein ELP: 5.3 g/dL — ABNORMAL LOW (ref 6.0–8.5)

## 2023-10-23 NOTE — Addendum Note (Signed)
 Addended by: TAWNI DRILLING D on: 10/23/2023 02:40 PM   Modules accepted: Orders

## 2023-10-23 NOTE — Progress Notes (Signed)
 [facility] Summary Report / Loop Recorder

## 2023-10-24 ENCOUNTER — Other Ambulatory Visit: Payer: Self-pay | Admitting: *Deleted

## 2023-10-24 DIAGNOSIS — C9000 Multiple myeloma not having achieved remission: Secondary | ICD-10-CM

## 2023-10-24 MED ORDER — LENALIDOMIDE 20 MG PO CAPS: 20.0000 mg | ORAL_CAPSULE | Freq: Every day | ORAL | 0 refills | Status: DC

## 2023-10-25 ENCOUNTER — Encounter: Payer: Self-pay | Admitting: Internal Medicine

## 2023-10-26 ENCOUNTER — Inpatient Hospital Stay

## 2023-10-26 ENCOUNTER — Ambulatory Visit: Admitting: Internal Medicine

## 2023-10-26 ENCOUNTER — Ambulatory Visit

## 2023-10-26 VITALS — BP 130/82 | HR 84 | Temp 97.5°F | Resp 16 | Wt 250.2 lb

## 2023-10-26 DIAGNOSIS — C9002 Multiple myeloma in relapse: Secondary | ICD-10-CM

## 2023-10-26 DIAGNOSIS — Z5112 Encounter for antineoplastic immunotherapy: Secondary | ICD-10-CM | POA: Diagnosis not present

## 2023-10-26 LAB — CBC WITH DIFFERENTIAL (CANCER CENTER ONLY)
Abs Immature Granulocytes: 0.04 K/uL (ref 0.00–0.07)
Basophils Absolute: 0 K/uL (ref 0.0–0.1)
Basophils Relative: 1 %
Eosinophils Absolute: 0.3 K/uL (ref 0.0–0.5)
Eosinophils Relative: 4 %
HCT: 38 % — ABNORMAL LOW (ref 39.0–52.0)
Hemoglobin: 12.9 g/dL — ABNORMAL LOW (ref 13.0–17.0)
Immature Granulocytes: 1 %
Lymphocytes Relative: 9 %
Lymphs Abs: 0.7 K/uL (ref 0.7–4.0)
MCH: 30.3 pg (ref 26.0–34.0)
MCHC: 33.9 g/dL (ref 30.0–36.0)
MCV: 89.2 fL (ref 80.0–100.0)
Monocytes Absolute: 1 K/uL (ref 0.1–1.0)
Monocytes Relative: 13 %
Neutro Abs: 5.9 K/uL (ref 1.7–7.7)
Neutrophils Relative %: 72 %
Platelet Count: 129 K/uL — ABNORMAL LOW (ref 150–400)
RBC: 4.26 MIL/uL (ref 4.22–5.81)
RDW: 15.6 % — ABNORMAL HIGH (ref 11.5–15.5)
WBC Count: 7.9 K/uL (ref 4.0–10.5)
nRBC: 0 % (ref 0.0–0.2)

## 2023-10-26 LAB — CMP (CANCER CENTER ONLY)
ALT: 24 U/L (ref 0–44)
AST: 14 U/L — ABNORMAL LOW (ref 15–41)
Albumin: 3.3 g/dL — ABNORMAL LOW (ref 3.5–5.0)
Alkaline Phosphatase: 44 U/L (ref 38–126)
Anion gap: 7 (ref 5–15)
BUN: 17 mg/dL (ref 8–23)
CO2: 27 mmol/L (ref 22–32)
Calcium: 8 mg/dL — ABNORMAL LOW (ref 8.9–10.3)
Chloride: 100 mmol/L (ref 98–111)
Creatinine: 0.87 mg/dL (ref 0.61–1.24)
GFR, Estimated: >60 mL/min (ref >60)
Glucose, Bld: 104 mg/dL — ABNORMAL HIGH (ref 70–99)
Potassium: 4.3 mmol/L (ref 3.5–5.1)
Sodium: 134 mmol/L — ABNORMAL LOW (ref 135–145)
Total Bilirubin: 0.7 mg/dL (ref 0.0–1.2)
Total Protein: 5.2 g/dL — ABNORMAL LOW (ref 6.5–8.1)

## 2023-10-26 MED ORDER — MONTELUKAST SODIUM 10 MG PO TABS: 10.0000 mg | ORAL_TABLET | Freq: Every day | ORAL | Status: DC

## 2023-10-26 MED ORDER — BORTEZOMIB CHEMO SQ INJECTION 3.5 MG (2.5MG/ML)
1.3000 mg/m2 | Freq: Once | INTRAMUSCULAR | Status: AC
  Administered 2023-10-26: 3 mg via SUBCUTANEOUS
  Filled 2023-10-26: qty 1.2

## 2023-10-26 MED ORDER — DIPHENHYDRAMINE HCL 25 MG PO CAPS: 25.0000 mg | ORAL_CAPSULE | Freq: Once | ORAL | Status: DC

## 2023-10-26 MED ORDER — ACETAMINOPHEN 325 MG PO TABS: 650.0000 mg | ORAL_TABLET | Freq: Once | ORAL | Status: DC

## 2023-10-26 MED ORDER — DEXAMETHASONE 4 MG PO TABS: 20.0000 mg | ORAL_TABLET | Freq: Once | ORAL | Status: DC

## 2023-10-26 MED ORDER — DARATUMUMAB-HYALURONIDASE-FIHJ 1800-30000 MG-UT/15ML ~~LOC~~ SOLN
1800.0000 mg | Freq: Once | SUBCUTANEOUS | Status: AC
  Administered 2023-10-26: 1800 mg via SUBCUTANEOUS
  Filled 2023-10-26: qty 15

## 2023-10-29 LAB — MULTIPLE MYELOMA PANEL, SERUM
Albumin SerPl Elph-Mcnc: 3 g/dL (ref 2.9–4.4)
Albumin/Glob SerPl: 1.6 (ref 0.7–1.7)
Alpha 1: 0.2 g/dL (ref 0.0–0.4)
Alpha2 Glob SerPl Elph-Mcnc: 0.7 g/dL (ref 0.4–1.0)
B-Globulin SerPl Elph-Mcnc: 0.8 g/dL (ref 0.7–1.3)
Gamma Glob SerPl Elph-Mcnc: 0.2 g/dL — ABNORMAL LOW (ref 0.4–1.8)
Globulin, Total: 2 g/dL — ABNORMAL LOW (ref 2.2–3.9)
IgA: 14 mg/dL — ABNORMAL LOW (ref 61–437)
IgG (Immunoglobin G), Serum: 325 mg/dL — ABNORMAL LOW (ref 603–1613)
IgM (Immunoglobulin M), Srm: 9 mg/dL — ABNORMAL LOW (ref 20–172)
M Protein SerPl Elph-Mcnc: 0.1 g/dL — ABNORMAL HIGH
Total Protein ELP: 5 g/dL — ABNORMAL LOW (ref 6.0–8.5)

## 2023-10-29 LAB — KAPPA/LAMBDA LIGHT CHAINS
Kappa free light chain: 6.2 mg/L (ref 3.3–19.4)
Kappa, lambda light chain ratio: 1.59 (ref 0.26–1.65)
Lambda free light chains: 3.9 mg/L — ABNORMAL LOW (ref 5.7–26.3)

## 2023-11-02 ENCOUNTER — Inpatient Hospital Stay: Admitting: Internal Medicine

## 2023-11-02 ENCOUNTER — Inpatient Hospital Stay: Attending: Internal Medicine

## 2023-11-02 ENCOUNTER — Encounter: Payer: Self-pay | Admitting: Internal Medicine

## 2023-11-02 ENCOUNTER — Inpatient Hospital Stay

## 2023-11-02 ENCOUNTER — Telehealth: Payer: Self-pay | Admitting: *Deleted

## 2023-11-02 VITALS — BP 112/77 | HR 79 | Temp 97.1°F | Resp 19 | Ht 71.0 in | Wt 250.2 lb

## 2023-11-02 DIAGNOSIS — C9002 Multiple myeloma in relapse: Secondary | ICD-10-CM

## 2023-11-02 DIAGNOSIS — R21 Rash and other nonspecific skin eruption: Secondary | ICD-10-CM | POA: Insufficient documentation

## 2023-11-02 DIAGNOSIS — Z5112 Encounter for antineoplastic immunotherapy: Secondary | ICD-10-CM | POA: Insufficient documentation

## 2023-11-02 DIAGNOSIS — Z801 Family history of malignant neoplasm of trachea, bronchus and lung: Secondary | ICD-10-CM | POA: Diagnosis not present

## 2023-11-02 DIAGNOSIS — C9000 Multiple myeloma not having achieved remission: Secondary | ICD-10-CM

## 2023-11-02 LAB — CMP (CANCER CENTER ONLY)
ALT: 30 U/L (ref 0–44)
AST: 17 U/L (ref 15–41)
Albumin: 3.2 g/dL — ABNORMAL LOW (ref 3.5–5.0)
Alkaline Phosphatase: 40 U/L (ref 38–126)
Anion gap: 8 (ref 5–15)
BUN: 19 mg/dL (ref 8–23)
CO2: 24 mmol/L (ref 22–32)
Calcium: 8.1 mg/dL — ABNORMAL LOW (ref 8.9–10.3)
Chloride: 104 mmol/L (ref 98–111)
Creatinine: 0.82 mg/dL (ref 0.61–1.24)
GFR, Estimated: >60 mL/min (ref >60)
Glucose, Bld: 122 mg/dL — ABNORMAL HIGH (ref 70–99)
Potassium: 4.2 mmol/L (ref 3.5–5.1)
Sodium: 136 mmol/L (ref 135–145)
Total Bilirubin: 1 mg/dL (ref 0.0–1.2)
Total Protein: 5.3 g/dL — ABNORMAL LOW (ref 6.5–8.1)

## 2023-11-02 LAB — CBC WITH DIFFERENTIAL (CANCER CENTER ONLY)
Abs Immature Granulocytes: 0.03 K/uL (ref 0.00–0.07)
Basophils Absolute: 0 K/uL (ref 0.0–0.1)
Basophils Relative: 0 %
Eosinophils Absolute: 0.3 K/uL (ref 0.0–0.5)
Eosinophils Relative: 4 %
HCT: 39.2 % (ref 39.0–52.0)
Hemoglobin: 13.2 g/dL (ref 13.0–17.0)
Immature Granulocytes: 0 %
Lymphocytes Relative: 12 %
Lymphs Abs: 0.9 K/uL (ref 0.7–4.0)
MCH: 29.9 pg (ref 26.0–34.0)
MCHC: 33.7 g/dL (ref 30.0–36.0)
MCV: 88.9 fL (ref 80.0–100.0)
Monocytes Absolute: 0.8 K/uL (ref 0.1–1.0)
Monocytes Relative: 12 %
Neutro Abs: 5 K/uL (ref 1.7–7.7)
Neutrophils Relative %: 72 %
Platelet Count: 89 K/uL — ABNORMAL LOW (ref 150–400)
RBC: 4.41 MIL/uL (ref 4.22–5.81)
RDW: 15.5 % (ref 11.5–15.5)
WBC Count: 7 K/uL (ref 4.0–10.5)
nRBC: 0 % (ref 0.0–0.2)

## 2023-11-02 MED ORDER — DEXAMETHASONE 4 MG PO TABS: 20.0000 mg | ORAL_TABLET | Freq: Once | ORAL | Status: DC

## 2023-11-02 MED ORDER — MONTELUKAST SODIUM 10 MG PO TABS: 10.0000 mg | ORAL_TABLET | Freq: Once | ORAL | Status: DC

## 2023-11-02 MED ORDER — DARATUMUMAB-HYALURONIDASE-FIHJ 1800-30000 MG-UT/15ML ~~LOC~~ SOLN
1800.0000 mg | Freq: Once | SUBCUTANEOUS | Status: AC
  Administered 2023-11-02: 1800 mg via SUBCUTANEOUS
  Filled 2023-11-02: qty 15

## 2023-11-02 MED ORDER — BORTEZOMIB CHEMO SQ INJECTION 3.5 MG (2.5MG/ML)
1.3000 mg/m2 | Freq: Once | INTRAMUSCULAR | Status: AC
  Administered 2023-11-02: 3 mg via SUBCUTANEOUS
  Filled 2023-11-02: qty 1.2

## 2023-11-02 MED ORDER — DIPHENHYDRAMINE HCL 25 MG PO CAPS: 25.0000 mg | ORAL_CAPSULE | Freq: Once | ORAL | Status: DC

## 2023-11-02 MED ORDER — ACETAMINOPHEN 325 MG PO TABS: 650.0000 mg | ORAL_TABLET | Freq: Once | ORAL | Status: DC

## 2023-11-02 NOTE — Patient Instructions (Signed)
 [facility] - A DEPT OF [facility]  Discharge Instructions: Thank you for choosing [facility] to provide your oncology and hematology care.  If you have a lab appointment with the Cancer Center, please go directly to the Cancer Center and check in at the registration area.  Wear comfortable clothing and clothing appropriate for easy access to any Portacath or PICC line.   We strive to give you quality time with your provider. You may need to reschedule your appointment if you arrive late (15 or more minutes).  Arriving late affects you and other patients whose appointments are after yours.  Also, if you miss three or more appointments without notifying the office, you may be dismissed from the clinic at the provider's discretion.      For prescription refill requests, have your pharmacy contact our office and allow 72 hours for refills to be completed.    Today you received the following chemotherapy and/or immunotherapy agents DARZALEX and VELCADE     To help prevent nausea and vomiting after your treatment, we encourage you to take your nausea medication as directed.  BELOW ARE SYMPTOMS THAT SHOULD BE REPORTED IMMEDIATELY: *FEVER GREATER THAN 100.4 F (38 C) OR HIGHER *CHILLS OR SWEATING *NAUSEA AND VOMITING THAT IS NOT CONTROLLED WITH YOUR NAUSEA MEDICATION *UNUSUAL SHORTNESS OF BREATH *UNUSUAL BRUISING OR BLEEDING *URINARY PROBLEMS (pain or burning when urinating, or frequent urination) *BOWEL PROBLEMS (unusual diarrhea, constipation, pain near the anus) TENDERNESS IN MOUTH AND THROAT WITH OR WITHOUT PRESENCE OF ULCERS (sore throat, sores in mouth, or a toothache) UNUSUAL RASH, SWELLING OR PAIN  UNUSUAL VAGINAL DISCHARGE OR ITCHING   Items with * indicate a potential emergency and should be followed up as soon as possible or go to the Emergency Department if any problems should occur.  Please show the CHEMOTHERAPY ALERT CARD or  IMMUNOTHERAPY ALERT CARD at check-in to the Emergency Department and triage nurse.  Should you have questions after your visit or need to cancel or reschedule your appointment, please contact [facility] - A DEPT OF [facility]  [phone] and follow the prompts.  Office hours are 8:00 a.m. to 4:30 p.m. Monday - Friday. Please note that voicemails left after 4:00 p.m. may not be returned until the following business day.  We are closed weekends and major holidays. You have access to a nurse at all times for urgent questions. Please call the main number to the clinic [phone] and follow the prompts.  For any non-urgent questions, you may also contact your provider using MyChart. We now offer e-Visits for anyone 13 and older to request care online for non-urgent symptoms. For details visit [email or web address].   Also download the MyChart app! Go to the app store, search "MyChart", open the app, select [facility], and log in with your MyChart username and password.  Daratumumab; Hyaluronidase Injection What is this medication? DARATUMUMAB; HYALURONIDASE (dar a toom ue mab; hye al ur ON i dase) treats multiple myeloma, a type of bone marrow cancer. Daratumumab works by blocking a protein that causes cancer cells to grow and multiply. This helps to slow or stop the spread of cancer cells. Hyaluronidase works by increasing the absorption of other medications in the body to help them work better. This medication may also be used treat amyloidosis, a condition that causes the buildup of a protein (amyloid) in your body. It works by reducing the  buildup of this protein, which decreases symptoms. It is a combination medication that contains a monoclonal antibody. This medicine may be used for other purposes; ask your health care provider or pharmacist if you have questions. COMMON BRAND NAME(S): DARZALEX FASPRO What should I tell my care team before I take this  medication? They need to know if you have any of these conditions: Heart disease Infection, such as chickenpox, cold sores, herpes, hepatitis B Lung or breathing disease An unusual or allergic reaction to daratumumab, hyaluronidase, other medications, foods, dyes, or preservatives Pregnant or trying to get pregnant Breast-feeding How should I use this medication? This medication is injected under the skin. It is given by your care team in a hospital or clinic setting. Talk to your care team about the use of this medication in children. Special care may be needed. Overdosage: If you think you have taken too much of this medicine contact a poison control center or emergency room at once. NOTE: This medicine is only for you. Do not share this medicine with others. What if I miss a dose? Keep appointments for follow-up doses. It is important not to miss your dose. Call your care team if you are unable to keep an appointment. What may interact with this medication? Interactions have not been studied. This list may not describe all possible interactions. Give your health care provider a list of all the medicines, herbs, non-prescription drugs, or dietary supplements you use. Also tell them if you smoke, drink alcohol, or use illegal drugs. Some items may interact with your medicine. What should I watch for while using this medication? Your condition will be monitored carefully while you are receiving this medication. This medication can cause serious allergic reactions. To reduce your risk, your care team may give you other medication to take before receiving this one. Be sure to follow the directions from your care team. This medication can affect the results of blood tests to match your blood type. These changes can last for up to 6 months after the final dose. Your care team will do blood tests to match your blood type before you start treatment. Tell all of your care team that you are being  treated with this medication before receiving a blood transfusion. This medication can affect the results of some tests used to determine treatment response; extra tests may be needed to evaluate response. Talk to your care team if you wish to become pregnant or think you are pregnant. This medication can cause serious birth defects if taken during pregnancy and for 3 months after the last dose. A reliable form of contraception is recommended while taking this medication and for 3 months after the last dose. Talk to your care team about effective forms of contraception. Do not breast-feed while taking this medication. What side effects may I notice from receiving this medication? Side effects that you should report to your care team as soon as possible: Allergic reactions--skin rash, itching, hives, swelling of the face, lips, tongue, or throat Heart rhythm changes--fast or irregular heartbeat, dizziness, feeling faint or lightheaded, chest pain, trouble breathing Infection--fever, chills, cough, sore throat, wounds that don't heal, pain or trouble when passing urine, general feeling of discomfort or being unwell Infusion reactions--chest pain, shortness of breath or trouble breathing, feeling faint or lightheaded Sudden eye pain or change in vision such as blurry vision, seeing halos around lights, vision loss Unusual bruising or bleeding Side effects that usually do not require medical attention (report to  your care team if they continue or are bothersome): Constipation Diarrhea Fatigue Nausea Pain, tingling, or numbness in the hands or feet Swelling of the ankles, hands, or feet This list may not describe all possible side effects. Call your doctor for medical advice about side effects. You may report side effects to FDA at 1-800-FDA-1088. Where should I keep my medication? This medication is given in a hospital or clinic. It will not be stored at home. NOTE: This sheet is a summary. It may  not cover all possible information. If you have questions about this medicine, talk to your doctor, pharmacist, or health care provider.  2024 Elsevier/Gold Standard (2021-07-26 00:00:00)  Bortezomib Injection What is this medication? BORTEZOMIB (bor TEZ oh mib) treats lymphoma. It may also be used to treat multiple myeloma, a type of bone marrow cancer. It works by blocking a protein that causes cancer cells to grow and multiply. This helps to slow or stop the spread of cancer cells. This medicine may be used for other purposes; ask your health care provider or pharmacist if you have questions. COMMON BRAND NAME(S): BORUZU, Velcade What should I tell my care team before I take this medication? They need to know if you have any of these conditions: Dehydration Diabetes Heart disease Liver disease Tingling of the fingers or toes or other nerve disorder An unusual or allergic reaction to bortezomib, other medications, foods, dyes, or preservatives If you or your partner are pregnant or trying to get pregnant Breastfeeding How should I use this medication? This medication is injected into a vein or under the skin. It is given by your care team in a hospital or clinic setting. Talk to your care team about the use of this medication in children. Special care may be needed. Overdosage: If you think you have taken too much of this medicine contact a poison control center or emergency room at once. NOTE: This medicine is only for you. Do not share this medicine with others. What if I miss a dose? Keep appointments for follow-up doses. It is important not to miss your dose. Call your care team if you are unable to keep an appointment. What may interact with this medication? Ketoconazole Rifampin This list may not describe all possible interactions. Give your health care provider a list of all the medicines, herbs, non-prescription drugs, or dietary supplements you use. Also tell them if you  smoke, drink alcohol, or use illegal drugs. Some items may interact with your medicine. What should I watch for while using this medication? Your condition will be monitored carefully while you are receiving this medication. You may need blood work while taking this medication. This medication may affect your coordination, reaction time, or judgment. Do not drive or operate machinery until you know how this medication affects you. Sit up or stand slowly to reduce the risk of dizzy or fainting spells. Drinking alcohol with this medication can increase the risk of these side effects. This medication may increase your risk of getting an infection. Call your care team for advice if you get a fever, chills, sore throat, or other symptoms of a cold or flu. Do not treat yourself. Try to avoid being around people who are sick. Check with your care team if you have severe diarrhea, nausea, and vomiting, or if you sweat a lot. The loss of too much body fluid may make it dangerous for you to take this medication. Talk to your care team if you may be pregnant. Serious  birth defects can occur if you take this medication during pregnancy and for 7 months after the last dose. You will need a negative pregnancy test before starting this medication. Contraception is recommended while taking this medication and for 7 months after the last dose. Your care team can help you find the option that works for you. If your partner can get pregnant, use a condom during sex while taking this medication and for 4 months after the last dose. Do not breastfeed while taking this medication and for 2 months after the last dose. This medication may cause infertility. Talk to your care team if you are concerned about your fertility. What side effects may I notice from receiving this medication? Side effects that you should report to your care team as soon as possible: Allergic reactions--skin rash, itching, hives, swelling of the face,  lips, tongue, or throat Bleeding--bloody or black, tar-like stools, vomiting blood or brown material that looks like coffee grounds, red or dark brown urine, small red or purple spots on skin, unusual bruising or bleeding Bleeding in the brain--severe headache, stiff neck, confusion, dizziness, change in vision, numbness or weakness of the face, arm, or leg, trouble speaking, trouble walking, vomiting Bowel blockage--stomach cramping, unable to have a bowel movement or pass gas, loss of appetite, vomiting Heart failure--shortness of breath, swelling of the ankles, feet, or hands, sudden weight gain, unusual weakness or fatigue Infection--fever, chills, cough, sore throat, wounds that don't heal, pain or trouble when passing urine, general feeling of discomfort or being unwell Liver injury--right upper belly pain, loss of appetite, nausea, light-colored stool, dark yellow or brown urine, yellowing skin or eyes, unusual weakness or fatigue Low blood pressure--dizziness, feeling faint or lightheaded, blurry vision Lung injury--shortness of breath or trouble breathing, cough, spitting up blood, chest pain, fever Pain, tingling, or numbness in the hands or feet Severe or prolonged diarrhea Stomach pain, bloody diarrhea, pale skin, unusual weakness or fatigue, decrease in the amount of urine, which may be signs of hemolytic uremic syndrome Sudden and severe headache, confusion, change in vision, seizures, which may be signs of posterior reversible encephalopathy syndrome (PRES) TTP--purple spots on the skin or inside the mouth, pale skin, yellowing skin or eyes, unusual weakness or fatigue, fever, fast or irregular heartbeat, confusion, change in vision, trouble speaking, trouble walking Tumor lysis syndrome (TLS)--nausea, vomiting, diarrhea, decrease in the amount of urine, dark urine, unusual weakness or fatigue, confusion, muscle pain or cramps, fast or irregular heartbeat, joint pain Side effects that  usually do not require medical attention (report to your care team if they continue or are bothersome): Constipation Diarrhea Fatigue Loss of appetite Nausea This list may not describe all possible side effects. Call your doctor for medical advice about side effects. You may report side effects to FDA at 1-800-FDA-1088. Where should I keep my medication? This medication is given in a hospital or clinic. It will not be stored at home. NOTE: This sheet is a summary. It may not cover all possible information. If you have questions about this medicine, talk to your doctor, pharmacist, or health care provider.  2024 Elsevier/Gold Standard (2021-08-23 00:00:00)

## 2023-11-02 NOTE — Progress Notes (Signed)
 [facility] CONSULT NOTE  Patient Care Team: [name], MD as PCP - General (Internal Medicine) Cindie Ole DASEN, MD as PCP - Electrophysiology (Cardiology) Rennie Cindy SAUNDERS, MD as Consulting Physician (Oncology)  CHIEF COMPLAINTS/PURPOSE OF CONSULTATION: Multiple Myeloma   Oncology History Overview Note  # LIGHT CHAIN MONOCLONAL GAMMOPATHY -DEC 2024-[PCP-SPEP-NEGATIVE; Hb 13; GFR-> 60 ca-WN; However, random UPEP- positive for M protein/Bence-Jones- Protein positive; kappa type.  JAN 2025- SPEP- NEG; K/l= 91 [K=400]. Dx= includes smoldering MM vs active MM. 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)   06/05/2023 Cancer Staging   Staging form: Plasma Cell Myeloma and Plasma Cell Disorders, AJCC 8th Edition - Clinical: Albumin (g/dL): 4, ISS: Stage II, High-risk cytogenetics: Absent, LDH: Normal - Signed by Rennie Cindy SAUNDERS, MD on 06/05/2023 Albumin range (g/dL): Greater than or equal to 3.5 Cytogenetics: t(11;14) translocation   06/06/2023 Cancer Staging   Staging form: Plasma Cell Myeloma and Plasma Cell Disorders, AJCC 8th Edition - Clinical: RISS Stage I (Beta-2 -microglobulin (mg/L): 2.5, Albumin (g/dL): 4, ISS: Stage I, High-risk cytogenetics: Absent, LDH: Normal) - Signed by  Rennie Cindy SAUNDERS, MD on 06/06/2023 Stage prefix: Initial diagnosis Beta 2 microglobulin range (mg/L): Less than 3.5 Albumin range (g/dL): Greater than or equal to 3.5 Cytogenetics: t(11;14) translocation   07/27/2023 -  Chemotherapy   Patient is on Treatment Plan : MYELOMA NEWLY DIAGNOSED TRANSPLANT CANDIDATE DaraVRd (Daratumumab  SQ) with weekly bortezomib  (D1,8,15) q21d x 6 Cycles (Induction/Consolidation)      HISTORY OF PRESENTING ILLNESS: Patient ambulating-independently. Accompanied by wife.   Jimmy [name] 68 y.o.  male with A.fib [watchman device; not on anti-coagulation] Multiple Myeloma [dx: march 2025] with multiple bone lesions-is here to proceed with with DARA-RVD-  chemotherapy is here for a follow-up.   Patient denies any neuropathy in his feet. Noted to rash resolved. NO swelling.  No falls.  In general appetite is good.  Denies any pain.  Patient is currently on vitamin D  supplementation.  Review of Systems  Constitutional:  Negative for chills, diaphoresis, fever and weight loss.  HENT:  Negative for nosebleeds and sore throat.   Eyes:  Negative for double vision.  Respiratory:  Negative for cough, hemoptysis, sputum production, shortness of breath and wheezing.   Cardiovascular:  Negative for chest pain, palpitations, orthopnea and leg swelling.  Gastrointestinal:  Negative for abdominal pain, blood in stool, constipation, diarrhea, heartburn, melena, nausea and vomiting.  Genitourinary:  Negative for dysuria, frequency and urgency.  Musculoskeletal:  Negative for back pain and joint pain.  Skin: Negative.  Negative for itching and rash.  Neurological:  Negative for dizziness, tingling, focal weakness, weakness and headaches.  Endo/Heme/Allergies:  Does not bruise/bleed easily.  Psychiatric/Behavioral:  Negative for depression. The patient is not nervous/anxious and does not have insomnia.     MEDICAL HISTORY:  Past Medical History:  Diagnosis Date   Atrial  fibrillation (HCC)    had loop recorder, PAcs after DCCV 09/2019   Atrial flutter (HCC)    Fatigue    HLD (hyperlipidemia)    On amiodarone therapy 09/15/2019   Pain of right lower leg 01/02/2022    SURGICAL HISTORY: Past Surgical History:  Procedure Laterality Date   afib surgical ablation  2021   CARDIOVERSION     2021 for Afib in [place]   CHOLECYSTECTOMY     GALLBLADDER SURGERY  2006   IR BONE MARROW BIOPSY & ASPIRATION  05/18/2023   MINIMALLY INVASIVE MAZE PROCEDURE     done in [place]  Dr JONELLE. Gala   stye Right 09/2022    SOCIAL HISTORY: Social History   Socioeconomic History   Marital status: Married    Spouse name: Not on file   Number of children: Not on file   Years of education: Not on file   Highest education level: Bachelor's degree (e.g., BA, AB, BS)  Occupational History   Not on file  Tobacco Use   Smoking status: Never   Smokeless tobacco: Never  Vaping Use   Vaping status: Never Used  Substance and Sexual Activity   Alcohol use: Never   Drug use: Not Currently   Sexual activity: Not Currently  Other Topics Concern   Not on file  Social History Narrative   2 sons and 1 daughter    Married    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    No guns, wears seat belt, safe in relationship   Vegan x 8-9 years as of 07/06/20    Social Drivers of Corporate investment banker Strain: Low Risk  (04/16/2023)   Overall Financial Resource Strain (CARDIA)    Difficulty of Paying Living Expenses: Not hard at all  Food Insecurity: No Food Insecurity (04/16/2023)   Hunger Vital Sign    Worried About Running Out of Food in the Last Year: Never true    Ran Out of Food in the Last Year: Never true  Transportation Needs: No Transportation Needs (04/16/2023)   PRAPARE - Administrator, Civil Service (Medical): No    Lack of Transportation (Non-Medical): No  Physical Activity: Unknown (04/16/2023)   Exercise Vital Sign    Days of Exercise per Week: 0 days     Minutes of Exercise per Session: Not on file  Stress: No Stress Concern Present (04/16/2023)   [name]    Feeling of Stress : Not at all  Social Connections: Moderately Integrated (04/16/2023)   Social Connection and Isolation Panel    Frequency of Communication with Friends and Family: Twice a week    Frequency of Social Gatherings with Friends and Family: Twice a week    Attends Religious Services: 1 to 4 times per year    Active Member of [employer] or Organizations: No    Attends Banker Meetings: Never    Marital Status: Married  Catering manager Violence: Not At Risk (04/16/2023)   Humiliation, Afraid, Rape, and Kick questionnaire    Fear of Current or Ex-Partner: No    Emotionally Abused: No    Physically Abused: No    Sexually Abused: No    FAMILY HISTORY: Family History  Problem Relation Age of Onset   COPD Mother    Atrial fibrillation Mother        ? due to Afib  died 51   Cancer Father        lung cancer smoker age 50    Heart disease Father        bypass at 81    Atrial fibrillation Sister     ALLERGIES:  has no known allergies.  MEDICATIONS:  Current Outpatient Medications  Medication Sig Dispense Refill   acetaminophen  (TYLENOL ) 325 MG tablet Take 650 mg by mouth once a week. Take 1 hour prior to infusion appointment.     acyclovir  (ZOVIRAX ) 400 MG tablet Take 1 tablet (400 mg total) by mouth 2 (two) times daily. 180 tablet 1   ASPIRIN 81 PO Take 81 mg by mouth daily.     cyanocobalamin  1000 MCG tablet Take 2,000 mcg by mouth.     dexamethasone  (DECADRON ) 4 MG tablet Take 5 tabs (20mg ) by mouth 1 hour prior to weekly infusion appointment and take 5 tabs (20mg ) the day after weekly infusion appointment. Take with with food . 30 tablet 0   diphenhydrAMINE  (BENADRYL ) 50 MG capsule Take 50 mg by mouth once a week. Take 1 hour prior to infusion appointment.     ergocalciferol  (VITAMIN D2)  1.25 MG (50000 UT) capsule Take 1 capsule (50,000 Units total) by mouth once a week. 12 capsule 1   hydrOXYzine  (ATARAX ) 10 MG tablet Take 1 tablet (10 mg total) by mouth 3 (three) times daily as needed. 30 tablet 0   lenalidomide  (REVLIMID ) 20 MG capsule Take 1 capsule (20 mg total) by mouth daily. Take for 14 days, then hold for 7 days. Repeat every 21 days. [[facility]] 14 capsule 0   montelukast  (SINGULAIR ) 10 MG tablet Take daily; and on days of infusion- Take 1 hour prior to infusion appointment. 90 tablet 1   Multiple Vitamin (CALCIUM COMPLEX PO) Take by mouth daily.     Multiple Vitamins-Minerals (MULTIVITAMIN WOMEN PO) Take by mouth daily. (Patient not taking: Reported on 10/12/2023)     Omega-3 1400 MG CAPS      ondansetron  (ZOFRAN ) 8 MG tablet One pill every 8 hours as needed for nausea/vomitting. 40 tablet 1   prochlorperazine  (COMPAZINE ) 10 MG tablet Take 1 tablet (10 mg total) by mouth every 6 (six) hours as needed for nausea or vomiting. 40 tablet 1   Turmeric (CURCUMIN 95) 500 MG CAPS Take 500 mg by mouth 2 (two) times daily.     Vitamin D -Vitamin K (VITAMIN K2-VITAMIN D3 PO) Take by mouth daily.     No current facility-administered medications for this visit.   Facility-Administered Medications Ordered in Other Visits  Medication Dose Route Frequency Provider Last Rate Last Admin   acetaminophen  (TYLENOL ) tablet 650 mg  650 mg Oral Once Keiji Melland R, MD       bortezomib  SQ (VELCADE ) chemo injection (2.5mg /mL concentration) 3 mg  1.3 mg/m2 (Treatment Plan Recorded) Subcutaneous Once Keysean Savino R, MD       daratumumab -hyaluronidase -fihj (DARZALEX  FASPRO) 1800-30000 MG-UT/15ML chemo SQ injection 1,800 mg  1,800 mg Subcutaneous Once Lane Kjos R, MD       dexamethasone  (DECADRON ) tablet 20 mg  20 mg Oral Once Charon Akamine R, MD       diphenhydrAMINE  (BENADRYL ) capsule 25 mg  25 mg Oral Once Zoeya Gramajo R, MD       montelukast  (SINGULAIR ) tablet  10 mg  10 mg Oral Once Jaiyah Beining R, MD       PHYSICAL EXAMINATION:   Vitals:   11/02/23 0939  BP: 112/77  Pulse:  79  Resp: 19  Temp: (!) 97.1 F (36.2 C)  SpO2: 99%     Filed Weights   11/02/23 0939  Weight: 250 lb 3.2 oz (113.5 kg)     Positive for bilateral gynecomastia.    Physical Exam Vitals and nursing note reviewed.  HENT:     Head: Normocephalic and atraumatic.     Mouth/Throat:     Pharynx: Oropharynx is clear.  Eyes:     Extraocular Movements: Extraocular movements intact.     Pupils: Pupils are equal, round, and reactive to light.  Cardiovascular:     Rate and Rhythm: Normal rate and regular rhythm.  Abdominal:     Palpations: Abdomen is soft.  Musculoskeletal:        General: Normal range of motion.     Cervical back: Normal range of motion.  Skin:    General: Skin is warm.  Neurological:     General: No focal deficit present.     Mental Status: He is alert and oriented to person, place, and time.  Psychiatric:        Behavior: Behavior normal.        Judgment: Judgment normal.        LABORATORY DATA:  I have reviewed the data as listed Lab Results  Component Value Date   WBC 7.0 11/02/2023   HGB 13.2 11/02/2023   HCT 39.2 11/02/2023   MCV 88.9 11/02/2023   PLT 89 (L) 11/02/2023   Recent Labs    10/19/23 0844 10/26/23 1009 11/02/23 0919  NA 138 134* 136  K 4.7 4.3 4.2  CL 103 100 104  CO2 28 27 24   GLUCOSE 109* 104* 122*  BUN 12 17 19   CREATININE 0.66 0.87 0.82  CALCIUM 8.2* 8.0* 8.1*  GFRNONAA >60 >60 >60  PROT 5.6* 5.2* 5.3*  ALBUMIN 3.5 3.3* 3.2*  AST 16 14* 17  ALT 17 24 30   ALKPHOS 49 44 40  BILITOT 0.8 0.7 1.0   CUP PACEART REMOTE DEVICE CHECK Result Date: 10/11/2023 ILR summary report received. Battery status OK. Normal device function. No new symptom, tachy, brady, or pause episodes. 9 new AF episodes, burden 99.9%, EGMs consistent with AF, known history, LAA ligation per PA note. Monthly summary  reports and ROV/PRN - CS, CVRS  ECHOCARDIOGRAM COMPLETE Result Date: 10/11/2023    ECHOCARDIOGRAM REPORT   Patient Name:   Jimmy [name] Date of Exam: 10/11/2023 Medical Rec #:  [id number]        Height:       71.0 in Accession #:    [id number]       Weight:       242.0 lb Date of Birth:  Mar 24, 1956        BSA:          2.286 m Patient Age:    67 years         BP:           116/64 mmHg Patient Gender: M                HR:           89 bpm. Exam Location:  [address] Procedure: 2D Echo (Both Spectral and Color Flow Doppler were utilized during            procedure). Indications:    I48.1 Persistent atrial fibrillation  History:        Patient has prior history of Echocardiogram examinations, most  recent 08/11/2021. Risk Factors:Dyslipidemia. Abnormal glucose.                 Multiple myeloma.  Sonographer:    Jon Hacker RCS Referring Phys: [id number] OLE DASEN LAMBERT IMPRESSIONS  1. Left ventricular ejection fraction, by estimation, is 55 to 60%. The left ventricle has normal function. The left ventricle has no regional wall motion abnormalities. There is mild left ventricular hypertrophy. Left ventricular diastolic parameters are indeterminate.  2. Right ventricular systolic function is low normal. The right ventricular size is normal.  3. The mitral valve is normal in structure. Trivial mitral valve regurgitation.  4. The aortic valve is tricuspid. Aortic valve regurgitation is not visualized. No aortic stenosis is present. FINDINGS  Left Ventricle: Left ventricular ejection fraction, by estimation, is 55 to 60%. The left ventricle has normal function. The left ventricle has no regional wall motion abnormalities. The left ventricular internal cavity size was normal in size. There is  mild left ventricular hypertrophy. Left ventricular diastolic parameters are indeterminate. Right Ventricle: The right ventricular size is normal. No increase in right ventricular wall thickness. Right  ventricular systolic function is low normal. Left Atrium: Left atrial size was normal in size. Right Atrium: Right atrial size was normal in size. Pericardium: There is no evidence of pericardial effusion. Mitral Valve: The mitral valve is normal in structure. Trivial mitral valve regurgitation. Tricuspid Valve: The tricuspid valve is normal in structure. Tricuspid valve regurgitation is trivial. Aortic Valve: The aortic valve is tricuspid. Aortic valve regurgitation is not visualized. No aortic stenosis is present. Pulmonic Valve: The pulmonic valve was not well visualized. Pulmonic valve regurgitation is trivial. Aorta: The aortic root and ascending aorta are structurally normal, with no evidence of dilitation. IAS/Shunts: The interatrial septum was not well visualized.  LEFT VENTRICLE PLAX 2D LVIDd:         4.92 cm LVIDs:         3.32 cm LV PW:         1.30 cm LV IVS:        1.08 cm LVOT diam:     2.20 cm LV SV:         63 LV SV Index:   27 LVOT Area:     3.80 cm  RIGHT VENTRICLE RV Basal diam:  2.88 cm RV S prime:     10.63 cm/s TAPSE (M-mode): 1.5 cm LEFT ATRIUM             Index        RIGHT ATRIUM           Index LA diam:        5.00 cm 2.19 cm/m   RA Area:     15.10 cm LA Vol (A2C):   42.9 ml 18.76 ml/m  RA Volume:   31.40 ml  13.73 ml/m LA Vol (A4C):   50.2 ml 21.96 ml/m LA Biplane Vol: 48.5 ml 21.21 ml/m  AORTIC VALVE LVOT Vmax:   87.13 cm/s LVOT Vmean:  60.100 cm/s LVOT VTI:    0.165 m  AORTA Ao Root diam: 3.30 cm Ao Asc diam:  3.40 cm TRICUSPID VALVE TR Peak grad:   19.5 mmHg TR Vmax:        221.00 cm/s  SHUNTS Systemic VTI:  0.17 m Systemic Diam: 2.20 cm Lonni Nanas MD Electronically signed by Lonni Nanas MD Signature Date/Time: 10/11/2023/3:33:27 PM    Final      Lab Results  Component Value Date  KPAFRELGTCHN 6.2 10/26/2023   KPAFRELGTCHN 5.5 10/19/2023   KPAFRELGTCHN 5.6 09/21/2023   LAMBDASER 3.9 (L) 10/26/2023   LAMBDASER 3.2 (L) 10/19/2023   LAMBDASER 3.7 (L)  09/21/2023   KAPLAMBRATIO 1.59 10/26/2023   KAPLAMBRATIO 1.72 (H) 10/19/2023   KAPLAMBRATIO 1.51 09/21/2023     Multiple myeloma in relapse (HCC) # LIGHT CHAIN MONOCLONAL GAMMOPATHY -DEC 2024-[PCP-SPEP-NEGATIVE; Hb 13; GFR-> 60 ca-WN; However, random UPEP- positive for M protein/Bence-Jones- Protein positive; kappa type.  JAN 2025- SPEP- NEG; K/l= 91 [K=400]. FEB 2025- Bone marrow- BONE MARROW, ASPIRATE, CLOT, CORE:  -  Kappa restricted plasma cell neoplasm involving 50 to 90% of the  cellular marrow (patchy areas; average 70%)   cytogenetics: 11:14; 13 q- [standard risk] Negative Congo stain. MARCH 2025-PET Numerous lytic myelomatous bone lesions throughout the axial and appendicular skeleton noted on the CT scan without associated discrete significant hypermetabolism.   S/P evaluation with Dr.Choi [facility]- bone marrow evaluation]. Discussed with Dr.Choi. TRANSPLANT ELIGIBLE. 4/25- Currently on Dara-RVD [q 21 d cycle- Griffin trial.   # Proceed with cycle number 5-day 1- Dara-Rev-Dex [ [q 21 d cycle- Griffin trial-; Revlimid  at 20 mg [dose reduced sec  rash].   [facility] on aug 27th, 2025-  bone marrow biopsy/MRD testing. Discussed with Dr.Choi. Awaiting bone marrow harvesting- end of the month.  Patient is interested in stem cell harvest however is currently awaiting to make decisions on transplant based on MRD results testing.  I have reached out to  Powell Mania at [facility] coordinate the care of this patient.  #  rash on the face- improved  with rev- 20 mg. Monitor closely.   # Multiple bone lesion-- [per pt s/p dental clearance] -Continue ca+Vit D BID. [Vit D 1000/d; 5000 q OD]. vit D 28 [June 2025]-recommend 50,000 units vitamin D  once a week; and recommend calcium 1000 mg once a day- Stable. Zometa   # History of a flutter [s/p ablation in [place] - clip of Left atrial appendage]-no anticoagulation- on asprin; irregular rhythm-2D echo pending.  Stable.  # Bilateral gynecomastia-secondary to exogenous  estrogens- Stable.  # DVT-ID Prophylaxis: asprin/acyclovir -  Stable.  # ACP: had Advance directives.   # # IV access:PIV     # 1-4 cycles-SQ Dara-velcade  weekly x4 cycles; 5-6 dara-q2 w; Lavern; Zometa-??   MD appt- q 3W  Pre-meds-?PS  # DISPOSITION: # q 4 weeks- MM; K/L light chains # chemo today; and as per IS # HOLD Zometa- # follow up TBD-  Dr.B   All questions were answered. The patient knows to call the clinic with any problems, questions or concerns.    Cindy JONELLE Joe, MD 11/02/2023 11:06 AM

## 2023-11-02 NOTE — Telephone Encounter (Signed)
 Reached out to Transplant RN Coordinator at [facility]; Powell Mania [phone]. Left VM for her to Contact Dr Rennie to coordinate care for this patient. Dr Rona cell number as well as my desk phone number left on VM.

## 2023-11-02 NOTE — Assessment & Plan Note (Addendum)
#   LIGHT CHAIN MONOCLONAL GAMMOPATHY -DEC 2024-[PCP-SPEP-NEGATIVE; Hb 13; GFR-> 60 ca-WN; However, random UPEP- positive for M protein/Bence-Jones- Protein positive; kappa type.  JAN 2025- SPEP- NEG; K/l= 91 [K=400]. FEB 2025- Bone marrow- BONE MARROW, ASPIRATE, CLOT, CORE:  -  Kappa restricted plasma cell neoplasm involving 50 to 90% of the  cellular marrow (patchy areas; average 70%)   cytogenetics: 11:14; 13 q- [standard risk] Negative Congo stain. MARCH 2025-PET Numerous lytic myelomatous bone lesions throughout the axial and appendicular skeleton noted on the CT scan without associated discrete significant hypermetabolism.   S/P evaluation with Dr.Choi [facility]- bone marrow evaluation]. Discussed with Dr.Choi. TRANSPLANT ELIGIBLE. 4/25- Currently on Dara-RVD [q 21 d cycle- Griffin trial.   # Proceed with cycle number 5-day 1- Dara-Rev-Dex [ [q 21 d cycle- Griffin trial-; Revlimid  at 20 mg [dose reduced sec  rash].   [facility] on aug 27th, 2025-  bone marrow biopsy/MRD testing. Discussed with Dr.Choi. Awaiting bone marrow harvesting- end of the month.  Patient is interested in stem cell harvest however is currently awaiting to make decisions on transplant based on MRD results testing.  I have reached out to  Powell Mania at [facility] coordinate the care of this patient.  #  rash on the face- improved  with rev- 20 mg. Monitor closely.   # Multiple bone lesion-- [per pt s/p dental clearance] -Continue ca+Vit D BID. [Vit D 1000/d; 5000 q OD]. vit D 28 [June 2025]-recommend 50,000 units vitamin D  once a week; and recommend calcium 1000 mg once a day- Stable. Zometa   # History of a flutter [s/p ablation in [place] - clip of Left atrial appendage]-no anticoagulation- on asprin; irregular rhythm-2D echo pending.  Stable.  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  # DVT-ID Prophylaxis: asprin/acyclovir -  Stable.  # ACP: had Advance directives.   # # IV access:PIV     # 1-4 cycles-SQ  Dara-velcade  weekly x4 cycles; 5-6 dara-q2 w; Lavern; Zometa-??   MD appt- q 3W  Pre-meds-?PS  # DISPOSITION: # q 4 weeks- MM; K/L light chains # chemo today; and as per IS # HOLD Zometa- # follow up TBD-  Dr.B

## 2023-11-02 NOTE — Progress Notes (Signed)
 Patient presents well today with no new or acute concerns.

## 2023-11-09 ENCOUNTER — Other Ambulatory Visit: Payer: Self-pay | Admitting: Internal Medicine

## 2023-11-09 ENCOUNTER — Encounter: Payer: Self-pay | Admitting: Internal Medicine

## 2023-11-09 ENCOUNTER — Inpatient Hospital Stay

## 2023-11-09 ENCOUNTER — Ambulatory Visit: Admitting: Internal Medicine

## 2023-11-09 ENCOUNTER — Ambulatory Visit

## 2023-11-09 ENCOUNTER — Telehealth: Payer: Self-pay | Admitting: *Deleted

## 2023-11-09 VITALS — BP 130/77 | HR 88 | Temp 98.8°F | Resp 18 | Wt 257.7 lb

## 2023-11-09 DIAGNOSIS — C9002 Multiple myeloma in relapse: Secondary | ICD-10-CM

## 2023-11-09 DIAGNOSIS — Z5112 Encounter for antineoplastic immunotherapy: Secondary | ICD-10-CM | POA: Diagnosis not present

## 2023-11-09 LAB — CMP (CANCER CENTER ONLY)
ALT: 24 U/L (ref 0–44)
AST: 18 U/L (ref 15–41)
Albumin: 3.1 g/dL — ABNORMAL LOW (ref 3.5–5.0)
Alkaline Phosphatase: 42 U/L (ref 38–126)
Anion gap: 8 (ref 5–15)
BUN: 17 mg/dL (ref 8–23)
CO2: 26 mmol/L (ref 22–32)
Calcium: 8.5 mg/dL — ABNORMAL LOW (ref 8.9–10.3)
Chloride: 104 mmol/L (ref 98–111)
Creatinine: 0.8 mg/dL (ref 0.61–1.24)
GFR, Estimated: >60 mL/min (ref >60)
Glucose, Bld: 106 mg/dL — ABNORMAL HIGH (ref 70–99)
Potassium: 4 mmol/L (ref 3.5–5.1)
Sodium: 138 mmol/L (ref 135–145)
Total Bilirubin: 0.9 mg/dL (ref 0.0–1.2)
Total Protein: 5.2 g/dL — ABNORMAL LOW (ref 6.5–8.1)

## 2023-11-09 LAB — CBC WITH DIFFERENTIAL (CANCER CENTER ONLY)
Abs Immature Granulocytes: 0.1 K/uL — ABNORMAL HIGH (ref 0.00–0.07)
Basophils Absolute: 0 K/uL (ref 0.0–0.1)
Basophils Relative: 1 %
Eosinophils Absolute: 0.4 K/uL (ref 0.0–0.5)
Eosinophils Relative: 6 %
HCT: 36.9 % — ABNORMAL LOW (ref 39.0–52.0)
Hemoglobin: 12.3 g/dL — ABNORMAL LOW (ref 13.0–17.0)
Immature Granulocytes: 2 %
Lymphocytes Relative: 11 %
Lymphs Abs: 0.7 K/uL (ref 0.7–4.0)
MCH: 29.9 pg (ref 26.0–34.0)
MCHC: 33.3 g/dL (ref 30.0–36.0)
MCV: 89.6 fL (ref 80.0–100.0)
Monocytes Absolute: 0.5 K/uL (ref 0.1–1.0)
Monocytes Relative: 9 %
Neutro Abs: 4.4 K/uL (ref 1.7–7.7)
Neutrophils Relative %: 71 %
Platelet Count: 162 K/uL (ref 150–400)
RBC: 4.12 MIL/uL — ABNORMAL LOW (ref 4.22–5.81)
RDW: 15.9 % — ABNORMAL HIGH (ref 11.5–15.5)
WBC Count: 6.1 K/uL (ref 4.0–10.5)
nRBC: 0 % (ref 0.0–0.2)

## 2023-11-09 MED ORDER — PROCHLORPERAZINE MALEATE 10 MG PO TABS: 10.0000 mg | ORAL_TABLET | Freq: Once | ORAL | Status: DC

## 2023-11-09 MED ORDER — ACETAMINOPHEN 325 MG PO TABS: 650.0000 mg | ORAL_TABLET | Freq: Once | ORAL | Status: DC

## 2023-11-09 MED ORDER — DEXAMETHASONE 4 MG PO TABS: 20.0000 mg | ORAL_TABLET | Freq: Once | ORAL | Status: DC

## 2023-11-09 MED ORDER — MONTELUKAST SODIUM 10 MG PO TABS: 10.0000 mg | ORAL_TABLET | Freq: Every day | ORAL | Status: DC

## 2023-11-09 MED ORDER — BORTEZOMIB CHEMO SQ INJECTION 3.5 MG (2.5MG/ML)
1.3000 mg/m2 | Freq: Once | INTRAMUSCULAR | Status: AC
  Administered 2023-11-09: 3 mg via SUBCUTANEOUS
  Filled 2023-11-09: qty 1.2

## 2023-11-09 MED ORDER — DIPHENHYDRAMINE HCL 25 MG PO CAPS: 25.0000 mg | ORAL_CAPSULE | Freq: Once | ORAL | Status: DC

## 2023-11-09 NOTE — Telephone Encounter (Signed)
 I called patient and spoke to him at home. I explained that he completed 4 cycles of his chemo. He continues to get velcade  weekly. Dara will be every 3 weeks. So next week he does not need to take his pre meds. The next dara would be 11/23/23.   Per conversation with  Dr. Rennie.  Plan is to do this cycle and then patient will be having stem cell collection at [facility]. Were tentatively planning to start back treatment on September 13. Pt appreciated the call.

## 2023-11-09 NOTE — Progress Notes (Signed)
 Called Jimmy Mower, NP at P#  [phone]  per his request. He wanted to confirm plan for stem cell collection. Calendar requested. F# of [phone] provided. Faxed over calendar today and received a fax confirmation.

## 2023-11-11 ENCOUNTER — Ambulatory Visit

## 2023-11-11 ENCOUNTER — Ambulatory Visit (INDEPENDENT_AMBULATORY_CARE_PROVIDER_SITE_OTHER)

## 2023-11-11 DIAGNOSIS — I4819 Other persistent atrial fibrillation: Secondary | ICD-10-CM | POA: Diagnosis not present

## 2023-11-12 LAB — CUP PACEART REMOTE DEVICE CHECK
Date Time Interrogation Session: 20250810000619
Implantable Pulse Generator Implant Date: 20210518

## 2023-11-13 ENCOUNTER — Ambulatory Visit: Payer: Self-pay | Admitting: Cardiology

## 2023-11-16 ENCOUNTER — Inpatient Hospital Stay

## 2023-11-16 ENCOUNTER — Ambulatory Visit: Admitting: Internal Medicine

## 2023-11-16 VITALS — BP 116/69 | HR 78 | Temp 97.1°F | Resp 16 | Wt 251.3 lb

## 2023-11-16 DIAGNOSIS — C9002 Multiple myeloma in relapse: Secondary | ICD-10-CM

## 2023-11-16 DIAGNOSIS — Z5112 Encounter for antineoplastic immunotherapy: Secondary | ICD-10-CM | POA: Diagnosis not present

## 2023-11-16 LAB — CMP (CANCER CENTER ONLY)
ALT: 20 U/L (ref 0–44)
AST: 16 U/L (ref 15–41)
Albumin: 3.4 g/dL — ABNORMAL LOW (ref 3.5–5.0)
Alkaline Phosphatase: 41 U/L (ref 38–126)
Anion gap: 8 (ref 5–15)
BUN: 13 mg/dL (ref 8–23)
CO2: 26 mmol/L (ref 22–32)
Calcium: 8.9 mg/dL (ref 8.9–10.3)
Chloride: 103 mmol/L (ref 98–111)
Creatinine: 0.7 mg/dL (ref 0.61–1.24)
GFR, Estimated: >60 mL/min (ref >60)
Glucose, Bld: 120 mg/dL — ABNORMAL HIGH (ref 70–99)
Potassium: 3.8 mmol/L (ref 3.5–5.1)
Sodium: 137 mmol/L (ref 135–145)
Total Bilirubin: 1.1 mg/dL (ref 0.0–1.2)
Total Protein: 5.7 g/dL — ABNORMAL LOW (ref 6.5–8.1)

## 2023-11-16 LAB — CBC WITH DIFFERENTIAL (CANCER CENTER ONLY)
Abs Immature Granulocytes: 0.04 K/uL (ref 0.00–0.07)
Basophils Absolute: 0.1 K/uL (ref 0.0–0.1)
Basophils Relative: 1 %
Eosinophils Absolute: 0.3 K/uL (ref 0.0–0.5)
Eosinophils Relative: 4 %
HCT: 38.6 % — ABNORMAL LOW (ref 39.0–52.0)
Hemoglobin: 13.1 g/dL (ref 13.0–17.0)
Immature Granulocytes: 1 %
Lymphocytes Relative: 10 %
Lymphs Abs: 0.7 K/uL (ref 0.7–4.0)
MCH: 30 pg (ref 26.0–34.0)
MCHC: 33.9 g/dL (ref 30.0–36.0)
MCV: 88.5 fL (ref 80.0–100.0)
Monocytes Absolute: 0.8 K/uL (ref 0.1–1.0)
Monocytes Relative: 13 %
Neutro Abs: 4.6 K/uL (ref 1.7–7.7)
Neutrophils Relative %: 71 %
Platelet Count: 226 K/uL (ref 150–400)
RBC: 4.36 MIL/uL (ref 4.22–5.81)
RDW: 15.8 % — ABNORMAL HIGH (ref 11.5–15.5)
WBC Count: 6.5 K/uL (ref 4.0–10.5)
nRBC: 0 % (ref 0.0–0.2)

## 2023-11-16 MED ORDER — BORTEZOMIB CHEMO SQ INJECTION 3.5 MG (2.5MG/ML)
1.3000 mg/m2 | Freq: Once | INTRAMUSCULAR | Status: AC
  Administered 2023-11-16: 3 mg via SUBCUTANEOUS
  Filled 2023-11-16: qty 1.2

## 2023-11-16 MED ORDER — PROCHLORPERAZINE MALEATE 10 MG PO TABS
10.0000 mg | ORAL_TABLET | Freq: Once | ORAL | Status: DC
  Filled 2023-11-16: qty 1

## 2023-11-16 MED ORDER — MONTELUKAST SODIUM 10 MG PO TABS: 10.0000 mg | ORAL_TABLET | Freq: Every day | ORAL | Status: DC

## 2023-11-16 MED ORDER — ACETAMINOPHEN 325 MG PO TABS: 650.0000 mg | ORAL_TABLET | Freq: Once | ORAL | Status: DC

## 2023-11-16 MED ORDER — DIPHENHYDRAMINE HCL 25 MG PO CAPS: 25.0000 mg | ORAL_CAPSULE | Freq: Once | ORAL | Status: DC

## 2023-11-16 MED ORDER — DEXAMETHASONE 4 MG PO TABS: 20.0000 mg | ORAL_TABLET | Freq: Once | ORAL | Status: DC

## 2023-11-16 NOTE — Patient Instructions (Signed)
 Bortezomib  Injection What is this medication? BORTEZOMIB  (bor TEZ oh mib) treats lymphoma. It may also be used to treat multiple myeloma, a type of bone marrow cancer. It works by blocking a protein that causes cancer cells to grow and multiply. This helps to slow or stop the spread of cancer cells. This medicine may be used for other purposes; ask your health care provider or pharmacist if you have questions. COMMON BRAND NAME(S): BORUZU , Velcade  What should I tell my care team before I take this medication? They need to know if you have any of these conditions: Dehydration Diabetes Heart disease Liver disease Tingling of the fingers or toes or other nerve disorder An unusual or allergic reaction to bortezomib , other medications, foods, dyes, or preservatives If you or your partner are pregnant or trying to get pregnant Breastfeeding How should I use this medication? This medication is injected into a vein or under the skin. It is given by your care team in a hospital or clinic setting. Talk to your care team about the use of this medication in children. Special care may be needed. Overdosage: If you think you have taken too much of this medicine contact a poison control center or emergency room at once. NOTE: This medicine is only for you. Do not share this medicine with others. What if I miss a dose? Keep appointments for follow-up doses. It is important not to miss your dose. Call your care team if you are unable to keep an appointment. What may interact with this medication? Ketoconazole Rifampin This list may not describe all possible interactions. Give your health care provider a list of all the medicines, herbs, non-prescription drugs, or dietary supplements you use. Also tell them if you smoke, drink alcohol, or use illegal drugs. Some items may interact with your medicine. What should I watch for while using this medication? Your condition will be monitored carefully while you  are receiving this medication. You may need blood work while taking this medication. This medication may affect your coordination, reaction time, or judgment. Do not drive or operate machinery until you know how this medication affects you. Sit up or stand slowly to reduce the risk of dizzy or fainting spells. Drinking alcohol with this medication can increase the risk of these side effects. This medication may increase your risk of getting an infection. Call your care team for advice if you get a fever, chills, sore throat, or other symptoms of a cold or flu. Do not treat yourself. Try to avoid being around people who are sick. Check with your care team if you have severe diarrhea, nausea, and vomiting, or if you sweat a lot. The loss of too much body fluid may make it dangerous for you to take this medication. Talk to your care team if you may be pregnant. Serious birth defects can occur if you take this medication during pregnancy and for 7 months after the last dose. You will need a negative pregnancy test before starting this medication. Contraception is recommended while taking this medication and for 7 months after the last dose. Your care team can help you find the option that works for you. If your partner can get pregnant, use a condom during sex while taking this medication and for 4 months after the last dose. Do not breastfeed while taking this medication and for 2 months after the last dose. This medication may cause infertility. Talk to your care team if you are concerned about your fertility. What side  effects may I notice from receiving this medication? Side effects that you should report to your care team as soon as possible: Allergic reactions--skin rash, itching, hives, swelling of the face, lips, tongue, or throat Bleeding--bloody or black, tar-like stools, vomiting blood or brown material that looks like coffee grounds, red or dark brown urine, small red or purple spots on skin,  unusual bruising or bleeding Bleeding in the brain--severe headache, stiff neck, confusion, dizziness, change in vision, numbness or weakness of the face, arm, or leg, trouble speaking, trouble walking, vomiting Bowel blockage--stomach cramping, unable to have a bowel movement or pass gas, loss of appetite, vomiting Heart failure--shortness of breath, swelling of the ankles, feet, or hands, sudden weight gain, unusual weakness or fatigue Infection--fever, chills, cough, sore throat, wounds that don't heal, pain or trouble when passing urine, general feeling of discomfort or being unwell Liver injury--right upper belly pain, loss of appetite, nausea, light-colored stool, dark yellow or brown urine, yellowing skin or eyes, unusual weakness or fatigue Low blood pressure--dizziness, feeling faint or lightheaded, blurry vision Lung injury--shortness of breath or trouble breathing, cough, spitting up blood, chest pain, fever Pain, tingling, or numbness in the hands or feet Severe or prolonged diarrhea Stomach pain, bloody diarrhea, pale skin, unusual weakness or fatigue, decrease in the amount of urine, which may be signs of hemolytic uremic syndrome Sudden and severe headache, confusion, change in vision, seizures, which may be signs of posterior reversible encephalopathy syndrome (PRES) TTP--purple spots on the skin or inside the mouth, pale skin, yellowing skin or eyes, unusual weakness or fatigue, fever, fast or irregular heartbeat, confusion, change in vision, trouble speaking, trouble walking Tumor lysis syndrome (TLS)--nausea, vomiting, diarrhea, decrease in the amount of urine, dark urine, unusual weakness or fatigue, confusion, muscle pain or cramps, fast or irregular heartbeat, joint pain Side effects that usually do not require medical attention (report to your care team if they continue or are bothersome): Constipation Diarrhea Fatigue Loss of appetite Nausea This list may not describe all  possible side effects. Call your doctor for medical advice about side effects. You may report side effects to FDA at 1-800-FDA-1088. Where should I keep my medication? This medication is given in a hospital or clinic. It will not be stored at home. NOTE: This sheet is a summary. It may not cover all possible information. If you have questions about this medicine, talk to your doctor, pharmacist, or health care provider.  2024 Elsevier/Gold Standard (2021-08-23 00:00:00)

## 2023-11-16 NOTE — Progress Notes (Signed)
 Pt not getting daratumumab  today. Pre-medications still active on treatment plan. Pt states he was told by nurse not to take any of his pre-meds today. Clarified with Dr. Rennie if pre-meds listed on treatment plan were necessary. Per Dr. KATHEE, pt is to take dex and compazine . Discussed with pt. He states that he will take his dex at home and he does not need nausea medication. See E-mar.

## 2023-11-19 LAB — MULTIPLE MYELOMA PANEL, SERUM
Albumin SerPl Elph-Mcnc: 3.1 g/dL (ref 2.9–4.4)
Albumin/Glob SerPl: 1.5 (ref 0.7–1.7)
Alpha 1: 0.2 g/dL (ref 0.0–0.4)
Alpha2 Glob SerPl Elph-Mcnc: 0.8 g/dL (ref 0.4–1.0)
B-Globulin SerPl Elph-Mcnc: 0.9 g/dL (ref 0.7–1.3)
Gamma Glob SerPl Elph-Mcnc: 0.2 g/dL — ABNORMAL LOW (ref 0.4–1.8)
Globulin, Total: 2.1 g/dL — ABNORMAL LOW (ref 2.2–3.9)
IgA: 16 mg/dL — ABNORMAL LOW (ref 61–437)
IgG (Immunoglobin G), Serum: 306 mg/dL — ABNORMAL LOW (ref 603–1613)
IgM (Immunoglobulin M), Srm: 8 mg/dL — ABNORMAL LOW (ref 20–172)
M Protein SerPl Elph-Mcnc: 0.1 g/dL — ABNORMAL HIGH
Total Protein ELP: 5.2 g/dL — ABNORMAL LOW (ref 6.0–8.5)

## 2023-11-19 LAB — KAPPA/LAMBDA LIGHT CHAINS
Kappa free light chain: 5.9 mg/L (ref 3.3–19.4)
Kappa, lambda light chain ratio: 1.44 (ref 0.26–1.65)
Lambda free light chains: 4.1 mg/L — ABNORMAL LOW (ref 5.7–26.3)

## 2023-11-29 ENCOUNTER — Encounter: Payer: Self-pay | Admitting: Internal Medicine

## 2023-11-30 ENCOUNTER — Inpatient Hospital Stay

## 2023-11-30 NOTE — Telephone Encounter (Signed)
 2nd jug given to pt by the lab.

## 2023-12-12 ENCOUNTER — Encounter

## 2023-12-12 ENCOUNTER — Encounter: Payer: Self-pay | Admitting: Cardiology

## 2023-12-13 ENCOUNTER — Ambulatory Visit (INDEPENDENT_AMBULATORY_CARE_PROVIDER_SITE_OTHER)

## 2023-12-13 DIAGNOSIS — I4819 Other persistent atrial fibrillation: Secondary | ICD-10-CM

## 2023-12-13 LAB — CUP PACEART REMOTE DEVICE CHECK
Date Time Interrogation Session: 20250910230943
Implantable Pulse Generator Implant Date: 20210518

## 2023-12-14 ENCOUNTER — Other Ambulatory Visit: Payer: Self-pay

## 2023-12-14 ENCOUNTER — Encounter: Payer: Self-pay | Admitting: Sports Medicine

## 2023-12-14 ENCOUNTER — Ambulatory Visit: Payer: Self-pay | Admitting: Cardiology

## 2023-12-17 ENCOUNTER — Encounter: Payer: Self-pay | Admitting: Internal Medicine

## 2023-12-17 ENCOUNTER — Other Ambulatory Visit: Payer: Self-pay | Admitting: *Deleted

## 2023-12-17 ENCOUNTER — Other Ambulatory Visit: Payer: Self-pay | Admitting: Internal Medicine

## 2023-12-17 DIAGNOSIS — C9002 Multiple myeloma in relapse: Secondary | ICD-10-CM

## 2023-12-17 NOTE — Progress Notes (Signed)
 Please schedule for next Friday- MD: labs- cbc/cmp; MM panel; k/l light chains- chemo  GB

## 2023-12-18 ENCOUNTER — Encounter: Payer: Self-pay | Admitting: Internal Medicine

## 2023-12-18 DIAGNOSIS — R972 Elevated prostate specific antigen [PSA]: Secondary | ICD-10-CM

## 2023-12-20 ENCOUNTER — Encounter: Payer: Self-pay | Admitting: Internal Medicine

## 2023-12-21 NOTE — Progress Notes (Signed)
 Remote Loop Recorder Transmission

## 2023-12-25 ENCOUNTER — Encounter: Payer: Self-pay | Admitting: Internal Medicine

## 2023-12-27 ENCOUNTER — Other Ambulatory Visit: Payer: Self-pay

## 2023-12-27 DIAGNOSIS — C9000 Multiple myeloma not having achieved remission: Secondary | ICD-10-CM

## 2023-12-27 MED ORDER — LENALIDOMIDE 20 MG PO CAPS: 20.0000 mg | ORAL_CAPSULE | Freq: Every day | ORAL | 0 refills | Status: DC

## 2023-12-27 NOTE — Progress Notes (Signed)
 Remote Loop Recorder Transmission

## 2023-12-28 ENCOUNTER — Inpatient Hospital Stay: Attending: Internal Medicine

## 2023-12-28 ENCOUNTER — Inpatient Hospital Stay

## 2023-12-28 ENCOUNTER — Inpatient Hospital Stay (HOSPITAL_BASED_OUTPATIENT_CLINIC_OR_DEPARTMENT_OTHER): Admitting: Internal Medicine

## 2023-12-28 VITALS — BP 101/77 | HR 97 | Temp 98.5°F | Resp 16 | Ht 71.0 in | Wt 249.4 lb

## 2023-12-28 VITALS — BP 111/64 | HR 84

## 2023-12-28 DIAGNOSIS — C9002 Multiple myeloma in relapse: Secondary | ICD-10-CM | POA: Diagnosis present

## 2023-12-28 DIAGNOSIS — Z5112 Encounter for antineoplastic immunotherapy: Secondary | ICD-10-CM | POA: Insufficient documentation

## 2023-12-28 DIAGNOSIS — T451X5A Adverse effect of antineoplastic and immunosuppressive drugs, initial encounter: Secondary | ICD-10-CM | POA: Diagnosis not present

## 2023-12-28 DIAGNOSIS — E559 Vitamin D deficiency, unspecified: Secondary | ICD-10-CM | POA: Diagnosis not present

## 2023-12-28 DIAGNOSIS — D801 Nonfamilial hypogammaglobulinemia: Secondary | ICD-10-CM | POA: Diagnosis not present

## 2023-12-28 DIAGNOSIS — L270 Generalized skin eruption due to drugs and medicaments taken internally: Secondary | ICD-10-CM | POA: Diagnosis not present

## 2023-12-28 LAB — CMP (CANCER CENTER ONLY)
ALT: 14 U/L (ref 0–44)
AST: 18 U/L (ref 15–41)
Albumin: 3.7 g/dL (ref 3.5–5.0)
Alkaline Phosphatase: 58 U/L (ref 38–126)
Anion gap: 9 (ref 5–15)
BUN: 11 mg/dL (ref 8–23)
CO2: 27 mmol/L (ref 22–32)
Calcium: 8.6 mg/dL — ABNORMAL LOW (ref 8.9–10.3)
Chloride: 102 mmol/L (ref 98–111)
Creatinine: 0.85 mg/dL (ref 0.61–1.24)
GFR, Estimated: >60 mL/min (ref >60)
Glucose, Bld: 123 mg/dL — ABNORMAL HIGH (ref 70–99)
Potassium: 4 mmol/L (ref 3.5–5.1)
Sodium: 138 mmol/L (ref 135–145)
Total Bilirubin: 1 mg/dL (ref 0.0–1.2)
Total Protein: 6.1 g/dL — ABNORMAL LOW (ref 6.5–8.1)

## 2023-12-28 LAB — CBC WITH DIFFERENTIAL (CANCER CENTER ONLY)
Abs Immature Granulocytes: 0.02 K/uL (ref 0.00–0.07)
Basophils Absolute: 0.1 K/uL (ref 0.0–0.1)
Basophils Relative: 1 %
Eosinophils Absolute: 0.1 K/uL (ref 0.0–0.5)
Eosinophils Relative: 2 %
HCT: 40.8 % (ref 39.0–52.0)
Hemoglobin: 13.4 g/dL (ref 13.0–17.0)
Immature Granulocytes: 0 %
Lymphocytes Relative: 9 %
Lymphs Abs: 0.6 K/uL — ABNORMAL LOW (ref 0.7–4.0)
MCH: 29.5 pg (ref 26.0–34.0)
MCHC: 32.8 g/dL (ref 30.0–36.0)
MCV: 89.7 fL (ref 80.0–100.0)
Monocytes Absolute: 0.6 K/uL (ref 0.1–1.0)
Monocytes Relative: 11 %
Neutro Abs: 4.7 K/uL (ref 1.7–7.7)
Neutrophils Relative %: 77 %
Platelet Count: 300 K/uL (ref 150–400)
RBC: 4.55 MIL/uL (ref 4.22–5.81)
RDW: 16.4 % — ABNORMAL HIGH (ref 11.5–15.5)
WBC Count: 6.1 K/uL (ref 4.0–10.5)
nRBC: 0 % (ref 0.0–0.2)

## 2023-12-28 MED ORDER — MONTELUKAST SODIUM 10 MG PO TABS: 10.0000 mg | ORAL_TABLET | Freq: Every day | ORAL | Status: DC

## 2023-12-28 MED ORDER — ACETAMINOPHEN 325 MG PO TABS: 650.0000 mg | ORAL_TABLET | Freq: Once | ORAL | Status: DC

## 2023-12-28 MED ORDER — BORTEZOMIB CHEMO SQ INJECTION 3.5 MG (2.5MG/ML)
1.3000 mg/m2 | Freq: Once | INTRAMUSCULAR | Status: AC
  Administered 2023-12-28: 3 mg via SUBCUTANEOUS
  Filled 2023-12-28: qty 1.2

## 2023-12-28 MED ORDER — DIPHENHYDRAMINE HCL 25 MG PO CAPS: 25.0000 mg | ORAL_CAPSULE | Freq: Once | ORAL | Status: DC

## 2023-12-28 MED ORDER — DARATUMUMAB-HYALURONIDASE-FIHJ 1800-30000 MG-UT/15ML ~~LOC~~ SOLN
1800.0000 mg | Freq: Once | SUBCUTANEOUS | Status: AC
  Administered 2023-12-28: 1800 mg via SUBCUTANEOUS
  Filled 2023-12-28: qty 15

## 2023-12-28 MED ORDER — DEXAMETHASONE 4 MG PO TABS: 20.0000 mg | ORAL_TABLET | Freq: Once | ORAL | Status: DC

## 2023-12-28 NOTE — Progress Notes (Signed)
 Patient refused to wait post monitoring of chemo injection

## 2023-12-28 NOTE — Progress Notes (Signed)
 Patient refused pre-meds states he already has taken at home.

## 2023-12-28 NOTE — Progress Notes (Signed)
 New rx metoprolol for afib by [facility]. PET and BMB at [facility].  Clonoseq scanned in.

## 2023-12-28 NOTE — Assessment & Plan Note (Addendum)
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   S/P evaluation with Dr.Choi [facility]- bone marrow evaluation]. TRANSPLANT ELIGIBLE. 4/25- Currently on Dara-RVD [q 21 d cycle- Griffin trial-status post 5 cycles-bone marrow biopsy [[facility]]-November 28, 2023-MRD -LOW POSITIVE [2 cell/million].  Status post stem cell collection patient declines stem cell transplant.  Proceed with consolidation chemotherapy total of 9 cycles. [facility] SEP 2025- PET scan-negative for any acute myelomatous lesions in the bone.  # Proceed with cycle number 6 day 1- Dara-Rev-Dex [ [q 21 d cycle- Griffin trial-; Revlimid  at 20 mg [dose reduced sec  rash].    # Secondary hypogammaglobinemia-IVIG infusions every 3- 4 weeks.   #  rash on the face- improved  with rev- 20 mg. Monitor closely.    # 2025- PET Scan incidental-  Some ill-defined activity above blood pool is seen at the left inferior  aspect of the prostate. This is nonspecific but suspect prostatitis.  Recommend correlation with PSA values. If significantly elevated, then prostate MRI can be obtained for further assessment.  Currently awaiting urology evaluation.  # Multiple bone lesion-- [per pt s/p dental clearance] -Continue ca+Vit D BID. [Vit D 1000/d; 5000 q OD]. vit D 28 [June 2025]-recommend 50,000 units vitamin D  once a week; and recommend calcium 1000 mg once a day- Stable. Zometa   # History of a flutter [s/p ablation in [place] - clip of Left atrial appendage]-no anticoagulation- on asprin; irregular rhythm-2D echo pending.  Stable.  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  # DVT-ID Prophylaxis: asprin/acyclovir -  Stable.  # ACP: had Advance directives.   # # IV access:PIV   # 1-4 cycles-SQ Dara-velcade  weekly x4 cycles;  5-6 dara-q2 w; Lavern; Zometa-??   MD appt- q 3W  Pre-meds-?PS  # DISPOSITION: # q 4 weeks- MM; K/L light chains # chemo today; and as per IS #  HOLD Zometa- # IVIG q 3  weeks- x 3 times # as per IS- in next 3 weeks- with APP; - labs- check Vit D  levels--/chemo; possible zometa-  # as per IS- in next 6 weeks- with MD- - labs-/chemo Dr.B

## 2023-12-28 NOTE — Progress Notes (Signed)
 [facility] CONSULT NOTE  Patient Care Team: [name], MD as PCP - General (Internal Medicine) Cindie Ole DASEN, MD as PCP - Electrophysiology (Cardiology) Rennie Cindy SAUNDERS, MD as Consulting Physician (Oncology)  CHIEF COMPLAINTS/PURPOSE OF CONSULTATION: Multiple Myeloma   Oncology History Overview Note  # LIGHT CHAIN MONOCLONAL GAMMOPATHY -DEC 2024-[PCP-SPEP-NEGATIVE; Hb 13; GFR-> 60 ca-WN; However, random UPEP- positive for M protein/Bence-Jones- Protein positive; kappa type.  JAN 2025- SPEP- NEG; K/l= 91 [K=400]. Dx= includes smoldering MM vs active MM. 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)   06/05/2023 Cancer Staging   Staging form: Plasma Cell Myeloma and Plasma Cell Disorders, AJCC 8th Edition - Clinical: Albumin (g/dL): 4, ISS: Stage II, High-risk cytogenetics: Absent, LDH: Normal - Signed by Rennie Cindy SAUNDERS, MD on 06/05/2023 Albumin range (g/dL): Greater than or equal to 3.5 Cytogenetics: t(11;14) translocation   06/06/2023 Cancer Staging   Staging form: Plasma Cell Myeloma and Plasma Cell Disorders, AJCC 8th Edition - Clinical: RISS Stage I (Beta-2 -microglobulin (mg/L): 2.5, Albumin (g/dL): 4, ISS: Stage I, High-risk cytogenetics: Absent, LDH: Normal) - Signed by  Rennie Cindy SAUNDERS, MD on 06/06/2023 Stage prefix: Initial diagnosis Beta 2 microglobulin range (mg/L): Less than 3.5 Albumin range (g/dL): Greater than or equal to 3.5 Cytogenetics: t(11;14) translocation   07/27/2023 -  Chemotherapy   Patient is on Treatment Plan : MYELOMA NEWLY DIAGNOSED TRANSPLANT CANDIDATE DaraVRd (Daratumumab  SQ) with weekly bortezomib  (D1,8,15) q21d x 6 Cycles (Induction/Consolidation)      HISTORY OF PRESENTING ILLNESS: Patient ambulating-independently. Accompanied by wife.   Jimmy [name] 68 y.o.  male with A.fib [watchman device; not on anti-coagulation] standard risk- Multiple Myeloma [dx: march 2025] with multiple bone lesions-currently status post induction DARA-RVD-is here to proceed with consolidation Dara-RVD is here for a follow-up.   In the interim patient was admitted to [facility] for A-fib with RVR.  Currently rate controlled-with beta-blocker.  Patient also underwent bone marrow biopsy.  And also underwent a pheresis for stem cell collection.  Patient denies any neuropathy in his feet. Noted to rash resolved. NO swelling.  No falls.  In general appetite is good.  Denies any pain.  Patient is currently on vitamin D  supplementation.  Review of Systems  Constitutional:  Negative for chills, diaphoresis, fever and weight loss.  HENT:  Negative for nosebleeds and sore throat.   Eyes:  Negative for double vision.  Respiratory:  Negative for cough, hemoptysis, sputum production, shortness of breath and wheezing.   Cardiovascular:  Negative for chest pain, palpitations, orthopnea and leg swelling.  Gastrointestinal:  Negative for abdominal pain, blood in stool, constipation, diarrhea, heartburn, melena, nausea and vomiting.  Genitourinary:  Negative for dysuria, frequency and urgency.  Musculoskeletal:  Negative for back pain and joint pain.  Skin: Negative.  Negative for itching and rash.  Neurological:  Negative for dizziness, tingling,  focal weakness,  weakness and headaches.  Endo/Heme/Allergies:  Does not bruise/bleed easily.  Psychiatric/Behavioral:  Negative for depression. The patient is not nervous/anxious and does not have insomnia.     MEDICAL HISTORY:  Past Medical History:  Diagnosis Date   Atrial fibrillation (HCC)    had loop recorder, PAcs after DCCV 09/2019   Atrial flutter (HCC)    Fatigue    HLD (hyperlipidemia)    On amiodarone therapy 09/15/2019   Pain of right lower leg 01/02/2022    SURGICAL HISTORY: Past Surgical History:  Procedure Laterality Date   afib surgical ablation  2021   CARDIOVERSION     2021 for Afib in [place]   CHOLECYSTECTOMY     GALLBLADDER SURGERY  2006   IR BONE MARROW BIOPSY & ASPIRATION  05/18/2023   MINIMALLY INVASIVE MAZE PROCEDURE     done in [place]  Dr R. Gala   stye Right 09/2022    SOCIAL HISTORY: Social History   Socioeconomic History   Marital status: Married    Spouse name: Not on file   Number of children: Not on file   Years of education: Not on file   Highest education level: Bachelor's degree (e.g., BA, AB, BS)  Occupational History   Not on file  Tobacco Use   Smoking status: Never   Smokeless tobacco: Never  Vaping Use   Vaping status: Never Used  Substance and Sexual Activity   Alcohol use: Never   Drug use: Not Currently   Sexual activity: Not Currently  Other Topics Concern   Not on file  Social History Narrative   2 sons and 1 daughter    Married    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    No guns, wears seat belt, safe in relationship   Vegan x 8-9 years as of 07/06/20    Social Drivers of Corporate investment banker Strain: Low Risk  (12/28/2023)   Overall Financial Resource Strain (CARDIA)    Difficulty of Paying Living Expenses: Not hard at all  Food Insecurity: No Food Insecurity (12/28/2023)   Hunger Vital Sign    Worried About Running Out of Food in the Last Year: Never true    Ran Out of Food in the Last Year: Never true   Transportation Needs: No Transportation Needs (12/28/2023)   PRAPARE - Administrator, Civil Service (Medical): No    Lack of Transportation (Non-Medical): No  Physical Activity: Inactive (12/28/2023)   Exercise Vital Sign    Days of Exercise per Week: 0 days    Minutes of Exercise per Session: Not on file  Stress: No Stress Concern Present (12/28/2023)   [name]    Feeling of Stress: Not at all  Social Connections: Socially Isolated (12/28/2023)   Social Connection and Isolation Panel    Frequency of Communication with Friends and Family: Once a week    Frequency of Social Gatherings with Friends and Family: Never    Attends Religious Services: Never    Database administrator or Organizations: No    Attends Engineer, structural: Not on file    Marital Status: Married  Catering manager Violence: Not At Risk (04/16/2023)   Humiliation, Afraid, Rape, and Kick questionnaire    Fear of Current or Ex-Partner: No    Emotionally Abused: No    Physically Abused: No    Sexually Abused: No  FAMILY HISTORY: Family History  Problem Relation Age of Onset   COPD Mother    Atrial fibrillation Mother        ? due to Afib died 31   Cancer Father        lung cancer smoker age 84    Heart disease Father        bypass at 57    Atrial fibrillation Sister     ALLERGIES:  has no known allergies.  MEDICATIONS:  Current Outpatient Medications  Medication Sig Dispense Refill   acetaminophen  (TYLENOL ) 325 MG tablet Take 650 mg by mouth once a week. Take 1 hour prior to infusion appointment.     acyclovir  (ZOVIRAX ) 400 MG tablet Take 1 tablet (400 mg total) by mouth 2 (two) times daily. 180 tablet 1   ASPIRIN 81 PO Take 81 mg by mouth daily.     cyanocobalamin  1000 MCG tablet Take 2,000 mcg by mouth.     dexamethasone  (DECADRON ) 4 MG tablet Take 5 tabs (20mg ) by mouth 1 hour prior to weekly infusion  appointment and take 5 tabs (20mg ) the day after weekly infusion appointment. Take with with food . 30 tablet 0   diphenhydrAMINE  (BENADRYL ) 50 MG capsule Take 50 mg by mouth once a week. Take 1 hour prior to infusion appointment.     ergocalciferol  (VITAMIN D2) 1.25 MG (50000 UT) capsule Take 1 capsule (50,000 Units total) by mouth once a week. 12 capsule 1   hydrOXYzine  (ATARAX ) 10 MG tablet Take 1 tablet (10 mg total) by mouth 3 (three) times daily as needed. 30 tablet 0   lenalidomide  (REVLIMID ) 20 MG capsule Take 1 capsule (20 mg total) by mouth daily. Take for 14 days, then hold for 7 days. Repeat every 21 days. [[facility]] 14 capsule 0   metoprolol tartrate (LOPRESSOR) 25 MG tablet Take 25 mg by mouth 2 (two) times daily.     montelukast  (SINGULAIR ) 10 MG tablet Take daily; and on days of infusion- Take 1 hour prior to infusion appointment. 90 tablet 1   Multiple Vitamin (CALCIUM COMPLEX PO) Take by mouth daily.     Omega-3 1400 MG CAPS      ondansetron  (ZOFRAN ) 8 MG tablet One pill every 8 hours as needed for nausea/vomitting. 40 tablet 1   prochlorperazine  (COMPAZINE ) 10 MG tablet Take 1 tablet (10 mg total) by mouth every 6 (six) hours as needed for nausea or vomiting. 40 tablet 1   Turmeric (CURCUMIN 95) 500 MG CAPS Take 500 mg by mouth 2 (two) times daily.     Vitamin D -Vitamin K (VITAMIN K2-VITAMIN D3 PO) Take by mouth daily.     No current facility-administered medications for this visit.   PHYSICAL EXAMINATION:   Vitals:   12/28/23 0821  BP: 101/77  Pulse: 97  Resp: 16  Temp: 98.5 F (36.9 C)  SpO2: 96%     Filed Weights   12/28/23 0821  Weight: 249 lb 6.4 oz (113.1 kg)     Positive for bilateral gynecomastia.    Physical Exam Vitals and nursing note reviewed.  HENT:     Head: Normocephalic and atraumatic.     Mouth/Throat:     Pharynx: Oropharynx is clear.  Eyes:     Extraocular Movements: Extraocular movements intact.     Pupils: Pupils are equal, round,  and reactive to light.  Cardiovascular:     Rate and Rhythm: Normal rate and regular rhythm.  Abdominal:     Palpations: Abdomen is  soft.  Musculoskeletal:        General: Normal range of motion.     Cervical back: Normal range of motion.  Skin:    General: Skin is warm.  Neurological:     General: No focal deficit present.     Mental Status: He is alert and oriented to person, place, and time.  Psychiatric:        Behavior: Behavior normal.        Judgment: Judgment normal.        LABORATORY DATA:  I have reviewed the data as listed Lab Results  Component Value Date   WBC 6.1 12/28/2023   HGB 13.4 12/28/2023   HCT 40.8 12/28/2023   MCV 89.7 12/28/2023   PLT 300 12/28/2023   Recent Labs    11/09/23 0817 11/16/23 0818 12/28/23 0817  NA 138 137 138  K 4.0 3.8 4.0  CL 104 103 102  CO2 26 26 27   GLUCOSE 106* 120* 123*  BUN 17 13 11   CREATININE 0.80 0.70 0.85  CALCIUM 8.5* 8.9 8.6*  GFRNONAA >60 >60 >60  PROT 5.2* 5.7* 6.1*  ALBUMIN 3.1* 3.4* 3.7  AST 18 16 18   ALT 24 20 14   ALKPHOS 42 41 58  BILITOT 0.9 1.1 1.0   CUP PACEART REMOTE DEVICE CHECK Result Date: 12/13/2023 ILR summary report received. Battery status OK. Normal device function. No new symptom, tachy, brady, or pause episodes. Persistent AF, HR's recently trending up, no OAC, left atrial appendage clipped. Monthly summary reports and ROV/PRN LA, CVRS    Lab Results  Component Value Date   KPAFRELGTCHN 5.9 11/16/2023   KPAFRELGTCHN 6.2 10/26/2023   KPAFRELGTCHN 5.5 10/19/2023   LAMBDASER 4.1 (L) 11/16/2023   LAMBDASER 3.9 (L) 10/26/2023   LAMBDASER 3.2 (L) 10/19/2023   KAPLAMBRATIO 1.44 11/16/2023   KAPLAMBRATIO 1.59 10/26/2023   KAPLAMBRATIO 1.72 (H) 10/19/2023     Multiple myeloma in relapse (HCC) # DEC-JAN 2025- STANDARD RISK CYTOGENETICS LIGHT CHAIN MONOCLONAL GAMMOPATHY -DEC 2024-MARCH 2025-PET Numerous lytic myelomatous bone lesions throughout the axial and appendicular skeleton  noted on the CT scan without associated discrete significant hypermetabolism.   S/P evaluation with Dr.Choi [facility]- bone marrow evaluation]. TRANSPLANT ELIGIBLE. 4/25- Currently on Dara-RVD [q 21 d cycle- Griffin trial-status post 5 cycles-bone marrow biopsy [[facility]]-November 28, 2023-MRD -LOW POSITIVE [2 cell/million].  Status post stem cell collection patient declines stem cell transplant.  Proceed with consolidation chemotherapy total of 9 cycles. [facility] SEP 2025- PET scan-negative for any acute myelomatous lesions in the bone.  # Proceed with cycle number 6 day 1- Dara-Rev-Dex [ [q 21 d cycle- Griffin trial-; Revlimid  at 20 mg [dose reduced sec  rash].    # Secondary hypogammaglobinemia-IVIG infusions every 3- 4 weeks.   #  rash on the face- improved  with rev- 20 mg. Monitor closely.    # 2025- PET Scan incidental-  Some ill-defined activity above blood pool is seen at the left inferior  aspect of the prostate. This is nonspecific but suspect prostatitis.  Recommend correlation with PSA values. If significantly elevated, then prostate MRI can be obtained for further assessment.  Currently awaiting urology evaluation.  # Multiple bone lesion-- [per pt s/p dental clearance] -Continue ca+Vit D BID. [Vit D 1000/d; 5000 q OD]. vit D 28 [June 2025]-recommend 50,000 units vitamin D  once a week; and recommend calcium 1000 mg once a day- Stable. Zometa   # History of a flutter [s/p ablation in [place] - clip of Left atrial  appendage]-no anticoagulation- on asprin; irregular rhythm-2D echo pending.  Stable.  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  # DVT-ID Prophylaxis: asprin/acyclovir -  Stable.  # ACP: had Advance directives.   # # IV access:PIV   # 1-4 cycles-SQ Dara-velcade  weekly x4 cycles; 5-6 dara-q2 w; Lavern; Zometa-??   MD appt- q 3W  Pre-meds-?PS  # DISPOSITION: # q 4 weeks- MM; K/L light chains # chemo today; and as per IS #  HOLD Zometa- # IVIG q 4  weeks- x 3 times # as per IS- in next 3 weeks- with APP; - labs- check Vit D  levels--/chemo; possible zometa-  # as per IS- in next 6 weeks- with MD- - labs-/chemo Dr.B   All questions were answered. The patient knows to call the clinic with any problems, questions or concerns.    Cindy JONELLE Joe, MD 12/31/2023 1:15 PM

## 2023-12-31 ENCOUNTER — Encounter: Payer: Self-pay | Admitting: Internal Medicine

## 2023-12-31 ENCOUNTER — Other Ambulatory Visit: Payer: Self-pay | Admitting: Internal Medicine

## 2023-12-31 ENCOUNTER — Encounter: Payer: Self-pay | Admitting: Cardiology

## 2023-12-31 LAB — KAPPA/LAMBDA LIGHT CHAINS
Kappa free light chain: 4.8 mg/L (ref 3.3–19.4)
Kappa, lambda light chain ratio: 1.5 (ref 0.26–1.65)
Lambda free light chains: 3.2 mg/L — ABNORMAL LOW (ref 5.7–26.3)

## 2024-01-01 ENCOUNTER — Ambulatory Visit: Admitting: Sports Medicine

## 2024-01-01 ENCOUNTER — Encounter: Payer: Self-pay | Admitting: Sports Medicine

## 2024-01-01 ENCOUNTER — Other Ambulatory Visit: Payer: Self-pay | Admitting: Urology

## 2024-01-01 VITALS — BP 118/76 | HR 97 | Temp 96.8°F | Resp 17 | Ht 71.0 in | Wt 258.4 lb

## 2024-01-01 DIAGNOSIS — R948 Abnormal results of function studies of other organs and systems: Secondary | ICD-10-CM | POA: Diagnosis not present

## 2024-01-01 DIAGNOSIS — I4891 Unspecified atrial fibrillation: Secondary | ICD-10-CM | POA: Diagnosis not present

## 2024-01-01 DIAGNOSIS — M8580 Other specified disorders of bone density and structure, unspecified site: Secondary | ICD-10-CM

## 2024-01-01 DIAGNOSIS — R972 Elevated prostate specific antigen [PSA]: Secondary | ICD-10-CM

## 2024-01-01 DIAGNOSIS — N403 Nodular prostate with lower urinary tract symptoms: Secondary | ICD-10-CM

## 2024-01-01 DIAGNOSIS — C9002 Multiple myeloma in relapse: Secondary | ICD-10-CM | POA: Diagnosis not present

## 2024-01-01 LAB — MULTIPLE MYELOMA PANEL, SERUM
Albumin SerPl Elph-Mcnc: 3.2 g/dL (ref 2.9–4.4)
Albumin/Glob SerPl: 1.4 (ref 0.7–1.7)
Alpha 1: 0.2 g/dL (ref 0.0–0.4)
Alpha2 Glob SerPl Elph-Mcnc: 0.9 g/dL (ref 0.4–1.0)
B-Globulin SerPl Elph-Mcnc: 1 g/dL (ref 0.7–1.3)
Gamma Glob SerPl Elph-Mcnc: 0.2 g/dL — ABNORMAL LOW (ref 0.4–1.8)
Globulin, Total: 2.4 g/dL (ref 2.2–3.9)
IgA: 31 mg/dL — ABNORMAL LOW (ref 61–437)
IgG (Immunoglobin G), Serum: 243 mg/dL — ABNORMAL LOW (ref 603–1613)
IgM (Immunoglobulin M), Srm: 13 mg/dL — ABNORMAL LOW (ref 20–172)
M Protein SerPl Elph-Mcnc: 0.1 g/dL — ABNORMAL HIGH
Total Protein ELP: 5.6 g/dL — ABNORMAL LOW (ref 6.0–8.5)

## 2024-01-01 NOTE — Progress Notes (Unsigned)
 Jimmy [name]

## 2024-01-01 NOTE — Progress Notes (Unsigned)
 Careteam: Patient Care Team: Jimmy Madden, MD as PCP - General (Internal Medicine) Jimmy Ole DASEN, MD as PCP - Electrophysiology (Cardiology) Jimmy Cindy SAUNDERS, MD as Consulting Physician (Oncology)  PLACE OF SERVICE:  [facility]  Advanced Directive information Does Patient Have a Medical Advance Directive?: Yes, Type of Advance Directive: Healthcare Power of [name];Living will, Does patient want to make changes to medical advance directive?: No - Patient declined  No Known Allergies  Chief Complaint  Patient presents with   Medical Management of Chronic Issues    Routine visit. Discuss the need for Covid Booster.     Discussed the use of AI scribe software for clinical note transcription with the patient, who gave verbal consent to proceed.  History of Present Illness  Jimmy [name] is a 68 year old male who presents for follow-up   He is undergoing a prostate biopsy following a PET scan and a recent appointment with a urologist. He has no hematuria, urinary stream issues, or fevers. His appetite remains good, and he has not experienced any weight loss.    He recently had his stem cells harvested on December 14, 2023, during which he experienced atrial fibrillation with a high heart rate that has not yet returned to normal. He is currently on metoprolol 25 mg twice a day to manage his heart rate. No chest pain, shortness of breath, dizziness, or lightheadedness.  He is undergoing chemotherapy and reports no nausea or vomiting. He experiences mild, stable neuropathy in his feet but no balance issues or falls in the past year. He has had some leg swelling, which resolved with compression socks and was confirmed not to be due to blood clots via sonogram.  His current medications include aspirin 81 mg, B12, metoprolol 25 mg twice a day, acyclovir  as a prophylactic, and vitamin D  supplementation. He takes Benadryl  on the day of chemotherapy infusions. His  recent blood work shows good hemoglobin, white blood cell counts, and improved platelets. Sodium, potassium, and liver and kidney functions are normal, though calcium is slightly low.  He is scheduled to start IVIG on Thursday and has temporarily postponed a stem cell transplant. He has not yet received Zometa infusions due to low calcium levels.      Review of Systems:  Review of Systems  Constitutional:  Negative for chills and fever.  HENT:  Negative for congestion and sore throat.   Respiratory:  Negative for cough, sputum production and shortness of breath.   Cardiovascular:  Negative for chest pain, palpitations and leg swelling.  Gastrointestinal:  Negative for abdominal pain, heartburn and nausea.  Genitourinary:  Negative for dysuria.  Musculoskeletal:  Negative for falls.  Neurological:  Negative for dizziness.   Negative unless indicated in HPI.   Past Medical History:  Diagnosis Date   Atrial fibrillation (HCC)    had loop recorder, PAcs after DCCV 09/2019   Atrial flutter (HCC)    Fatigue    HLD (hyperlipidemia)    On amiodarone therapy 09/15/2019   Pain of right lower leg 01/02/2022   Past Surgical History:  Procedure Laterality Date   afib surgical ablation  2021   CARDIOVERSION     2021 for Afib in [place]   CHOLECYSTECTOMY     GALLBLADDER SURGERY  2006   IR BONE MARROW BIOPSY & ASPIRATION  05/18/2023   MINIMALLY INVASIVE MAZE PROCEDURE     done in [place]  Dr R. Gala   stye Right 09/2022   Social History:  reports that he has never smoked. He has never used smokeless tobacco. He reports that he does not currently use drugs. He reports that he does not drink alcohol.  Family History  Problem Relation Age of Onset   COPD Mother    Atrial fibrillation Mother        ? due to Afib died 14   Cancer Father        lung cancer smoker age 68    Heart disease Father        bypass at 58    Atrial fibrillation Sister     Medications: Patient's Medications  New  Prescriptions   No medications on file  Previous Medications   ACETAMINOPHEN  (TYLENOL ) 325 MG TABLET    Take 650 mg by mouth once a week. Take 1 hour prior to infusion appointment.   ACYCLOVIR  (ZOVIRAX ) 400 MG TABLET    Take 1 tablet (400 mg total) by mouth 2 (two) times daily.   ASPIRIN 81 PO    Take 81 mg by mouth daily.   CYANOCOBALAMIN  1000 MCG TABLET    Take 2,000 mcg by mouth.   DEXAMETHASONE  (DECADRON ) 4 MG TABLET    Take 5 tabs (20mg ) by mouth 1 hour prior to weekly infusion appointment and take 5 tabs (20mg ) the day after weekly infusion appointment. Take with with food .   DIPHENHYDRAMINE  (BENADRYL ) 50 MG CAPSULE    Take 50 mg by mouth once a week. Take 1 hour prior to infusion appointment.   ERGOCALCIFEROL  (VITAMIN D2) 1.25 MG (50000 UT) CAPSULE    Take 1 capsule (50,000 Units total) by mouth once a week.   HYDROXYZINE  (ATARAX ) 10 MG TABLET    Take 1 tablet (10 mg total) by mouth 3 (three) times daily as needed.   LENALIDOMIDE  (REVLIMID ) 20 MG CAPSULE    Take 1 capsule (20 mg total) by mouth daily. Take for 14 days, then hold for 7 days. Repeat every 21 days. [[facility]]   METOPROLOL TARTRATE (LOPRESSOR) 25 MG TABLET    Take 25 mg by mouth 2 (two) times daily.   MONTELUKAST  (SINGULAIR ) 10 MG TABLET    Take daily; and on days of infusion- Take 1 hour prior to infusion appointment.   MULTIPLE VITAMIN (CALCIUM COMPLEX PO)    Take by mouth daily.   OMEGA-3 1400 MG CAPS       ONDANSETRON  (ZOFRAN ) 8 MG TABLET    One pill every 8 hours as needed for nausea/vomitting.   PROCHLORPERAZINE  (COMPAZINE ) 10 MG TABLET    Take 1 tablet (10 mg total) by mouth every 6 (six) hours as needed for nausea or vomiting.   TURMERIC (CURCUMIN 95) 500 MG CAPS    Take 500 mg by mouth 2 (two) times daily.   VITAMIN D -VITAMIN K (VITAMIN K2-VITAMIN D3 PO)    Take by mouth daily.  Modified Medications   No medications on file  Discontinued Medications   No medications on file    Physical Exam: Vitals:   01/01/24  0904  BP: 118/76  Pulse: 97  Resp: 17  Temp: (!) 96.8 F (36 C)  SpO2: 98%  Weight: 258 lb 6.4 oz (117.2 kg)  Height: 5' 11 (1.803 m)   Body mass index is 36.04 kg/m. BP Readings from Last 3 Encounters:  01/01/24 118/76  12/28/23 111/64  12/28/23 101/77   Wt Readings from Last 3 Encounters:  01/01/24 258 lb 6.4 oz (117.2 kg)  12/28/23 249 lb 6.4 oz (113.1 kg)  11/16/23 251 lb 5.2 oz (114 kg)    Physical Exam Constitutional:      Appearance: Normal appearance.  HENT:     Head: Normocephalic and atraumatic.  Cardiovascular:     Rate and Rhythm: Normal rate and regular rhythm.     Pulses: Normal pulses.     Heart sounds: Normal heart sounds.  Pulmonary:     Effort: No respiratory distress.     Breath sounds: No stridor. No wheezing or rales.  Abdominal:     General: Bowel sounds are normal. There is no distension.     Palpations: Abdomen is soft.     Tenderness: There is no abdominal tenderness. There is no guarding.  Musculoskeletal:        General: No swelling.  Neurological:     Mental Status: He is alert. Mental status is at baseline.     Motor: No weakness.     Labs reviewed: Basic Metabolic Panel: Recent Labs    03/26/23 1535 04/16/23 1208 11/09/23 0817 11/16/23 0818 12/28/23 0817  NA 139   < > 138 137 138  K 4.3   < > 4.0 3.8 4.0  CL 103   < > 104 103 102  CO2 27   < > 26 26 27   GLUCOSE 93   < > 106* 120* 123*  BUN 7   < > 17 13 11   CREATININE 0.78   < > 0.80 0.70 0.85  CALCIUM 9.1   < > 8.5* 8.9 8.6*  TSH 3.72  --   --   --   --    < > = values in this interval not displayed.   Liver Function Tests: Recent Labs    11/09/23 0817 11/16/23 0818 12/28/23 0817  AST 18 16 18   ALT 24 20 14   ALKPHOS 42 41 58  BILITOT 0.9 1.1 1.0  PROT 5.2* 5.7* 6.1*  ALBUMIN 3.1* 3.4* 3.7   No results for input(s): LIPASE, AMYLASE in the last 8760 hours. No results for input(s): AMMONIA in the last 8760 hours. CBC: Recent Labs    11/09/23 0817  11/16/23 0818 12/28/23 0817  WBC 6.1 6.5 6.1  NEUTROABS 4.4 4.6 4.7  HGB 12.3* 13.1 13.4  HCT 36.9* 38.6* 40.8  MCV 89.6 88.5 89.7  PLT 162 226 300   Lipid Panel: Recent Labs    05/21/23 0949  CHOL 173  HDL 31.70*  LDLCALC 113*  TRIG 141.0  CHOLHDL 5   TSH: Recent Labs    03/26/23 1535  TSH 3.72   A1C: Lab Results  Component Value Date   HGBA1C 6.1 03/26/2023    Assessment and Plan Assessment & Plan   1. 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 Abnormal positron emission tomography (PET) scan  some ill-defined activity above blood pool is seen at the  inferior aspect of the prostate.  This is asymmetric involving the left  inferior portion of the gland  Pt followed with urology and is awaiting prostate biopsy  3. Atrial fibrillation, unspecified type (HCC)  As per chart review pt had ablation many yrs ago  Recent hospitalization for Afib  Cont with metoprolol Scheduled to see cardiology in 2 weeks

## 2024-01-02 ENCOUNTER — Encounter: Payer: Self-pay | Admitting: Sports Medicine

## 2024-01-02 ENCOUNTER — Other Ambulatory Visit: Payer: Self-pay

## 2024-01-02 MED ORDER — METOPROLOL TARTRATE 25 MG PO TABS: 25.0000 mg | ORAL_TABLET | Freq: Two times a day (BID) | ORAL | 2 refills | Status: DC

## 2024-01-02 NOTE — Telephone Encounter (Signed)
 RX sent in

## 2024-01-03 ENCOUNTER — Inpatient Hospital Stay: Attending: Internal Medicine

## 2024-01-03 VITALS — BP 111/75 | HR 87 | Temp 97.0°F | Resp 18

## 2024-01-03 DIAGNOSIS — D801 Nonfamilial hypogammaglobulinemia: Secondary | ICD-10-CM | POA: Insufficient documentation

## 2024-01-03 DIAGNOSIS — Z5112 Encounter for antineoplastic immunotherapy: Secondary | ICD-10-CM | POA: Diagnosis present

## 2024-01-03 DIAGNOSIS — C9002 Multiple myeloma in relapse: Secondary | ICD-10-CM | POA: Insufficient documentation

## 2024-01-03 DIAGNOSIS — Z23 Encounter for immunization: Secondary | ICD-10-CM | POA: Insufficient documentation

## 2024-01-03 MED ORDER — IMMUNE GLOBULIN (HUMAN) 10 GM/100ML IV SOLN
400.0000 mg/kg | Freq: Once | INTRAVENOUS | Status: AC
  Administered 2024-01-03: 35 g via INTRAVENOUS
  Filled 2024-01-03: qty 350

## 2024-01-03 MED ORDER — ACETAMINOPHEN 325 MG PO TABS: 650.0000 mg | ORAL_TABLET | Freq: Once | ORAL | Status: DC

## 2024-01-03 MED ORDER — DIPHENHYDRAMINE HCL 25 MG PO CAPS: 25.0000 mg | ORAL_CAPSULE | Freq: Once | ORAL | Status: DC

## 2024-01-03 MED ORDER — DEXTROSE 5 % IV SOLN
INTRAVENOUS | Status: DC
  Filled 2024-01-03: qty 250

## 2024-01-03 NOTE — Patient Instructions (Signed)
 Immune Globulin  Injection What is this medication? IMMUNE GLOBULIN  (im MUNE GLOB yoo lin) treats many immune system conditions. It works by Designer, multimedia extra antibodies. Antibodies are proteins made by the immune system that help protect the body. This medicine may be used for other purposes; ask your health care provider or pharmacist if you have questions. COMMON BRAND NAME(S): ASCENIV, Baygam, BIVIGAM, Carimune, Carimune NF, cutaquig, Cuvitru, Flebogamma, Flebogamma DIF, GamaSTAN, GamaSTAN S/D, Gamimune N, Gammagard, Gammagard S/D, Gammaked, Gammaplex, Gammar-P IV, Gamunex, Gamunex-C, Hizentra, Iveegam, Iveegam EN, Octagam, Panglobulin, Panglobulin NF, panzyga, Polygam S/D, Privigen , Sandoglobulin, Venoglobulin-S, Vigam, Vivaglobulin, Xembify What should I tell my care team before I take this medication? They need to know if you have any of these conditions: Blood clotting disorder Condition where you have excess fluid in your body, such as heart failure or edema Dehydration Diabetes Have had blood clots Heart disease Immune system conditions Kidney disease Low levels of IgA Recent or upcoming vaccine An unusual or allergic reaction to immune globulin , other medications, foods, dyes, or preservatives Pregnant or trying to get pregnant Breastfeeding How should I use this medication? This medication is infused into a vein or under the skin. It may also be injected into a muscle. It is usually given by your care team in a hospital or clinic setting. It may also be given at home. If you get this medication at home, you will be taught how to prepare and give it. Take it as directed on the prescription label. Keep taking it unless your care team tells you to stop. It is important that you put your used needles and syringes in a special sharps container. Do not put them in a trash can. If you do not have a sharps container, call your pharmacist or care team to get one. Talk to your care team  about the use of this medication in children. While it may be given to children for selected conditions, precautions do apply. Overdosage: If you think you have taken too much of this medicine contact a poison control center or emergency room at once. NOTE: This medicine is only for you. Do not share this medicine with others. What if I miss a dose? If you get this medication at the hospital or clinic: It is important not to miss your dose. Call your care team if you are unable to keep an appointment. If you give yourself this medication at home: If you miss a dose, take it as soon as you can. Then continue your normal schedule. If it is almost time for your next dose, take only that dose. Do not take double or extra doses. Call your care team with questions. What may interact with this medication? Live virus vaccines This list may not describe all possible interactions. Give your health care provider a list of all the medicines, herbs, non-prescription drugs, or dietary supplements you use. Also tell them if you smoke, drink alcohol , or use illegal drugs. Some items may interact with your medicine. What should I watch for while using this medication? Your condition will be monitored carefully while you are receiving this medication. Tell your care team if your symptoms do not start to get better or if they get worse. You may need blood work done while you are taking this medication. This medication increases the risk of blood clots. People with heart, blood vessel, or blood clotting conditions are more likely to develop a blood clot. Other risk factors include advanced age, estrogen use, tobacco  use, lack of movement, and being overweight. This medication can decrease the response to a vaccine. If you need to get vaccinated, tell your care team if you have received this medication within the last year. Extra booster doses may be needed. Talk to your care team to see if a different vaccination schedule  is needed. This medication is made from donated human blood. There is a small risk it may contain bacteria or viruses, such as hepatitis or HIV. All products are processed to kill most bacteria and viruses. Talk to your care team if you have questions about the risk of infection. If you have diabetes, talk to your care team about which device you should use to check your blood sugar. This medication may cause some devices to report falsely high blood sugar levels. This may cause you to react by not treating a low blood sugar level or by giving an insulin  dose that was not needed. This can cause severe low blood sugar levels. What side effects may I notice from receiving this medication? Side effects that you should report to your care team as soon as possible: Allergic reactions--skin rash, itching, hives, swelling of the face, lips, tongue, or throat Blood clot--pain, swelling, or warmth in the leg, shortness of breath, chest pain Fever, neck pain or stiffness, sensitivity to light, headache, nausea, vomiting, confusion, which may be signs of meningitis Hemolytic anemia--unusual weakness or fatigue, dizziness, headache, trouble breathing, dark urine, yellowing skin or eyes Kidney injury--decrease in the amount of urine, swelling of the ankles, hands, or feet Low sodium level--muscle weakness, fatigue, dizziness, headache, confusion Shortness of breath or trouble breathing, cough, unusual weakness or fatigue, blue skin or lips Side effects that usually do not require medical attention (report these to your care team if they continue or are bothersome): Chills Diarrhea Fever Headache Nausea This list may not describe all possible side effects. Call your doctor for medical advice about side effects. You may report side effects to FDA at 1-800-FDA-1088. Where should I keep my medication? Keep out of the reach of children and pets. You will be instructed on how to store this medication. Get rid of  any unused medication after the expiration date. To get rid of medications that are no longer needed or have expired: Take the medication to a medication take-back program. Check with your pharmacy or law enforcement to find a location. If you cannot return the medication, ask your pharmacist or care team how to get rid of this medication safely. NOTE: This sheet is a summary. It may not cover all possible information. If you have questions about this medicine, talk to your doctor, pharmacist, or health care provider.  2025 Elsevier/Gold Standard (2023-06-04 00:00:00)

## 2024-01-04 ENCOUNTER — Inpatient Hospital Stay

## 2024-01-04 VITALS — BP 106/65 | HR 98 | Temp 100.1°F | Resp 18

## 2024-01-04 DIAGNOSIS — Z5112 Encounter for antineoplastic immunotherapy: Secondary | ICD-10-CM | POA: Diagnosis not present

## 2024-01-04 DIAGNOSIS — C9002 Multiple myeloma in relapse: Secondary | ICD-10-CM

## 2024-01-04 LAB — CMP (CANCER CENTER ONLY)
ALT: 22 U/L (ref 0–44)
AST: 18 U/L (ref 15–41)
Albumin: 3.5 g/dL (ref 3.5–5.0)
Alkaline Phosphatase: 64 U/L (ref 38–126)
Anion gap: 9 (ref 5–15)
BUN: 14 mg/dL (ref 8–23)
CO2: 26 mmol/L (ref 22–32)
Calcium: 8.6 mg/dL — ABNORMAL LOW (ref 8.9–10.3)
Chloride: 99 mmol/L (ref 98–111)
Creatinine: 0.63 mg/dL (ref 0.61–1.24)
GFR, Estimated: >60 mL/min (ref >60)
Glucose, Bld: 131 mg/dL — ABNORMAL HIGH (ref 70–99)
Potassium: 4.2 mmol/L (ref 3.5–5.1)
Sodium: 134 mmol/L — ABNORMAL LOW (ref 135–145)
Total Bilirubin: 1 mg/dL (ref 0.0–1.2)
Total Protein: 6.3 g/dL — ABNORMAL LOW (ref 6.5–8.1)

## 2024-01-04 LAB — CBC WITH DIFFERENTIAL (CANCER CENTER ONLY)
Abs Immature Granulocytes: 0.07 K/uL (ref 0.00–0.07)
Basophils Absolute: 0 K/uL (ref 0.0–0.1)
Basophils Relative: 0 %
Eosinophils Absolute: 0.2 K/uL (ref 0.0–0.5)
Eosinophils Relative: 2 %
HCT: 43.9 % (ref 39.0–52.0)
Hemoglobin: 14.6 g/dL (ref 13.0–17.0)
Immature Granulocytes: 1 %
Lymphocytes Relative: 4 %
Lymphs Abs: 0.4 K/uL — ABNORMAL LOW (ref 0.7–4.0)
MCH: 29.5 pg (ref 26.0–34.0)
MCHC: 33.3 g/dL (ref 30.0–36.0)
MCV: 88.7 fL (ref 80.0–100.0)
Monocytes Absolute: 0.7 K/uL (ref 0.1–1.0)
Monocytes Relative: 7 %
Neutro Abs: 8.8 K/uL — ABNORMAL HIGH (ref 1.7–7.7)
Neutrophils Relative %: 86 %
Platelet Count: 198 K/uL (ref 150–400)
RBC: 4.95 MIL/uL (ref 4.22–5.81)
RDW: 16.3 % — ABNORMAL HIGH (ref 11.5–15.5)
WBC Count: 10.2 K/uL (ref 4.0–10.5)
nRBC: 0.2 % (ref 0.0–0.2)

## 2024-01-04 MED ORDER — BORTEZOMIB CHEMO SQ INJECTION 3.5 MG (2.5MG/ML)
1.3000 mg/m2 | Freq: Once | INTRAMUSCULAR | Status: AC
  Administered 2024-01-04: 3 mg via SUBCUTANEOUS
  Filled 2024-01-04: qty 1.2

## 2024-01-04 NOTE — Patient Instructions (Signed)

## 2024-01-04 NOTE — Progress Notes (Signed)
 Patient states that he took his pre meds at home today. He states that he took Tylenol , Benadryl , Dexamethasone  and Singulair . He does not take Compazine .

## 2024-01-07 ENCOUNTER — Ambulatory Visit: Admitting: Urology

## 2024-01-10 ENCOUNTER — Telehealth: Payer: Self-pay | Admitting: *Deleted

## 2024-01-10 NOTE — Progress Notes (Signed)
 Remote Loop Recorder Transmission

## 2024-01-10 NOTE — Telephone Encounter (Signed)
 Request for Procedure Clearance From [facility] in [place]. OK to HOLD 81mg  Aspirin for 5 days prior to Prostate Biopsy. Pt has a Tree surgeon for AFib. Form faxed to their office.

## 2024-01-11 ENCOUNTER — Inpatient Hospital Stay

## 2024-01-11 VITALS — BP 112/79 | HR 89 | Temp 98.1°F | Resp 16

## 2024-01-11 DIAGNOSIS — Z5112 Encounter for antineoplastic immunotherapy: Secondary | ICD-10-CM | POA: Diagnosis not present

## 2024-01-11 DIAGNOSIS — C9002 Multiple myeloma in relapse: Secondary | ICD-10-CM

## 2024-01-11 LAB — CMP (CANCER CENTER ONLY)
ALT: 22 U/L (ref 0–44)
AST: 21 U/L (ref 15–41)
Albumin: 3.3 g/dL — ABNORMAL LOW (ref 3.5–5.0)
Alkaline Phosphatase: 57 U/L (ref 38–126)
Anion gap: 10 (ref 5–15)
BUN: 12 mg/dL (ref 8–23)
CO2: 27 mmol/L (ref 22–32)
Calcium: 8.4 mg/dL — ABNORMAL LOW (ref 8.9–10.3)
Chloride: 97 mmol/L — ABNORMAL LOW (ref 98–111)
Creatinine: 0.77 mg/dL (ref 0.61–1.24)
GFR, Estimated: >60 mL/min (ref >60)
Glucose, Bld: 112 mg/dL — ABNORMAL HIGH (ref 70–99)
Potassium: 3.8 mmol/L (ref 3.5–5.1)
Sodium: 134 mmol/L — ABNORMAL LOW (ref 135–145)
Total Bilirubin: 1 mg/dL (ref 0.0–1.2)
Total Protein: 6.2 g/dL — ABNORMAL LOW (ref 6.5–8.1)

## 2024-01-11 LAB — CBC WITH DIFFERENTIAL (CANCER CENTER ONLY)
Abs Immature Granulocytes: 0.07 K/uL (ref 0.00–0.07)
Basophils Absolute: 0 K/uL (ref 0.0–0.1)
Basophils Relative: 0 %
Eosinophils Absolute: 0.2 K/uL (ref 0.0–0.5)
Eosinophils Relative: 2 %
HCT: 44 % (ref 39.0–52.0)
Hemoglobin: 14.6 g/dL (ref 13.0–17.0)
Immature Granulocytes: 1 %
Lymphocytes Relative: 3 %
Lymphs Abs: 0.3 K/uL — ABNORMAL LOW (ref 0.7–4.0)
MCH: 29.3 pg (ref 26.0–34.0)
MCHC: 33.2 g/dL (ref 30.0–36.0)
MCV: 88.4 fL (ref 80.0–100.0)
Monocytes Absolute: 0.6 K/uL (ref 0.1–1.0)
Monocytes Relative: 6 %
Neutro Abs: 9.6 K/uL — ABNORMAL HIGH (ref 1.7–7.7)
Neutrophils Relative %: 88 %
Platelet Count: 131 K/uL — ABNORMAL LOW (ref 150–400)
RBC: 4.98 MIL/uL (ref 4.22–5.81)
RDW: 16.3 % — ABNORMAL HIGH (ref 11.5–15.5)
WBC Count: 10.9 K/uL — ABNORMAL HIGH (ref 4.0–10.5)
nRBC: 0 % (ref 0.0–0.2)

## 2024-01-11 MED ORDER — DIPHENHYDRAMINE HCL 25 MG PO CAPS: 25.0000 mg | ORAL_CAPSULE | Freq: Once | ORAL | Status: DC

## 2024-01-11 MED ORDER — BORTEZOMIB CHEMO SQ INJECTION 3.5 MG (2.5MG/ML)
1.3000 mg/m2 | Freq: Once | INTRAMUSCULAR | Status: AC
  Administered 2024-01-11: 3 mg via SUBCUTANEOUS
  Filled 2024-01-11: qty 1.2

## 2024-01-11 MED ORDER — MONTELUKAST SODIUM 10 MG PO TABS: 10.0000 mg | ORAL_TABLET | ORAL | Status: DC

## 2024-01-11 MED ORDER — DEXAMETHASONE 4 MG PO TABS: 20.0000 mg | ORAL_TABLET | Freq: Once | ORAL | Status: DC

## 2024-01-11 MED ORDER — ACETAMINOPHEN 325 MG PO TABS: 650.0000 mg | ORAL_TABLET | Freq: Once | ORAL | Status: DC

## 2024-01-11 MED ORDER — PROCHLORPERAZINE MALEATE 10 MG PO TABS: 10.0000 mg | ORAL_TABLET | Freq: Once | ORAL | Status: DC

## 2024-01-11 NOTE — Patient Instructions (Signed)
 [facility] - A DEPT OF [facility]  Discharge Instructions: Thank you for choosing [facility] to provide your oncology and hematology care.  If you have a lab appointment with the Cancer Center, please go directly to the Cancer Center and check in at the registration area.  Wear comfortable clothing and clothing appropriate for easy access to any Portacath or PICC line.   We strive to give you quality time with your provider. You may need to reschedule your appointment if you arrive late (15 or more minutes).  Arriving late affects you and other patients whose appointments are after yours.  Also, if you miss three or more appointments without notifying the office, you may be dismissed from the clinic at the provider's discretion.      For prescription refill requests, have your pharmacy contact our office and allow 72 hours for refills to be completed.    Today you received the following chemotherapy and/or immunotherapy agents- velcade      To help prevent nausea and vomiting after your treatment, we encourage you to take your nausea medication as directed.  BELOW ARE SYMPTOMS THAT SHOULD BE REPORTED IMMEDIATELY: *FEVER GREATER THAN 100.4 F (38 C) OR HIGHER *CHILLS OR SWEATING *NAUSEA AND VOMITING THAT IS NOT CONTROLLED WITH YOUR NAUSEA MEDICATION *UNUSUAL SHORTNESS OF BREATH *UNUSUAL BRUISING OR BLEEDING *URINARY PROBLEMS (pain or burning when urinating, or frequent urination) *BOWEL PROBLEMS (unusual diarrhea, constipation, pain near the anus) TENDERNESS IN MOUTH AND THROAT WITH OR WITHOUT PRESENCE OF ULCERS (sore throat, sores in mouth, or a toothache) UNUSUAL RASH, SWELLING OR PAIN  UNUSUAL VAGINAL DISCHARGE OR ITCHING   Items with * indicate a potential emergency and should be followed up as soon as possible or go to the Emergency Department if any problems should occur.  Please show the CHEMOTHERAPY ALERT CARD or IMMUNOTHERAPY  ALERT CARD at check-in to the Emergency Department and triage nurse.  Should you have questions after your visit or need to cancel or reschedule your appointment, please contact [facility] - A DEPT OF [facility]  [phone] and follow the prompts.  Office hours are 8:00 a.m. to 4:30 p.m. Monday - Friday. Please note that voicemails left after 4:00 p.m. may not be returned until the following business day.  We are closed weekends and major holidays. You have access to a nurse at all times for urgent questions. Please call the main number to the clinic [phone] and follow the prompts.  For any non-urgent questions, you may also contact your provider using MyChart. We now offer e-Visits for anyone 47 and older to request care online for non-urgent symptoms. For details visit [email or web address].   Also download the MyChart app! Go to the app store, search "MyChart", open the app, select [place], and log in with your MyChart username and password.

## 2024-01-12 ENCOUNTER — Encounter

## 2024-01-13 ENCOUNTER — Encounter

## 2024-01-14 ENCOUNTER — Ambulatory Visit

## 2024-01-14 ENCOUNTER — Encounter

## 2024-01-14 DIAGNOSIS — I4819 Other persistent atrial fibrillation: Secondary | ICD-10-CM

## 2024-01-14 LAB — CUP PACEART REMOTE DEVICE CHECK
Date Time Interrogation Session: 20251012230502
Implantable Pulse Generator Implant Date: 20210518

## 2024-01-14 LAB — KAPPA/LAMBDA LIGHT CHAINS
Kappa free light chain: 5.2 mg/L (ref 3.3–19.4)
Kappa, lambda light chain ratio: 1.33 (ref 0.26–1.65)
Lambda free light chains: 3.9 mg/L — ABNORMAL LOW (ref 5.7–26.3)

## 2024-01-15 LAB — MULTIPLE MYELOMA PANEL, SERUM
Albumin SerPl Elph-Mcnc: 3 g/dL (ref 2.9–4.4)
Albumin/Glob SerPl: 1.2 (ref 0.7–1.7)
Alpha 1: 0.3 g/dL (ref 0.0–0.4)
Alpha2 Glob SerPl Elph-Mcnc: 0.9 g/dL (ref 0.4–1.0)
B-Globulin SerPl Elph-Mcnc: 0.9 g/dL (ref 0.7–1.3)
Gamma Glob SerPl Elph-Mcnc: 0.5 g/dL (ref 0.4–1.8)
Globulin, Total: 2.6 g/dL (ref 2.2–3.9)
IgA: 23 mg/dL — ABNORMAL LOW (ref 61–437)
IgG (Immunoglobin G), Serum: 661 mg/dL (ref 603–1613)
IgM (Immunoglobulin M), Srm: 14 mg/dL — ABNORMAL LOW (ref 20–172)
Total Protein ELP: 5.6 g/dL — ABNORMAL LOW (ref 6.0–8.5)

## 2024-01-15 NOTE — Progress Notes (Signed)
 Remote Loop Recorder Transmission

## 2024-01-16 ENCOUNTER — Ambulatory Visit: Payer: Self-pay | Admitting: Cardiology

## 2024-01-18 ENCOUNTER — Inpatient Hospital Stay

## 2024-01-18 ENCOUNTER — Encounter: Payer: Self-pay | Admitting: Internal Medicine

## 2024-01-18 ENCOUNTER — Encounter: Payer: Self-pay | Admitting: Nurse Practitioner

## 2024-01-18 ENCOUNTER — Inpatient Hospital Stay: Admitting: Nurse Practitioner

## 2024-01-18 VITALS — BP 103/73 | HR 81 | Temp 98.6°F | Resp 17 | Wt 255.5 lb

## 2024-01-18 DIAGNOSIS — Z5111 Encounter for antineoplastic chemotherapy: Secondary | ICD-10-CM | POA: Diagnosis not present

## 2024-01-18 DIAGNOSIS — Z23 Encounter for immunization: Secondary | ICD-10-CM

## 2024-01-18 DIAGNOSIS — D6959 Other secondary thrombocytopenia: Secondary | ICD-10-CM | POA: Diagnosis not present

## 2024-01-18 DIAGNOSIS — C9002 Multiple myeloma in relapse: Secondary | ICD-10-CM

## 2024-01-18 DIAGNOSIS — Z5112 Encounter for antineoplastic immunotherapy: Secondary | ICD-10-CM | POA: Diagnosis not present

## 2024-01-18 DIAGNOSIS — E559 Vitamin D deficiency, unspecified: Secondary | ICD-10-CM

## 2024-01-18 LAB — CMP (CANCER CENTER ONLY)
ALT: 35 U/L (ref 0–44)
AST: 22 U/L (ref 15–41)
Albumin: 3.1 g/dL — ABNORMAL LOW (ref 3.5–5.0)
Alkaline Phosphatase: 52 U/L (ref 38–126)
Anion gap: 9 (ref 5–15)
BUN: 12 mg/dL (ref 8–23)
CO2: 25 mmol/L (ref 22–32)
Calcium: 8.6 mg/dL — ABNORMAL LOW (ref 8.9–10.3)
Chloride: 103 mmol/L (ref 98–111)
Creatinine: 0.82 mg/dL (ref 0.61–1.24)
GFR, Estimated: >60 mL/min (ref >60)
Glucose, Bld: 114 mg/dL — ABNORMAL HIGH (ref 70–99)
Potassium: 4 mmol/L (ref 3.5–5.1)
Sodium: 137 mmol/L (ref 135–145)
Total Bilirubin: 0.9 mg/dL (ref 0.0–1.2)
Total Protein: 5.8 g/dL — ABNORMAL LOW (ref 6.5–8.1)

## 2024-01-18 LAB — CBC WITH DIFFERENTIAL (CANCER CENTER ONLY)
Abs Immature Granulocytes: 0.03 K/uL (ref 0.00–0.07)
Basophils Absolute: 0 K/uL (ref 0.0–0.1)
Basophils Relative: 0 %
Eosinophils Absolute: 0.2 K/uL (ref 0.0–0.5)
Eosinophils Relative: 4 %
HCT: 40.6 % (ref 39.0–52.0)
Hemoglobin: 13.9 g/dL (ref 13.0–17.0)
Immature Granulocytes: 0 %
Lymphocytes Relative: 7 %
Lymphs Abs: 0.5 K/uL — ABNORMAL LOW (ref 0.7–4.0)
MCH: 29.7 pg (ref 26.0–34.0)
MCHC: 34.2 g/dL (ref 30.0–36.0)
MCV: 86.8 fL (ref 80.0–100.0)
Monocytes Absolute: 0.7 K/uL (ref 0.1–1.0)
Monocytes Relative: 10 %
Neutro Abs: 5.4 K/uL (ref 1.7–7.7)
Neutrophils Relative %: 79 %
Platelet Count: 83 K/uL — ABNORMAL LOW (ref 150–400)
RBC: 4.68 MIL/uL (ref 4.22–5.81)
RDW: 16 % — ABNORMAL HIGH (ref 11.5–15.5)
WBC Count: 6.9 K/uL (ref 4.0–10.5)
nRBC: 0 % (ref 0.0–0.2)

## 2024-01-18 LAB — VITAMIN D 25 HYDROXY (VIT D DEFICIENCY, FRACTURES): Vit D, 25-Hydroxy: 60.46 ng/mL (ref 30–100)

## 2024-01-18 MED ORDER — INFLUENZA VAC SPLIT HIGH-DOSE 0.5 ML IM SUSY
0.5000 mL | PREFILLED_SYRINGE | INTRAMUSCULAR | Status: AC
  Administered 2024-01-18: 0.5 mL via INTRAMUSCULAR
  Filled 2024-01-18: qty 0.5

## 2024-01-18 NOTE — Progress Notes (Signed)
 [facility] CONSULT NOTE  Patient Care Team: [name], MD as PCP - General (Internal Medicine) Cindie Ole DASEN, MD as PCP - Electrophysiology (Cardiology) Rennie Cindy SAUNDERS, MD as Consulting Physician (Oncology)  CHIEF COMPLAINTS/PURPOSE OF CONSULTATION: Multiple Myeloma   Oncology History Overview Note  # LIGHT CHAIN MONOCLONAL GAMMOPATHY -DEC 2024-[PCP-SPEP-NEGATIVE; Hb 13; GFR-> 60 ca-WN; However, random UPEP- positive for M protein/Bence-Jones- Protein positive; kappa type.  JAN 2025- SPEP- NEG; K/l= 91 [K=400]. Dx= includes smoldering MM vs active MM. 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)   06/05/2023 Cancer Staging   Staging form: Plasma Cell Myeloma and Plasma Cell Disorders, AJCC 8th Edition - Clinical: Albumin (g/dL): 4, ISS: Stage II, High-risk cytogenetics: Absent, LDH: Normal - Signed by Rennie Cindy SAUNDERS, MD on 06/05/2023 Albumin range (g/dL): Greater than or equal to 3.5 Cytogenetics: t(11;14) translocation   06/06/2023 Cancer Staging   Staging form: Plasma Cell Myeloma and Plasma Cell Disorders, AJCC 8th Edition - Clinical: RISS Stage I (Beta-2 -microglobulin (mg/L): 2.5, Albumin (g/dL): 4, ISS: Stage I, High-risk cytogenetics: Absent, LDH: Normal) - Signed by  Rennie Cindy SAUNDERS, MD on 06/06/2023 Stage prefix: Initial diagnosis Beta 2 microglobulin range (mg/L): Less than 3.5 Albumin range (g/dL): Greater than or equal to 3.5 Cytogenetics: t(11;14) translocation   07/27/2023 -  Chemotherapy   Patient is on Treatment Plan : MYELOMA NEWLY DIAGNOSED TRANSPLANT CANDIDATE DaraVRd (Daratumumab  SQ) with weekly bortezomib  (D1,8,15) q21d x 6 Cycles (Induction/Consolidation)      HISTORY OF PRESENTING ILLNESS: Patient ambulating-independently. Accompanied by wife.   Jimmy [name] 68 y.o.  male with A.fib [watchman device; not on anti-coagulation] standard risk- Multiple Myeloma [dx: march 2025] with multiple bone lesions, currently status post induction DARA-RVD-is here to proceed with consolidation Dara-RVD is here for a follow-up. Currently s/p admission at [facility] for afib with RVR, rate controlled. Underwent BMBx and pheresis for stem cell collection. Feeling well. Questions having prostate screening mri. Last revlimid  was last Tuesday. Facial rash is controlled/mild.   Review of Systems  Constitutional:  Negative for chills, diaphoresis, fever and weight loss.  HENT:  Negative for nosebleeds and sore throat.   Eyes:  Negative for double vision.  Respiratory:  Negative for cough, hemoptysis, sputum production, shortness of breath and wheezing.   Cardiovascular:  Negative for chest pain, palpitations, orthopnea and leg swelling.  Gastrointestinal:  Negative for abdominal pain, blood in stool, constipation, diarrhea, heartburn, melena, nausea and vomiting.  Genitourinary:  Negative for dysuria, frequency and urgency.  Musculoskeletal:  Negative for back pain and joint pain.  Skin: Negative.  Negative for itching and rash.  Neurological:  Negative for dizziness, tingling, focal weakness, weakness and headaches.  Endo/Heme/Allergies:  Does not bruise/bleed easily.  Psychiatric/Behavioral:  Negative for depression. The patient is not nervous/anxious and  does not have insomnia.     MEDICAL  HISTORY:  Past Medical History:  Diagnosis Date   Atrial fibrillation (HCC)    had loop recorder, PAcs after DCCV 09/2019   Atrial flutter (HCC)    Fatigue    HLD (hyperlipidemia)    On amiodarone therapy 09/15/2019   Pain of right lower leg 01/02/2022    SURGICAL HISTORY: Past Surgical History:  Procedure Laterality Date   afib surgical ablation  2021   CARDIOVERSION     2021 for Afib in [place]   CHOLECYSTECTOMY     GALLBLADDER SURGERY  2006   IR BONE MARROW BIOPSY & ASPIRATION  05/18/2023   MINIMALLY INVASIVE MAZE PROCEDURE     done in [place]  Dr JONELLE. Gala   stye Right 09/2022    SOCIAL HISTORY: Social History   Socioeconomic History   Marital status: Married    Spouse name: Not on file   Number of children: Not on file   Years of education: Not on file   Highest education level: Bachelor's degree (e.g., BA, AB, BS)  Occupational History   Not on file  Tobacco Use   Smoking status: Never   Smokeless tobacco: Never  Vaping Use   Vaping status: Never Used  Substance and Sexual Activity   Alcohol use: Never   Drug use: Not Currently   Sexual activity: Not Currently  Other Topics Concern   Not on file  Social History Narrative   2 sons and 1 daughter    Married    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    No guns, wears seat belt, safe in relationship   Vegan x 8-9 years as of 07/06/20    Social Drivers of Corporate investment banker Strain: Low Risk  (12/28/2023)   Overall Financial Resource Strain (CARDIA)    Difficulty of Paying Living Expenses: Not hard at all  Food Insecurity: No Food Insecurity (12/28/2023)   Hunger Vital Sign    Worried About Running Out of Food in the Last Year: Never true    Ran Out of Food in the Last Year: Never true  Transportation Needs: No Transportation Needs (12/28/2023)   PRAPARE - Administrator, Civil Service (Medical): No    Lack of Transportation (Non-Medical): No   Physical Activity: Inactive (12/28/2023)   Exercise Vital Sign    Days of Exercise per Week: 0 days    Minutes of Exercise per Session: Not on file  Stress: No Stress Concern Present (12/28/2023)   [name]    Feeling of Stress: Not at all  Social Connections: Socially Isolated (12/28/2023)   Social Connection and Isolation Panel    Frequency of Communication with Friends and Family: Once a week    Frequency of Social Gatherings with Friends and Family: Never    Attends Religious Services: Never    Database administrator or Organizations: No    Attends Engineer, structural: Not on file    Marital Status: Married  Catering manager Violence: Not At Risk (04/16/2023)   Humiliation, Afraid, Rape, and Kick questionnaire    Fear of Current or Ex-Partner: No    Emotionally Abused: No    Physically Abused: No    Sexually Abused: No    FAMILY HISTORY: Family History  Problem Relation Age of Onset   COPD Mother    Atrial fibrillation Mother        ? due to Afib died  72   Cancer Father        lung cancer smoker age 57    Heart disease Father        bypass at 61    Atrial fibrillation Sister     ALLERGIES:  has no known allergies.  MEDICATIONS:  Current Outpatient Medications  Medication Sig Dispense Refill   acetaminophen  (TYLENOL ) 325 MG tablet Take 650 mg by mouth once a week. Take 1 hour prior to infusion appointment.     acyclovir  (ZOVIRAX ) 400 MG tablet Take 1 tablet (400 mg total) by mouth 2 (two) times daily. 180 tablet 1   ASPIRIN 81 PO Take 81 mg by mouth daily.     cyanocobalamin  1000 MCG tablet Take 2,000 mcg by mouth.     dexamethasone  (DECADRON ) 4 MG tablet Take 5 tabs (20mg ) by mouth 1 hour prior to weekly infusion appointment and take 5 tabs (20mg ) the day after weekly infusion appointment. Take with with food . 30 tablet 0   diphenhydrAMINE  (BENADRYL ) 50 MG capsule Take 50 mg by mouth once a  week. Take 1 hour prior to infusion appointment.     ergocalciferol  (VITAMIN D2) 1.25 MG (50000 UT) capsule Take 1 capsule (50,000 Units total) by mouth once a week. 12 capsule 1   hydrOXYzine  (ATARAX ) 10 MG tablet Take 1 tablet (10 mg total) by mouth 3 (three) times daily as needed. 30 tablet 0   lenalidomide  (REVLIMID ) 20 MG capsule Take 1 capsule (20 mg total) by mouth daily. Take for 14 days, then hold for 7 days. Repeat every 21 days. [[facility]] 14 capsule 0   metoprolol tartrate (LOPRESSOR) 25 MG tablet Take 1 tablet (25 mg total) by mouth 2 (two) times daily. 180 tablet 2   montelukast  (SINGULAIR ) 10 MG tablet Take daily; and on days of infusion- Take 1 hour prior to infusion appointment. 90 tablet 1   Multiple Vitamin (CALCIUM COMPLEX PO) Take by mouth daily.     Omega-3 1400 MG CAPS      ondansetron  (ZOFRAN ) 8 MG tablet One pill every 8 hours as needed for nausea/vomitting. 40 tablet 1   prochlorperazine  (COMPAZINE ) 10 MG tablet Take 1 tablet (10 mg total) by mouth every 6 (six) hours as needed for nausea or vomiting. 40 tablet 1   Turmeric (CURCUMIN 95) 500 MG CAPS Take 500 mg by mouth 2 (two) times daily.     Vitamin D -Vitamin K (VITAMIN K2-VITAMIN D3 PO) Take by mouth daily.     No current facility-administered medications for this visit.   PHYSICAL EXAMINATION: There were no vitals filed for this visit. Filed Weights   01/18/24 1103  Weight: 255 lb 8 oz (115.9 kg)   Physical Exam Constitutional:      Appearance: He is not ill-appearing.  Eyes:     General: No scleral icterus.    Conjunctiva/sclera: Conjunctivae normal.  Cardiovascular:     Rate and Rhythm: Normal rate and regular rhythm.  Chest:     Comments: Positive for bilateral gynecomastia.  Abdominal:     General: There is no distension.     Palpations: Abdomen is soft.     Tenderness: There is no abdominal tenderness. There is no guarding.  Musculoskeletal:        General: No deformity.     Right lower leg: No  edema.     Left lower leg: No edema.  Lymphadenopathy:     Cervical: No cervical adenopathy.  Skin:    General: Skin is  warm and dry.  Neurological:     Mental Status: He is alert and oriented to person, place, and time. Mental status is at baseline.  Psychiatric:        Mood and Affect: Mood normal.        Behavior: Behavior normal.    LABORATORY DATA:  I have reviewed the data as listed Lab Results  Component Value Date   WBC 6.9 01/18/2024   HGB 13.9 01/18/2024   HCT 40.6 01/18/2024   MCV 86.8 01/18/2024   PLT 83 (L) 01/18/2024   Recent Labs    01/04/24 1400 01/11/24 1339 01/18/24 1043  NA 134* 134* 137  K 4.2 3.8 4.0  CL 99 97* 103  CO2 26 27 25   GLUCOSE 131* 112* 114*  BUN 14 12 12   CREATININE 0.63 0.77 0.82  CALCIUM 8.6* 8.4* 8.6*  GFRNONAA >60 >60 >60  PROT 6.3* 6.2* 5.8*  ALBUMIN 3.5 3.3* 3.1*  AST 18 21 22   ALT 22 22 35  ALKPHOS 64 57 52  BILITOT 1.0 1.0 0.9   CUP PACEART REMOTE DEVICE CHECK Result Date: 01/14/2024 ILR summary report received. Battery status OK. Normal device function. No new symptom, tachy, brady, or pause episodes.  Persistent AF, good V-rate control, no OAC, left atrial appendage clipped.   Monthly summary reports and ROV/PRN ML, CVRS  Lab Results  Component Value Date   KPAFRELGTCHN 5.2 01/11/2024   KPAFRELGTCHN 4.8 12/28/2023   KPAFRELGTCHN 5.9 11/16/2023   LAMBDASER 3.9 (L) 01/11/2024   LAMBDASER 3.2 (L) 12/28/2023   LAMBDASER 4.1 (L) 11/16/2023   KAPLAMBRATIO 1.33 01/11/2024   KAPLAMBRATIO 1.50 12/28/2023   KAPLAMBRATIO 1.44 11/16/2023    Multiple myeloma in relapse (HCC) # DEC-JAN 2025- STANDARD RISK CYTOGENETICS LIGHT CHAIN MONOCLONAL GAMMOPATHY -DEC 2024-MARCH 2025-PET Numerous lytic myelomatous bone lesions throughout the axial and appendicular skeleton noted on the CT scan without associated discrete significant hypermetabolism.   S/P evaluation with Dr.Choi [facility]- bone marrow evaluation]. TRANSPLANT ELIGIBLE. 4/25-  Currently on Dara-RVD [q 21 d cycle- Griffin trial-status post 5 cycles-bone marrow biopsy [[facility]]-November 28, 2023-MRD -LOW POSITIVE [2 cell/million].  Status post stem cell collection patient declines stem cell transplant.  Proceed with consolidation chemotherapy total of 9 cycles. [facility] SEP 2025- PET scan-negative for any acute myelomatous lesions in the bone.  # Here today for consideration of C7D1 of Dara-Rev-Dex (q21d cycle- Griffin Trial- revlimid  20 mg dose reduced d/t rash)).    labs today reviewed.  Thrombocytopenia.  Platelets are 83,000.  Likely secondary to Revlimid .  Hold treatment today and will defer to about 1 week.    # Secondary hypogammaglobinemia-IVIG infusions every 3- 4 weeks. Will postpone x 1 week.    #  rash on the face- improved  with rev- 20 mg. Monitor closely.   # 2025- PET Scan incidental-  Some ill-defined activity above blood pool is seen at the left inferior  aspect of the prostate. This is nonspecific but suspect prostatitis.  Recommend correlation with PSA values. If significantly elevated, then prostate MRI can be obtained for further assessment.  Currently awaiting urology evaluation.   # Multiple bone lesion- [per pt s/p dental clearance] -Continue ca+Vit D BID. [Vit D 1000/d; 5000 q OD]. vit D 28 [June 2025]-recommend 50,000 units vitamin D  once a week; and recommend calcium 1800 mg once a day- Stable.  Will consider Zometa next week   # History of a flutter [s/p ablation in [place] - clip of Left atrial appendage]-no anticoagulation- on asprin;  irregular rhythm-2D echo pending.  Stable.   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   # DVT-ID Prophylaxis: asprin/acyclovir -  Stable.   # ACP: had Advance directives.    # # IV access:PIV    # 1-4 cycles-SQ Dara-velcade  weekly x4 cycles; 5-6 dara-q2 w; Lavern; Zometa-??    MD appt- q 3W   Pre-meds-?PS   Q4w- MM panel, K/L Light chains IVIG q4w x 3 times  # DISPOSITION: No  tx today Defer all to next week - will need to see MD Move IVIG out x 1 week as well- la   No problem-specific Assessment & Plan notes found for this encounter.  All questions were answered. The patient knows to call the clinic with any problems, questions or concerns.    Tinnie KANDICE Dawn, NP 01/18/2024

## 2024-01-22 ENCOUNTER — Encounter: Payer: Self-pay | Admitting: Internal Medicine

## 2024-01-24 ENCOUNTER — Other Ambulatory Visit: Payer: Self-pay

## 2024-01-24 ENCOUNTER — Inpatient Hospital Stay

## 2024-01-24 DIAGNOSIS — C9002 Multiple myeloma in relapse: Secondary | ICD-10-CM

## 2024-01-25 ENCOUNTER — Other Ambulatory Visit

## 2024-01-25 ENCOUNTER — Ambulatory Visit

## 2024-01-25 ENCOUNTER — Inpatient Hospital Stay (HOSPITAL_BASED_OUTPATIENT_CLINIC_OR_DEPARTMENT_OTHER): Admitting: Oncology

## 2024-01-25 ENCOUNTER — Inpatient Hospital Stay

## 2024-01-25 ENCOUNTER — Encounter: Payer: Self-pay | Admitting: Oncology

## 2024-01-25 ENCOUNTER — Other Ambulatory Visit: Payer: Self-pay | Admitting: *Deleted

## 2024-01-25 VITALS — BP 113/75 | HR 77 | Temp 97.1°F | Resp 16 | Wt 254.9 lb

## 2024-01-25 DIAGNOSIS — C9002 Multiple myeloma in relapse: Secondary | ICD-10-CM

## 2024-01-25 DIAGNOSIS — C9000 Multiple myeloma not having achieved remission: Secondary | ICD-10-CM | POA: Diagnosis not present

## 2024-01-25 DIAGNOSIS — Z5112 Encounter for antineoplastic immunotherapy: Secondary | ICD-10-CM | POA: Diagnosis not present

## 2024-01-25 LAB — CMP (CANCER CENTER ONLY)
ALT: 21 U/L (ref 0–44)
AST: 17 U/L (ref 15–41)
Albumin: 3.2 g/dL — ABNORMAL LOW (ref 3.5–5.0)
Alkaline Phosphatase: 53 U/L (ref 38–126)
Anion gap: 9 (ref 5–15)
BUN: 14 mg/dL (ref 8–23)
CO2: 23 mmol/L (ref 22–32)
Calcium: 8.4 mg/dL — ABNORMAL LOW (ref 8.9–10.3)
Chloride: 102 mmol/L (ref 98–111)
Creatinine: 0.74 mg/dL (ref 0.61–1.24)
GFR, Estimated: >60 mL/min (ref >60)
Glucose, Bld: 111 mg/dL — ABNORMAL HIGH (ref 70–99)
Potassium: 3.8 mmol/L (ref 3.5–5.1)
Sodium: 134 mmol/L — ABNORMAL LOW (ref 135–145)
Total Bilirubin: 0.8 mg/dL (ref 0.0–1.2)
Total Protein: 6 g/dL — ABNORMAL LOW (ref 6.5–8.1)

## 2024-01-25 LAB — CBC WITH DIFFERENTIAL (CANCER CENTER ONLY)
Abs Immature Granulocytes: 0.03 K/uL (ref 0.00–0.07)
Basophils Absolute: 0.1 K/uL (ref 0.0–0.1)
Basophils Relative: 1 %
Eosinophils Absolute: 0.3 K/uL (ref 0.0–0.5)
Eosinophils Relative: 5 %
HCT: 40.7 % (ref 39.0–52.0)
Hemoglobin: 13.6 g/dL (ref 13.0–17.0)
Immature Granulocytes: 1 %
Lymphocytes Relative: 10 %
Lymphs Abs: 0.6 K/uL — ABNORMAL LOW (ref 0.7–4.0)
MCH: 29.4 pg (ref 26.0–34.0)
MCHC: 33.4 g/dL (ref 30.0–36.0)
MCV: 88.1 fL (ref 80.0–100.0)
Monocytes Absolute: 0.9 K/uL (ref 0.1–1.0)
Monocytes Relative: 13 %
Neutro Abs: 4.6 K/uL (ref 1.7–7.7)
Neutrophils Relative %: 70 %
Platelet Count: 289 K/uL (ref 150–400)
RBC: 4.62 MIL/uL (ref 4.22–5.81)
RDW: 15.9 % — ABNORMAL HIGH (ref 11.5–15.5)
WBC Count: 6.4 K/uL (ref 4.0–10.5)
nRBC: 0 % (ref 0.0–0.2)

## 2024-01-25 MED ORDER — DIPHENHYDRAMINE HCL 25 MG PO CAPS: 25.0000 mg | ORAL_CAPSULE | Freq: Once | ORAL | Status: DC

## 2024-01-25 MED ORDER — DEXAMETHASONE 4 MG PO TABS: 20.0000 mg | ORAL_TABLET | Freq: Once | ORAL | Status: DC

## 2024-01-25 MED ORDER — DARATUMUMAB-HYALURONIDASE-FIHJ 1800-30000 MG-UT/15ML ~~LOC~~ SOLN
1800.0000 mg | Freq: Once | SUBCUTANEOUS | Status: AC
  Administered 2024-01-25: 1800 mg via SUBCUTANEOUS
  Filled 2024-01-25: qty 15

## 2024-01-25 MED ORDER — MONTELUKAST SODIUM 10 MG PO TABS: 10.0000 mg | ORAL_TABLET | Freq: Every day | ORAL | Status: DC

## 2024-01-25 MED ORDER — LENALIDOMIDE 20 MG PO CAPS: 20.0000 mg | ORAL_CAPSULE | Freq: Every day | ORAL | 0 refills | Status: DC

## 2024-01-25 MED ORDER — ACETAMINOPHEN 325 MG PO TABS: 650.0000 mg | ORAL_TABLET | Freq: Once | ORAL | Status: DC

## 2024-01-25 MED ORDER — BORTEZOMIB CHEMO SQ INJECTION 3.5 MG (2.5MG/ML)
1.3000 mg/m2 | Freq: Once | INTRAMUSCULAR | Status: AC
  Administered 2024-01-25: 3 mg via SUBCUTANEOUS
  Filled 2024-01-25: qty 1.2

## 2024-01-25 NOTE — Progress Notes (Signed)
 [facility] CONSULT NOTE  Patient Care Team: [name], MD as PCP - General (Internal Medicine) Cindie Ole DASEN, MD as PCP - Electrophysiology (Cardiology) Rennie Cindy SAUNDERS, MD as Consulting Physician (Oncology)  CHIEF COMPLAINTS/PURPOSE OF CONSULTATION: Multiple Myeloma   Oncology History Overview Note  # LIGHT CHAIN MONOCLONAL GAMMOPATHY -DEC 2024-[PCP-SPEP-NEGATIVE; Hb 13; GFR-> 60 ca-WN; However, random UPEP- positive for M protein/Bence-Jones- Protein positive; kappa type.  JAN 2025- SPEP- NEG; K/l= 91 [K=400]. Dx= includes smoldering MM vs active MM. 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)   06/05/2023 Cancer Staging   Staging form: Plasma Cell Myeloma and Plasma Cell Disorders, AJCC 8th Edition - Clinical: Albumin (g/dL): 4, ISS: Stage II, High-risk cytogenetics: Absent, LDH: Normal - Signed by Rennie Cindy SAUNDERS, MD on 06/05/2023 Albumin range (g/dL): Greater than or equal to 3.5 Cytogenetics: t(11;14) translocation   06/06/2023 Cancer Staging   Staging form: Plasma Cell Myeloma and Plasma Cell Disorders, AJCC 8th Edition - Clinical: RISS Stage I (Beta-2 -microglobulin (mg/L): 2.5, Albumin (g/dL): 4, ISS: Stage I, High-risk cytogenetics: Absent, LDH: Normal) - Signed by  Rennie Cindy SAUNDERS, MD on 06/06/2023 Stage prefix: Initial diagnosis Beta 2 microglobulin range (mg/L): Less than 3.5 Albumin range (g/dL): Greater than or equal to 3.5 Cytogenetics: t(11;14) translocation   07/27/2023 -  Chemotherapy   Patient is on Treatment Plan : MYELOMA NEWLY DIAGNOSED TRANSPLANT CANDIDATE DaraVRd (Daratumumab  SQ) with weekly bortezomib  (D1,8,15) q21d x 6 Cycles (Induction/Consolidation)      HISTORY OF PRESENTING ILLNESS: Patient ambulating-independently. Accompanied by wife.   TIRON SUSKI 68 y.o.  male with A.fib [watchman device; not on anti-coagulation] standard risk- Multiple Myeloma [dx: march 2025] with multiple bone lesions-currently status post induction DARA-RVD-is here to proceed with consolidation Dara-RVD is here for a follow-up.    Patient follows up with Dr.Brahmanday who is off today, I am covering him to see this patient.   In general appetite is good, doing well. Denies neuropathy symptoms.  Denies any pain. He has not received his Revlimid .   Review of Systems  Constitutional:  Negative for chills, diaphoresis, fever and weight loss.  HENT:  Negative for nosebleeds and sore throat.   Eyes:  Negative for double vision.  Respiratory:  Negative for cough, hemoptysis, sputum production, shortness of breath and wheezing.   Cardiovascular:  Negative for chest pain, palpitations, orthopnea and leg swelling.  Gastrointestinal:  Negative for abdominal pain, blood in stool, constipation, diarrhea, heartburn, melena, nausea and vomiting.  Genitourinary:  Negative for dysuria, frequency and urgency.  Musculoskeletal:  Negative for back pain and joint pain.  Skin: Negative.  Negative for itching and rash.  Neurological:  Negative for dizziness, tingling, focal weakness, weakness and headaches.  Endo/Heme/Allergies:  Does not bruise/bleed easily.  Psychiatric/Behavioral:  Negative for depression. The patient is not nervous/anxious and does not have  insomnia.     MEDICAL HISTORY:  Past Medical History:  Diagnosis Date   Atrial fibrillation (HCC)    had loop recorder, PAcs after DCCV 09/2019   Atrial flutter (HCC)    Fatigue    HLD (hyperlipidemia)    On amiodarone therapy 09/15/2019   Pain of right lower leg 01/02/2022    SURGICAL HISTORY: Past Surgical History:  Procedure Laterality Date   afib surgical ablation  2021   CARDIOVERSION     2021 for Afib in [place]   CHOLECYSTECTOMY     GALLBLADDER SURGERY  2006   IR BONE MARROW BIOPSY & ASPIRATION  05/18/2023   MINIMALLY INVASIVE MAZE PROCEDURE     done in [place]  Dr R. Gala   stye Right 09/2022    SOCIAL HISTORY: Social History   Socioeconomic History   Marital status: Married    Spouse name: Not on file   Number of children: Not on file   Years of education: Not on file   Highest education level: Bachelor's degree (e.g., BA, AB, BS)  Occupational History   Not on file  Tobacco Use   Smoking status: Never   Smokeless tobacco: Never  Vaping Use   Vaping status: Never Used  Substance and Sexual Activity   Alcohol use: Never   Drug use: Not Currently   Sexual activity: Not Currently  Other Topics Concern   Not on file  Social History Narrative   2 sons and 1 daughter    Married    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    No guns, wears seat belt, safe in relationship   Vegan x 8-9 years as of 07/06/20    Social Drivers of Corporate investment banker Strain: Low Risk  (12/28/2023)   Overall Financial Resource Strain (CARDIA)    Difficulty of Paying Living Expenses: Not hard at all  Food Insecurity: No Food Insecurity (12/28/2023)   Hunger Vital Sign    Worried About Running Out of Food in the Last Year: Never true    Ran Out of Food in the Last Year: Never true  Transportation Needs: No Transportation Needs (12/28/2023)   PRAPARE - Administrator, Civil Service (Medical): No    Lack of Transportation (Non-Medical): No  Physical Activity:  Inactive (12/28/2023)   Exercise Vital Sign    Days of Exercise per Week: 0 days    Minutes of Exercise per Session: Not on file  Stress: No Stress Concern Present (12/28/2023)   [name]    Feeling of Stress: Not at all  Social Connections: Socially Isolated (12/28/2023)   Social Connection and Isolation Panel    Frequency of Communication with Friends and Family: Once a week    Frequency of Social Gatherings with Friends and Family: Never    Attends Religious Services: Never    Database administrator or Organizations: No    Attends Engineer, structural: Not on file    Marital Status: Married  Catering manager Violence: Not At Risk (04/16/2023)   Humiliation, Afraid, Rape, and Kick questionnaire    Fear of Current or Ex-Partner: No    Emotionally Abused: No    Physically Abused: No    Sexually Abused: No    FAMILY HISTORY: Family History  Problem Relation Age of Onset   COPD Mother    Atrial fibrillation Mother        ?  due to Afib died 29   Cancer Father        lung cancer smoker age 72    Heart disease Father        bypass at 28    Atrial fibrillation Sister     ALLERGIES:  has no known allergies.  MEDICATIONS:  Current Outpatient Medications  Medication Sig Dispense Refill   acetaminophen  (TYLENOL ) 325 MG tablet Take 650 mg by mouth once a week. Take 1 hour prior to infusion appointment.     acyclovir  (ZOVIRAX ) 400 MG tablet Take 1 tablet (400 mg total) by mouth 2 (two) times daily. 180 tablet 1   ASPIRIN 81 PO Take 81 mg by mouth daily.     cyanocobalamin  1000 MCG tablet Take 2,000 mcg by mouth.     dexamethasone  (DECADRON ) 4 MG tablet Take 5 tabs (20mg ) by mouth 1 hour prior to weekly infusion appointment and take 5 tabs (20mg ) the day after weekly infusion appointment. Take with with food . 30 tablet 0   diphenhydrAMINE  (BENADRYL ) 50 MG capsule Take 50 mg by mouth once a week. Take 1 hour  prior to infusion appointment.     ergocalciferol  (VITAMIN D2) 1.25 MG (50000 UT) capsule Take 1 capsule (50,000 Units total) by mouth once a week. 12 capsule 1   hydrOXYzine  (ATARAX ) 10 MG tablet Take 1 tablet (10 mg total) by mouth 3 (three) times daily as needed. 30 tablet 0   metoprolol tartrate (LOPRESSOR) 25 MG tablet Take 1 tablet (25 mg total) by mouth 2 (two) times daily. 180 tablet 2   montelukast  (SINGULAIR ) 10 MG tablet Take daily; and on days of infusion- Take 1 hour prior to infusion appointment. 90 tablet 1   Multiple Vitamin (CALCIUM COMPLEX PO) Take by mouth daily.     Omega-3 1400 MG CAPS      ondansetron  (ZOFRAN ) 8 MG tablet One pill every 8 hours as needed for nausea/vomitting. 40 tablet 1   prochlorperazine  (COMPAZINE ) 10 MG tablet Take 1 tablet (10 mg total) by mouth every 6 (six) hours as needed for nausea or vomiting. 40 tablet 1   Turmeric (CURCUMIN 95) 500 MG CAPS Take 500 mg by mouth 2 (two) times daily.     Vitamin D -Vitamin K (VITAMIN K2-VITAMIN D3 PO) Take by mouth daily.     lenalidomide  (REVLIMID ) 20 MG capsule Take 1 capsule (20 mg total) by mouth daily. Take for 14 days, then hold for 7 days. Repeat every 21 days. [[facility]] 14 capsule 0   No current facility-administered medications for this visit.   Facility-Administered Medications Ordered in Other Visits  Medication Dose Route Frequency Provider Last Rate Last Admin   acetaminophen  (TYLENOL ) tablet 650 mg  650 mg Oral Once Brahmanday, Govinda R, MD       dexamethasone  (DECADRON ) tablet 20 mg  20 mg Oral Once Brahmanday, Govinda R, MD       diphenhydrAMINE  (BENADRYL ) capsule 25 mg  25 mg Oral Once Brahmanday, Govinda R, MD       montelukast  (SINGULAIR ) tablet 10 mg  10 mg Oral QHS Brahmanday, Govinda R, MD       PHYSICAL EXAMINATION:   Vitals:   01/25/24 0838  BP: 113/75  Pulse: 77  Resp: 16  Temp: (!) 97.1 F (36.2 C)  SpO2: 99%     Filed Weights   01/25/24 0838  Weight: 254 lb 14.4 oz (115.6  kg)     Positive for bilateral gynecomastia.    Physical Exam  Vitals and nursing note reviewed.  HENT:     Head: Normocephalic and atraumatic.     Mouth/Throat:     Pharynx: Oropharynx is clear.  Eyes:     Extraocular Movements: Extraocular movements intact.     Pupils: Pupils are equal, round, and reactive to light.  Cardiovascular:     Rate and Rhythm: Normal rate and regular rhythm.  Abdominal:     Palpations: Abdomen is soft.  Musculoskeletal:        General: Normal range of motion.     Cervical back: Normal range of motion.  Skin:    General: Skin is warm.  Neurological:     General: No focal deficit present.     Mental Status: He is alert and oriented to person, place, and time.  Psychiatric:        Behavior: Behavior normal.        Judgment: Judgment normal.        LABORATORY DATA:  I have reviewed the data as listed Lab Results  Component Value Date   WBC 6.4 01/25/2024   HGB 13.6 01/25/2024   HCT 40.7 01/25/2024   MCV 88.1 01/25/2024   PLT 289 01/25/2024   Recent Labs    01/11/24 1339 01/18/24 1043 01/25/24 0827  NA 134* 137 134*  K 3.8 4.0 3.8  CL 97* 103 102  CO2 27 25 23   GLUCOSE 112* 114* 111*  BUN 12 12 14   CREATININE 0.77 0.82 0.74  CALCIUM 8.4* 8.6* 8.4*  GFRNONAA >60 >60 >60  PROT 6.2* 5.8* 6.0*  ALBUMIN 3.3* 3.1* 3.2*  AST 21 22 17   ALT 22 35 21  ALKPHOS 57 52 53  BILITOT 1.0 0.9 0.8   CUP PACEART REMOTE DEVICE CHECK Result Date: 01/14/2024 ILR summary report received. Battery status OK. Normal device function. No new symptom, tachy, brady, or pause episodes.  Persistent AF, good V-rate control, no OAC, left atrial appendage clipped.   Monthly summary reports and ROV/PRN ML, CVRS    Lab Results  Component Value Date   KPAFRELGTCHN 5.2 01/11/2024   KPAFRELGTCHN 4.8 12/28/2023   KPAFRELGTCHN 5.9 11/16/2023   LAMBDASER 3.9 (L) 01/11/2024   LAMBDASER 3.2 (L) 12/28/2023   LAMBDASER 4.1 (L) 11/16/2023   KAPLAMBRATIO 1.33  01/11/2024   KAPLAMBRATIO 1.50 12/28/2023   KAPLAMBRATIO 1.44 11/16/2023     Multiple myeloma in relapse (HCC) # LIGHT CHAIN MONOCLONAL GAMMOPATHY -DEC 2024-[PCP-SPEP-NEGATIVE; Hb 13; GFR-> 60 ca-WN; However, random UPEP- positive for M protein/Bence-Jones- Protein positive; kappa type.  JAN 2025- SPEP- NEG; K/l= 91 [K=400]. Dx= includes smoldering MM vs active MM. FEB 2025- Bone marrow- BONE MARROW, ASPIRATE, CLOT, CORE:  -  Kappa restricted plasma cell neoplasm involving 50 to 90% of the  cellular marrow (patchy areas; average 70%)   cytogenetics: 11:14; 13 q- [standard risk] Negative Congo stain. MARCH 2025-PET Numerous lytic myelomatous bone lesions throughout the axial and appendicular skeleton noted on the CT scan without associated discrete significant hypermetabolism.  There are 2 slightly hypermetabolic foci in noted in the right femur which appear to correlate with CT abnormalities. Low level hypermetabolism with SUV max of 2.26 and 2.20. A few other scattered bone lesions have similar FDG uptake. No hypermetabolic soft tissue masses or adenopathy. S/P evaluation with Dr.Choi [facility]- bone marrow evaluation]. Discussed with Dr.Choi. TRANSPLANT ELIGIBLE. 4/25- Currently on Dara-RVD [q 21 d cycle- Griffin trial-    # PROCEED WITH Cycle 7 day- 1- Dara/Velcade  today.  He took the premeds at  home. Will start revlimid  D1-14 when he gets Revlimid  supply. D8, D15 treatments per IS  # Multiple bone lesion-- [per pt s/p dental clearance] -Continue ca+Vit D BID. [Vit D 1000/d; 5000 q OD]. Monitor for now- hold off Zometa given the allergic reaction to above meds, Calcium is low, hold Zometa   # DVT-ID Prophylaxis: asprin/acyclovir   All questions were answered. The patient knows to call the clinic with any problems, questions or concerns.    Zelphia Cap, MD 01/25/2024 1:36 PM

## 2024-01-25 NOTE — Assessment & Plan Note (Signed)
#   LIGHT CHAIN MONOCLONAL GAMMOPATHY -DEC 2024-[PCP-SPEP-NEGATIVE; Hb 13; GFR-> 60 ca-WN; However, random UPEP- positive for M protein/Bence-Jones- Protein positive; kappa type.  JAN 2025- SPEP- NEG; K/l= 91 [K=400]. Dx= includes smoldering MM vs active MM. FEB 2025- Bone marrow- BONE MARROW, ASPIRATE, CLOT, CORE:  -  Kappa restricted plasma cell neoplasm involving 50 to 90% of the  cellular marrow (patchy areas; average 70%)   cytogenetics: 11:14; 13 q- [standard risk] Negative Congo stain. MARCH 2025-PET Numerous lytic myelomatous bone lesions throughout the axial and appendicular skeleton noted on the CT scan without associated discrete significant hypermetabolism.  There are 2 slightly hypermetabolic foci in noted in the right femur which appear to correlate with CT abnormalities. Low level hypermetabolism with SUV max of 2.26 and 2.20. A few other scattered bone lesions have similar FDG uptake. No hypermetabolic soft tissue masses or adenopathy. S/P evaluation with Dr.Choi [facility]- bone marrow evaluation]. Discussed with Dr.Choi. TRANSPLANT ELIGIBLE. 4/25- Currently on Dara-RVD [q 21 d cycle- Griffin trial-    # PROCEED WITH Cycle 7 day- 1- Dara/Velcade  today.  He took the premeds at home. Will start revlimid  D1-14 when he gets Revlimid  supply. D8, D15 treatments per IS  # Multiple bone lesion-- [per pt s/p dental clearance] -Continue ca+Vit D BID. [Vit D 1000/d; 5000 q OD]. Monitor for now- hold off Zometa given the allergic reaction to above meds, Calcium is low, hold Zometa   # DVT-ID Prophylaxis: asprin/acyclovir

## 2024-01-31 ENCOUNTER — Inpatient Hospital Stay

## 2024-01-31 VITALS — BP 115/78 | HR 72 | Temp 98.0°F | Resp 18 | Wt 255.5 lb

## 2024-01-31 DIAGNOSIS — Z5112 Encounter for antineoplastic immunotherapy: Secondary | ICD-10-CM | POA: Diagnosis not present

## 2024-01-31 DIAGNOSIS — C9002 Multiple myeloma in relapse: Secondary | ICD-10-CM

## 2024-01-31 MED ORDER — ACETAMINOPHEN 325 MG PO TABS
650.0000 mg | ORAL_TABLET | Freq: Once | ORAL | Status: AC
  Administered 2024-01-31: 650 mg via ORAL
  Filled 2024-01-31: qty 2

## 2024-01-31 MED ORDER — DEXTROSE 5 % IV SOLN
INTRAVENOUS | Status: DC
  Filled 2024-01-31: qty 250

## 2024-01-31 MED ORDER — DIPHENHYDRAMINE HCL 25 MG PO CAPS
25.0000 mg | ORAL_CAPSULE | Freq: Once | ORAL | Status: AC
  Administered 2024-01-31: 25 mg via ORAL
  Filled 2024-01-31: qty 1

## 2024-01-31 MED ORDER — IMMUNE GLOBULIN (HUMAN) 10 GM/100ML IV SOLN
400.0000 mg/kg | Freq: Once | INTRAVENOUS | Status: AC
  Administered 2024-01-31: 35 g via INTRAVENOUS
  Filled 2024-01-31: qty 350

## 2024-01-31 NOTE — Patient Instructions (Signed)
 Immune Globulin  Injection What is this medication? IMMUNE GLOBULIN  (im MUNE GLOB yoo lin) treats many immune system conditions. It works by Designer, multimedia extra antibodies. Antibodies are proteins made by the immune system that help protect the body. This medicine may be used for other purposes; ask your health care provider or pharmacist if you have questions. COMMON BRAND NAME(S): ASCENIV, Baygam, BIVIGAM, Carimune, Carimune NF, cutaquig, Cuvitru, Flebogamma, Flebogamma DIF, GamaSTAN, GamaSTAN S/D, Gamimune N, Gammagard, Gammagard S/D, Gammaked, Gammaplex, Gammar-P IV, Gamunex, Gamunex-C, Hizentra, Iveegam, Iveegam EN, Octagam, Panglobulin, Panglobulin NF, panzyga, Polygam S/D, Privigen , Sandoglobulin, Venoglobulin-S, Vigam, Vivaglobulin, Xembify What should I tell my care team before I take this medication? They need to know if you have any of these conditions: Blood clotting disorder Condition where you have excess fluid in your body, such as heart failure or edema Dehydration Diabetes Have had blood clots Heart disease Immune system conditions Kidney disease Low levels of IgA Recent or upcoming vaccine An unusual or allergic reaction to immune globulin , other medications, foods, dyes, or preservatives Pregnant or trying to get pregnant Breastfeeding How should I use this medication? This medication is infused into a vein or under the skin. It may also be injected into a muscle. It is usually given by your care team in a hospital or clinic setting. It may also be given at home. If you get this medication at home, you will be taught how to prepare and give it. Take it as directed on the prescription label. Keep taking it unless your care team tells you to stop. It is important that you put your used needles and syringes in a special sharps container. Do not put them in a trash can. If you do not have a sharps container, call your pharmacist or care team to get one. Talk to your care team  about the use of this medication in children. While it may be given to children for selected conditions, precautions do apply. Overdosage: If you think you have taken too much of this medicine contact a poison control center or emergency room at once. NOTE: This medicine is only for you. Do not share this medicine with others. What if I miss a dose? If you get this medication at the hospital or clinic: It is important not to miss your dose. Call your care team if you are unable to keep an appointment. If you give yourself this medication at home: If you miss a dose, take it as soon as you can. Then continue your normal schedule. If it is almost time for your next dose, take only that dose. Do not take double or extra doses. Call your care team with questions. What may interact with this medication? Live virus vaccines This list may not describe all possible interactions. Give your health care provider a list of all the medicines, herbs, non-prescription drugs, or dietary supplements you use. Also tell them if you smoke, drink alcohol , or use illegal drugs. Some items may interact with your medicine. What should I watch for while using this medication? Your condition will be monitored carefully while you are receiving this medication. Tell your care team if your symptoms do not start to get better or if they get worse. You may need blood work done while you are taking this medication. This medication increases the risk of blood clots. People with heart, blood vessel, or blood clotting conditions are more likely to develop a blood clot. Other risk factors include advanced age, estrogen use, tobacco  use, lack of movement, and being overweight. This medication can decrease the response to a vaccine. If you need to get vaccinated, tell your care team if you have received this medication within the last year. Extra booster doses may be needed. Talk to your care team to see if a different vaccination schedule  is needed. This medication is made from donated human blood. There is a small risk it may contain bacteria or viruses, such as hepatitis or HIV. All products are processed to kill most bacteria and viruses. Talk to your care team if you have questions about the risk of infection. If you have diabetes, talk to your care team about which device you should use to check your blood sugar. This medication may cause some devices to report falsely high blood sugar levels. This may cause you to react by not treating a low blood sugar level or by giving an insulin  dose that was not needed. This can cause severe low blood sugar levels. What side effects may I notice from receiving this medication? Side effects that you should report to your care team as soon as possible: Allergic reactions--skin rash, itching, hives, swelling of the face, lips, tongue, or throat Blood clot--pain, swelling, or warmth in the leg, shortness of breath, chest pain Fever, neck pain or stiffness, sensitivity to light, headache, nausea, vomiting, confusion, which may be signs of meningitis Hemolytic anemia--unusual weakness or fatigue, dizziness, headache, trouble breathing, dark urine, yellowing skin or eyes Kidney injury--decrease in the amount of urine, swelling of the ankles, hands, or feet Low sodium level--muscle weakness, fatigue, dizziness, headache, confusion Shortness of breath or trouble breathing, cough, unusual weakness or fatigue, blue skin or lips Side effects that usually do not require medical attention (report these to your care team if they continue or are bothersome): Chills Diarrhea Fever Headache Nausea This list may not describe all possible side effects. Call your doctor for medical advice about side effects. You may report side effects to FDA at 1-800-FDA-1088. Where should I keep my medication? Keep out of the reach of children and pets. You will be instructed on how to store this medication. Get rid of  any unused medication after the expiration date. To get rid of medications that are no longer needed or have expired: Take the medication to a medication take-back program. Check with your pharmacy or law enforcement to find a location. If you cannot return the medication, ask your pharmacist or care team how to get rid of this medication safely. NOTE: This sheet is a summary. It may not cover all possible information. If you have questions about this medicine, talk to your doctor, pharmacist, or health care provider.  2025 Elsevier/Gold Standard (2023-06-04 00:00:00)

## 2024-02-01 ENCOUNTER — Inpatient Hospital Stay

## 2024-02-01 VITALS — BP 130/75 | HR 74 | Temp 98.5°F | Resp 16

## 2024-02-01 DIAGNOSIS — Z5112 Encounter for antineoplastic immunotherapy: Secondary | ICD-10-CM | POA: Diagnosis not present

## 2024-02-01 DIAGNOSIS — C9002 Multiple myeloma in relapse: Secondary | ICD-10-CM

## 2024-02-01 LAB — CMP (CANCER CENTER ONLY)
ALT: 22 U/L (ref 0–44)
AST: 19 U/L (ref 15–41)
Albumin: 3.3 g/dL — ABNORMAL LOW (ref 3.5–5.0)
Alkaline Phosphatase: 50 U/L (ref 38–126)
Anion gap: 9 (ref 5–15)
BUN: 12 mg/dL (ref 8–23)
CO2: 27 mmol/L (ref 22–32)
Calcium: 8.4 mg/dL — ABNORMAL LOW (ref 8.9–10.3)
Chloride: 103 mmol/L (ref 98–111)
Creatinine: 0.93 mg/dL (ref 0.61–1.24)
GFR, Estimated: >60 mL/min (ref >60)
Glucose, Bld: 113 mg/dL — ABNORMAL HIGH (ref 70–99)
Potassium: 4.7 mmol/L (ref 3.5–5.1)
Sodium: 139 mmol/L (ref 135–145)
Total Bilirubin: 0.8 mg/dL (ref 0.0–1.2)
Total Protein: 6.7 g/dL (ref 6.5–8.1)

## 2024-02-01 LAB — CBC WITH DIFFERENTIAL (CANCER CENTER ONLY)
Abs Immature Granulocytes: 0.09 K/uL — ABNORMAL HIGH (ref 0.00–0.07)
Basophils Absolute: 0.1 K/uL (ref 0.0–0.1)
Basophils Relative: 1 %
Eosinophils Absolute: 0.2 K/uL (ref 0.0–0.5)
Eosinophils Relative: 2 %
HCT: 43.2 % (ref 39.0–52.0)
Hemoglobin: 14.3 g/dL (ref 13.0–17.0)
Immature Granulocytes: 1 %
Lymphocytes Relative: 6 %
Lymphs Abs: 0.5 K/uL — ABNORMAL LOW (ref 0.7–4.0)
MCH: 29.2 pg (ref 26.0–34.0)
MCHC: 33.1 g/dL (ref 30.0–36.0)
MCV: 88.2 fL (ref 80.0–100.0)
Monocytes Absolute: 0.7 K/uL (ref 0.1–1.0)
Monocytes Relative: 8 %
Neutro Abs: 7.3 K/uL (ref 1.7–7.7)
Neutrophils Relative %: 82 %
Platelet Count: 175 K/uL (ref 150–400)
RBC: 4.9 MIL/uL (ref 4.22–5.81)
RDW: 15.7 % — ABNORMAL HIGH (ref 11.5–15.5)
WBC Count: 8.9 K/uL (ref 4.0–10.5)
nRBC: 0 % (ref 0.0–0.2)

## 2024-02-01 MED ORDER — ACETAMINOPHEN 325 MG PO TABS: 650.0000 mg | ORAL_TABLET | Freq: Once | ORAL | Status: DC

## 2024-02-01 MED ORDER — DEXAMETHASONE 4 MG PO TABS: 20.0000 mg | ORAL_TABLET | Freq: Once | ORAL | Status: DC

## 2024-02-01 MED ORDER — BORTEZOMIB CHEMO SQ INJECTION 3.5 MG (2.5MG/ML)
1.3000 mg/m2 | Freq: Once | INTRAMUSCULAR | Status: AC
  Administered 2024-02-01: 3 mg via SUBCUTANEOUS
  Filled 2024-02-01: qty 1.2

## 2024-02-01 MED ORDER — MONTELUKAST SODIUM 10 MG PO TABS: 10.0000 mg | ORAL_TABLET | Freq: Once | ORAL | Status: DC

## 2024-02-01 MED ORDER — DIPHENHYDRAMINE HCL 25 MG PO CAPS: 25.0000 mg | ORAL_CAPSULE | Freq: Once | ORAL | Status: DC

## 2024-02-01 MED ORDER — PROCHLORPERAZINE MALEATE 10 MG PO TABS: 10.0000 mg | ORAL_TABLET | Freq: Once | ORAL | Status: DC

## 2024-02-01 NOTE — Patient Instructions (Signed)
 [facility] - A DEPT OF [facility]  Discharge Instructions: Thank you for choosing [facility] to provide your oncology and hematology care.  If you have a lab appointment with the Cancer Center, please go directly to the Cancer Center and check in at the registration area.  Wear comfortable clothing and clothing appropriate for easy access to any Portacath or PICC line.   We strive to give you quality time with your provider. You may need to reschedule your appointment if you arrive late (15 or more minutes).  Arriving late affects you and other patients whose appointments are after yours.  Also, if you miss three or more appointments without notifying the office, you may be dismissed from the clinic at the provider's discretion.      For prescription refill requests, have your pharmacy contact our office and allow 72 hours for refills to be completed.    Today you received the following chemotherapy and/or immunotherapy agents- velcade      To help prevent nausea and vomiting after your treatment, we encourage you to take your nausea medication as directed.  BELOW ARE SYMPTOMS THAT SHOULD BE REPORTED IMMEDIATELY: *FEVER GREATER THAN 100.4 F (38 C) OR HIGHER *CHILLS OR SWEATING *NAUSEA AND VOMITING THAT IS NOT CONTROLLED WITH YOUR NAUSEA MEDICATION *UNUSUAL SHORTNESS OF BREATH *UNUSUAL BRUISING OR BLEEDING *URINARY PROBLEMS (pain or burning when urinating, or frequent urination) *BOWEL PROBLEMS (unusual diarrhea, constipation, pain near the anus) TENDERNESS IN MOUTH AND THROAT WITH OR WITHOUT PRESENCE OF ULCERS (sore throat, sores in mouth, or a toothache) UNUSUAL RASH, SWELLING OR PAIN  UNUSUAL VAGINAL DISCHARGE OR ITCHING   Items with * indicate a potential emergency and should be followed up as soon as possible or go to the Emergency Department if any problems should occur.  Please show the CHEMOTHERAPY ALERT CARD or IMMUNOTHERAPY  ALERT CARD at check-in to the Emergency Department and triage nurse.  Should you have questions after your visit or need to cancel or reschedule your appointment, please contact [facility] - A DEPT OF [facility]  [phone] and follow the prompts.  Office hours are 8:00 a.m. to 4:30 p.m. Monday - Friday. Please note that voicemails left after 4:00 p.m. may not be returned until the following business day.  We are closed weekends and major holidays. You have access to a nurse at all times for urgent questions. Please call the main number to the clinic [phone] and follow the prompts.  For any non-urgent questions, you may also contact your provider using MyChart. We now offer e-Visits for anyone 47 and older to request care online for non-urgent symptoms. For details visit [email or web address].   Also download the MyChart app! Go to the app store, search "MyChart", open the app, select [place], and log in with your MyChart username and password.

## 2024-02-04 NOTE — Progress Notes (Unsigned)
 Electrophysiology Office Note:   Date:  02/05/2024  ID:  Jimmy [name], DOB [birth year], MRN [id number]  Primary Cardiologist: None Primary Heart Failure: None Electrophysiologist: OLE ONEIDA HOLTS, MD      History of Present Illness:   Jimmy [name] is a 68 y.o. male with h/o AF s/p surgical ablation 2020/03/07 (MAZE), LAA clipping, multiple myeloma seen today for routine electrophysiology followup.   Recent admit at [facility] from 9/10-9/11/25 for [facility]. He initially presented to IR for outpatient placement of central access when  he developed symptomatic AFwRVR.  He was treated with metoprolol and fluid resuscitation with improvement in rate control. He was restarted on oral metoprolol tartrate 25 mg Q12 hours.   Since last being seen in our clinic the patient reports doing fairly well. He states he is still receiving chemotherapy for his multiple myeloma.  He is also in the midst of work up for prostate cancer. He states he sometimes has difficulty remembering to take his evening dose of metoprolol.     He denies chest pain, palpitations, dyspnea, PND, orthopnea, nausea, vomiting, dizziness, syncope, edema, weight gain, or early satiety.   Review of systems complete and found to be negative unless listed in HPI.   EP Information / Studies Reviewed:    EKG is ordered today. Personal review as below.  EKG Interpretation Date/Time:  Tuesday February 05 2024 13:55:48 EST Ventricular Rate:  93 PR Interval:    QRS Duration:  90 QT Interval:  382 QTC Calculation: 474 R Axis:   44  Text Interpretation: Atrial fibrillation Confirmed by Aniceto Jarvis ([id number]) on 02/05/2024 2:02:01 PM    Arrhythmia / AAD / Pertinent EP Studies AF  Surgical AF Ablation Mar 07, 2020 > antral isolation, ligament of Marshall dissection and ganglionic blocks, LAA clipped at [facility].  DCCV at [facility] 2020-03-07  Amiodarone 05/2023 > 08/2023 (not maintaining NSR)  Device:  MDT ILR implanted 08/19/19 for AF  Risk  Assessment/Calculations:    CHA2DS2-VASc Score = 1   This indicates a 0.6% annual risk of stroke. The patient's score is based upon: CHF History: 0 HTN History: 0 Diabetes History: 0 Stroke History: 0 Vascular Disease History: 0 Age Score: 1 Gender Score: 0              Physical Exam:   VS:  BP 120/70   Pulse 93   Ht 5' 11 (1.803 m)   Wt 261 lb (118.4 kg)   SpO2 94%   BMI 36.40 kg/m    Wt Readings from Last 3 Encounters:  02/05/24 261 lb (118.4 kg)  01/31/24 255 lb 8.2 oz (115.9 kg)  01/25/24 254 lb 14.4 oz (115.6 kg)     GEN: Well nourished, well developed in no acute distress NECK: No JVD; No carotid bruits CARDIAC: Irregularly irregular rate and rhythm, no murmurs, rubs, gallops RESPIRATORY:  Clear to auscultation without rales, wheezing or rhonchi  ABDOMEN: Soft, non-tender, non-distended EXTREMITIES:  No edema; No deformity   ASSESSMENT AND PLAN:    Persistent Atrial Fibrillation  Recent RVR Episode  CHA2DS2-VASc 1, hx MAZE, & LAA Clipping  -not on OAC with LAA clipping hx > continue ASA 81 mg daily  -change to Toprol 50 mg once daily  -MDT ILR review shows 99.9% of time in AF  -trial of amiodarone early in the year and he was unable to maintain NSR > stopped and focused on rate control while he is undergoing chemotherapy / steroids etc   Multiple Myeloma  -follows  at [facility]   Rule Out Prostate CA  -in process of work up for prostate cancer    Follow up with Dr. Almetta 4 months  > follow up to meet Dr. Almetta, reassess where he is in cancer treatment and if reasonable to pursue further attempts at restoring NSR   Signed, [name], NP-C, AGACNP-BC [facility] - Electrophysiology  02/05/2024, 2:02 PM

## 2024-02-05 ENCOUNTER — Encounter: Payer: Self-pay | Admitting: Pulmonary Disease

## 2024-02-05 ENCOUNTER — Ambulatory Visit: Attending: Pulmonary Disease | Admitting: Pulmonary Disease

## 2024-02-05 VITALS — BP 120/70 | HR 93 | Ht 71.0 in | Wt 261.0 lb

## 2024-02-05 DIAGNOSIS — I4819 Other persistent atrial fibrillation: Secondary | ICD-10-CM | POA: Diagnosis present

## 2024-02-05 MED ORDER — METOPROLOL SUCCINATE ER 50 MG PO TB24: 50.0000 mg | ORAL_TABLET | Freq: Every day | ORAL | 3 refills | Status: AC

## 2024-02-05 MED ORDER — METOPROLOL TARTRATE 25 MG PO TABS: 25.0000 mg | ORAL_TABLET | ORAL | Status: AC | PRN

## 2024-02-05 NOTE — Patient Instructions (Addendum)
 Medication Instructions:  We will change your Metoprolol to a long acting version > your new med will be TOPROL 50 mg daily   Keep the Metoprolol tartrate as an as needed medication if you were to have sustained HR >115 bpm.  Ok to take every 4 hours as needed if HR remains elevated   *If you need a refill on your cardiac medications before your next appointment, please call your pharmacy*  Lab Work: No lab work today If you have labs (blood work) drawn today and your tests are completely normal, you will receive your results only by: [employer] (if you have MyChart) OR A paper copy in the mail If you have any lab test that is abnormal or we need to change your treatment, we will call you to review the results.  Testing/Procedures: No testing/procedures were scheduled today  Follow-Up: At [facility], you and your health needs are our priority.  As part of our continuing mission to provide you with exceptional heart care, our providers are all part of one team.  This team includes your primary Cardiologist (physician) and Advanced Practice Providers or APPs (Physician Assistants and Nurse Practitioners) who all work together to provide you with the care you need, when you need it.  Your next appointment:   4 month(s)  Provider:   Dr. Almetta (from Dr. Cindie)

## 2024-02-06 ENCOUNTER — Encounter: Payer: Self-pay | Admitting: Internal Medicine

## 2024-02-07 ENCOUNTER — Encounter: Payer: Self-pay | Admitting: Internal Medicine

## 2024-02-08 ENCOUNTER — Ambulatory Visit

## 2024-02-08 ENCOUNTER — Ambulatory Visit: Admitting: Internal Medicine

## 2024-02-08 ENCOUNTER — Inpatient Hospital Stay

## 2024-02-08 ENCOUNTER — Inpatient Hospital Stay: Attending: Internal Medicine

## 2024-02-08 ENCOUNTER — Other Ambulatory Visit

## 2024-02-08 VITALS — BP 123/79 | HR 86 | Temp 98.5°F | Resp 18 | Ht 71.0 in | Wt 258.6 lb

## 2024-02-08 DIAGNOSIS — I4892 Unspecified atrial flutter: Secondary | ICD-10-CM | POA: Diagnosis not present

## 2024-02-08 DIAGNOSIS — C61 Malignant neoplasm of prostate: Secondary | ICD-10-CM | POA: Diagnosis not present

## 2024-02-08 DIAGNOSIS — C9002 Multiple myeloma in relapse: Secondary | ICD-10-CM

## 2024-02-08 DIAGNOSIS — Z7982 Long term (current) use of aspirin: Secondary | ICD-10-CM | POA: Diagnosis not present

## 2024-02-08 DIAGNOSIS — D801 Nonfamilial hypogammaglobulinemia: Secondary | ICD-10-CM | POA: Diagnosis not present

## 2024-02-08 DIAGNOSIS — Z5112 Encounter for antineoplastic immunotherapy: Secondary | ICD-10-CM | POA: Diagnosis present

## 2024-02-08 DIAGNOSIS — R21 Rash and other nonspecific skin eruption: Secondary | ICD-10-CM | POA: Insufficient documentation

## 2024-02-08 DIAGNOSIS — N62 Hypertrophy of breast: Secondary | ICD-10-CM | POA: Insufficient documentation

## 2024-02-08 LAB — CBC WITH DIFFERENTIAL (CANCER CENTER ONLY)
Abs Immature Granulocytes: 0.21 K/uL — ABNORMAL HIGH (ref 0.00–0.07)
Basophils Absolute: 0 K/uL (ref 0.0–0.1)
Basophils Relative: 0 %
Eosinophils Absolute: 0.3 K/uL (ref 0.0–0.5)
Eosinophils Relative: 3 %
HCT: 42.1 % (ref 39.0–52.0)
Hemoglobin: 14.2 g/dL (ref 13.0–17.0)
Immature Granulocytes: 2 %
Lymphocytes Relative: 5 %
Lymphs Abs: 0.5 K/uL — ABNORMAL LOW (ref 0.7–4.0)
MCH: 29.6 pg (ref 26.0–34.0)
MCHC: 33.7 g/dL (ref 30.0–36.0)
MCV: 87.7 fL (ref 80.0–100.0)
Monocytes Absolute: 0.6 K/uL (ref 0.1–1.0)
Monocytes Relative: 6 %
Neutro Abs: 8 K/uL — ABNORMAL HIGH (ref 1.7–7.7)
Neutrophils Relative %: 84 %
Platelet Count: 143 K/uL — ABNORMAL LOW (ref 150–400)
RBC: 4.8 MIL/uL (ref 4.22–5.81)
RDW: 15.8 % — ABNORMAL HIGH (ref 11.5–15.5)
WBC Count: 9.5 K/uL (ref 4.0–10.5)
nRBC: 0 % (ref 0.0–0.2)

## 2024-02-08 LAB — CMP (CANCER CENTER ONLY)
ALT: 23 U/L (ref 0–44)
AST: 21 U/L (ref 15–41)
Albumin: 3.2 g/dL — ABNORMAL LOW (ref 3.5–5.0)
Alkaline Phosphatase: 49 U/L (ref 38–126)
Anion gap: 7 (ref 5–15)
BUN: 12 mg/dL (ref 8–23)
CO2: 28 mmol/L (ref 22–32)
Calcium: 8.1 mg/dL — ABNORMAL LOW (ref 8.9–10.3)
Chloride: 100 mmol/L (ref 98–111)
Creatinine: 0.85 mg/dL (ref 0.61–1.24)
GFR, Estimated: >60 mL/min (ref >60)
Glucose, Bld: 106 mg/dL — ABNORMAL HIGH (ref 70–99)
Potassium: 3.9 mmol/L (ref 3.5–5.1)
Sodium: 135 mmol/L (ref 135–145)
Total Bilirubin: 0.8 mg/dL (ref 0.0–1.2)
Total Protein: 6.5 g/dL (ref 6.5–8.1)

## 2024-02-08 MED ORDER — PROCHLORPERAZINE MALEATE 10 MG PO TABS: 10.0000 mg | ORAL_TABLET | Freq: Once | ORAL | Status: DC

## 2024-02-08 MED ORDER — DEXAMETHASONE 4 MG PO TABS: 20.0000 mg | ORAL_TABLET | Freq: Once | ORAL | Status: DC

## 2024-02-08 MED ORDER — BORTEZOMIB CHEMO SQ INJECTION 3.5 MG (2.5MG/ML)
1.3000 mg/m2 | Freq: Once | INTRAMUSCULAR | Status: AC
  Administered 2024-02-08: 3 mg via SUBCUTANEOUS
  Filled 2024-02-08: qty 1.2

## 2024-02-08 MED ORDER — ACETAMINOPHEN 325 MG PO TABS: 650.0000 mg | ORAL_TABLET | Freq: Once | ORAL | Status: DC

## 2024-02-08 MED ORDER — DIPHENHYDRAMINE HCL 25 MG PO CAPS: 25.0000 mg | ORAL_CAPSULE | Freq: Once | ORAL | Status: DC

## 2024-02-08 MED ORDER — MONTELUKAST SODIUM 10 MG PO TABS: 10.0000 mg | ORAL_TABLET | Freq: Every day | ORAL | Status: DC

## 2024-02-11 LAB — KAPPA/LAMBDA LIGHT CHAINS
Kappa free light chain: 5.1 mg/L (ref 3.3–19.4)
Kappa, lambda light chain ratio: 0.91 (ref 0.26–1.65)
Lambda free light chains: 5.6 mg/L — ABNORMAL LOW (ref 5.7–26.3)

## 2024-02-12 ENCOUNTER — Encounter

## 2024-02-13 ENCOUNTER — Encounter

## 2024-02-14 ENCOUNTER — Inpatient Hospital Stay

## 2024-02-14 ENCOUNTER — Encounter

## 2024-02-14 ENCOUNTER — Ambulatory Visit (INDEPENDENT_AMBULATORY_CARE_PROVIDER_SITE_OTHER)

## 2024-02-14 DIAGNOSIS — I4819 Other persistent atrial fibrillation: Secondary | ICD-10-CM | POA: Diagnosis not present

## 2024-02-14 LAB — MULTIPLE MYELOMA PANEL, SERUM
Albumin SerPl Elph-Mcnc: 2.9 g/dL (ref 2.9–4.4)
Albumin/Glob SerPl: 1 (ref 0.7–1.7)
Alpha 1: 0.3 g/dL (ref 0.0–0.4)
Alpha2 Glob SerPl Elph-Mcnc: 1 g/dL (ref 0.4–1.0)
B-Globulin SerPl Elph-Mcnc: 0.9 g/dL (ref 0.7–1.3)
Gamma Glob SerPl Elph-Mcnc: 0.8 g/dL (ref 0.4–1.8)
Globulin, Total: 3 g/dL (ref 2.2–3.9)
IgA: 28 mg/dL — ABNORMAL LOW (ref 61–437)
IgG (Immunoglobin G), Serum: 845 mg/dL (ref 603–1613)
IgM (Immunoglobulin M), Srm: 21 mg/dL (ref 20–172)
Total Protein ELP: 5.9 g/dL — ABNORMAL LOW (ref 6.0–8.5)

## 2024-02-14 LAB — CUP PACEART REMOTE DEVICE CHECK
Date Time Interrogation Session: 20251112230446
Implantable Pulse Generator Implant Date: 20210518

## 2024-02-15 ENCOUNTER — Ambulatory Visit

## 2024-02-15 ENCOUNTER — Inpatient Hospital Stay

## 2024-02-15 ENCOUNTER — Other Ambulatory Visit

## 2024-02-15 ENCOUNTER — Encounter: Payer: Self-pay | Admitting: Internal Medicine

## 2024-02-15 ENCOUNTER — Inpatient Hospital Stay (HOSPITAL_BASED_OUTPATIENT_CLINIC_OR_DEPARTMENT_OTHER): Admitting: Internal Medicine

## 2024-02-15 VITALS — BP 97/69 | HR 75 | Temp 98.4°F | Resp 16 | Ht 71.0 in | Wt 253.0 lb

## 2024-02-15 VITALS — BP 109/78 | HR 67 | Resp 16

## 2024-02-15 DIAGNOSIS — Z5112 Encounter for antineoplastic immunotherapy: Secondary | ICD-10-CM | POA: Diagnosis not present

## 2024-02-15 DIAGNOSIS — C9002 Multiple myeloma in relapse: Secondary | ICD-10-CM

## 2024-02-15 DIAGNOSIS — C9000 Multiple myeloma not having achieved remission: Secondary | ICD-10-CM | POA: Diagnosis not present

## 2024-02-15 LAB — CMP (CANCER CENTER ONLY)
ALT: 30 U/L (ref 0–44)
AST: 18 U/L (ref 15–41)
Albumin: 3.2 g/dL — ABNORMAL LOW (ref 3.5–5.0)
Alkaline Phosphatase: 46 U/L (ref 38–126)
Anion gap: 8 (ref 5–15)
BUN: 15 mg/dL (ref 8–23)
CO2: 28 mmol/L (ref 22–32)
Calcium: 8.8 mg/dL — ABNORMAL LOW (ref 8.9–10.3)
Chloride: 100 mmol/L (ref 98–111)
Creatinine: 0.9 mg/dL (ref 0.61–1.24)
GFR, Estimated: >60 mL/min (ref >60)
Glucose, Bld: 105 mg/dL — ABNORMAL HIGH (ref 70–99)
Potassium: 4.2 mmol/L (ref 3.5–5.1)
Sodium: 136 mmol/L (ref 135–145)
Total Bilirubin: 1.1 mg/dL (ref 0.0–1.2)
Total Protein: 6.3 g/dL — ABNORMAL LOW (ref 6.5–8.1)

## 2024-02-15 LAB — CBC WITH DIFFERENTIAL (CANCER CENTER ONLY)
Abs Immature Granulocytes: 0.09 K/uL — ABNORMAL HIGH (ref 0.00–0.07)
Basophils Absolute: 0 K/uL (ref 0.0–0.1)
Basophils Relative: 0 %
Eosinophils Absolute: 0.3 K/uL (ref 0.0–0.5)
Eosinophils Relative: 3 %
HCT: 43.1 % (ref 39.0–52.0)
Hemoglobin: 14.5 g/dL (ref 13.0–17.0)
Immature Granulocytes: 1 %
Lymphocytes Relative: 6 %
Lymphs Abs: 0.5 K/uL — ABNORMAL LOW (ref 0.7–4.0)
MCH: 29.3 pg (ref 26.0–34.0)
MCHC: 33.6 g/dL (ref 30.0–36.0)
MCV: 87.1 fL (ref 80.0–100.0)
Monocytes Absolute: 0.9 K/uL (ref 0.1–1.0)
Monocytes Relative: 11 %
Neutro Abs: 6.8 K/uL (ref 1.7–7.7)
Neutrophils Relative %: 79 %
Platelet Count: 106 K/uL — ABNORMAL LOW (ref 150–400)
RBC: 4.95 MIL/uL (ref 4.22–5.81)
RDW: 15.9 % — ABNORMAL HIGH (ref 11.5–15.5)
WBC Count: 8.7 K/uL (ref 4.0–10.5)
nRBC: 0 % (ref 0.0–0.2)

## 2024-02-15 MED ORDER — LENALIDOMIDE 20 MG PO CAPS: 20.0000 mg | ORAL_CAPSULE | Freq: Every day | ORAL | 0 refills | Status: DC

## 2024-02-15 MED ORDER — DARATUMUMAB-HYALURONIDASE-FIHJ 1800-30000 MG-UT/15ML ~~LOC~~ SOLN
1800.0000 mg | Freq: Once | SUBCUTANEOUS | Status: AC
  Administered 2024-02-15: 1800 mg via SUBCUTANEOUS
  Filled 2024-02-15: qty 15

## 2024-02-15 MED ORDER — MONTELUKAST SODIUM 10 MG PO TABS: 10.0000 mg | ORAL_TABLET | ORAL | Status: DC

## 2024-02-15 MED ORDER — ACETAMINOPHEN 325 MG PO TABS: 650.0000 mg | ORAL_TABLET | Freq: Once | ORAL | Status: DC

## 2024-02-15 MED ORDER — BORTEZOMIB CHEMO SQ INJECTION 3.5 MG (2.5MG/ML)
1.3000 mg/m2 | Freq: Once | INTRAMUSCULAR | Status: AC
  Administered 2024-02-15: 3 mg via SUBCUTANEOUS
  Filled 2024-02-15: qty 1.2

## 2024-02-15 MED ORDER — DEXAMETHASONE 4 MG PO TABS: ORAL_TABLET | ORAL | 0 refills | Status: DC

## 2024-02-15 MED ORDER — DEXAMETHASONE 4 MG PO TABS: 20.0000 mg | ORAL_TABLET | Freq: Once | ORAL | Status: DC

## 2024-02-15 MED ORDER — DIPHENHYDRAMINE HCL 25 MG PO CAPS: 25.0000 mg | ORAL_CAPSULE | Freq: Once | ORAL | Status: DC

## 2024-02-15 NOTE — Assessment & Plan Note (Addendum)
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   S/P evaluation with Dr.Choi [facility]- bone marrow evaluation]. TRANSPLANT ELIGIBLE. 4/25- Currently on Dara-RVD [q 21 d cycle- Griffin trial-status post 5 cycles-bone marrow biopsy [[facility]]-November 28, 2023-MRD -LOW POSITIVE [2 cell/million].  Status post stem cell collection patient declines stem cell transplant.  Proceed with consolidation chemotherapy total of 9 cycles. [facility] SEP 2025- PET scan-negative for any acute myelomatous lesions in the bone.  # Proceed with cycle number 6 day 1- Dara-Rev-Dex [ [q 21 d cycle- Griffin trial-; Revlimid  at 20 mg [dose reduced sec  rash].  Continue current therapy-continue close monitoring of myeloma labs.  # Secondary hypogammaglobinemia-IVIG infusions every 3- 4 weeks.  No infections noted.  #  rash on the face- improved  with rev- 20 mg. Monitor closely.   # 2025- PET Scan incidental-  Some ill-defined activity above blood pool is seen at the left inferior  aspect of the prostate. This is nonspecific but suspect prostatitis.  Awaiting prostate MRI /rology evaluation.  # Multiple bone lesion-- [per pt s/p dental clearance] -Continue ca+Vit D BID. [Vit D 1000/d; 5000 q OD]. vit D 28 [June 2025]-recommend 50,000 units vitamin D  once a week; and recommend calcium 1000 mg once a day- Stable. Zometa   # History of a flutter [s/p ablation in [place] - clip of Left atrial appendage]-no anticoagulation- on asprin; irregular rhythm-2D echo pending.  Stable.  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  # DVT-ID Prophylaxis: asprin/acyclovir -  Stable.  # ACP: had Advance directives.   # # IV access:PIV   # 1-4 cycles-SQ Dara-velcade  weekly x4 cycles; 5-6 dara-q2 w; [place];  Zometa-??   MD appt- q 3W  Pre-meds-?PS  # MRI- on 11/15- prostate cancer  # DISPOSITION: # q 4 weeks- MM; K/L light chains # chemo today; and as per IS # chemo today-  # as scheduled # will add more appts later today- Dr.B

## 2024-02-15 NOTE — Progress Notes (Signed)
 Needs refills on dexamethasone  and revlimid (needs auth), pended.  Having mri of prostate tomorrow.  Having more fatigue, sleeping a lot. The skin around eyes is dark.  He has questions about the biopsy he had done 11/28/23.

## 2024-02-15 NOTE — Progress Notes (Signed)
 Pt states he took tylenol , benadryl , singulair , dexamethasone  prior to treatment.

## 2024-02-15 NOTE — Progress Notes (Signed)
 Jimmy [name]  Patient Care Team: [name], MD as PCP - General (Internal Medicine) Cindie Ole DASEN, MD as PCP - Electrophysiology (Cardiology) Rennie Cindy SAUNDERS, MD as Consulting Physician (Oncology)  CHIEF COMPLAINTS/PURPOSE OF CONSULTATION: Multiple Myeloma   Oncology History Overview [name]  # LIGHT CHAIN MONOCLONAL GAMMOPATHY -DEC 2024-[PCP-SPEP-NEGATIVE; Hb 13; GFR-> 60 ca-WN; However, random UPEP- positive for M protein/Bence-Jones- Protein positive; kappa type.  JAN 2025- SPEP- NEG; K/l= 91 [K=400]. Dx= includes smoldering MM vs active MM. 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)   06/05/2023 Cancer Staging   Staging form: Plasma Cell Myeloma and Plasma Cell Disorders, AJCC 8th Edition - Clinical: Albumin (g/dL): 4, ISS: Stage II, High-risk cytogenetics: Absent, LDH: Normal - Signed by Rennie Cindy SAUNDERS, MD on 06/05/2023 Albumin range (g/dL): Greater than or equal to 3.5 Cytogenetics: t(11;14) translocation   06/06/2023 Cancer Staging   Staging form: Plasma Cell Myeloma and Plasma Cell Disorders, AJCC 8th Edition - Clinical: RISS Stage I (Beta-2 -microglobulin (mg/L): 2.5, Albumin (g/dL): 4, ISS: Stage I, High-risk cytogenetics: Absent, LDH: Normal) - Signed by  Rennie Cindy SAUNDERS, MD on 06/06/2023 Stage prefix: Initial diagnosis Beta 2 microglobulin range (mg/L): Less than 3.5 Albumin range (g/dL): Greater than or equal to 3.5 Cytogenetics: t(11;14) translocation   07/27/2023 -  Chemotherapy   Patient is on Treatment Plan : MYELOMA NEWLY DIAGNOSED TRANSPLANT CANDIDATE DaraVRd (Daratumumab  SQ) with weekly bortezomib  (D1,8,15) q21d x 6 Cycles (Induction/Consolidation)      HISTORY OF PRESENTING ILLNESS: Patient ambulating-independently. Accompanied by wife.   Jimmy [name] 68 y.o.  male with A.fib [watchman device; not on anti-coagulation] standard risk- Multiple Myeloma [dx: march 2025] with multiple bone lesions-currently status post induction DARA-RVD-is here to proceed with consolidation Dara-RVD is here for a follow-up.   Discussed the use of AI scribe software for clinical [name] transcription with the patient, who gave verbal consent to proceed.  History of Present Illness   Jimmy [name] is a 68 year old male with multiple myeloma who presents for a follow-up visit.  He has been undergoing treatment for multiple myeloma since April 2025, which includes Revlimid  and Dexamethasone . Revlimid  is obtained from [facility] and Dexamethasone  from [employer] [facility]. There has been confusion regarding the timing of Dexamethasone  administration, initially prescribed with Darzalex  and later with Velcade  infusions.  He has experienced significant weight gain since starting treatment, increasing from a baseline of 221 pounds to a current weight of 253 pounds, with a peak at 260 pounds. He attributes this weight gain to steroid use, which stimulates his appetite, leading to increased food intake.  He has not experienced nausea and has not taken Compazine , although it was offered as a premedication for Velcade . He typically takes Dexamethasone , Benadryl , and Tylenol  as premedications.  He has an upcoming MRI for his prostate scheduled for February 16, 2024,  with a potential follow-up biopsy depending on the results.  He has recently been prescribed an extended-release formulation of Metoprolol for his heart condition, which he finds beneficial as it simplifies his medication schedule. He is awaiting a follow-up with a new electrophysiologist.      Review of Systems  Constitutional:  Negative for chills, diaphoresis, fever and weight loss.  HENT:  Negative for nosebleeds and sore throat.   Eyes:  Negative for double vision.  Respiratory:  Negative for cough, hemoptysis, sputum production, shortness of breath and wheezing.   Cardiovascular:  Negative for chest pain, palpitations, orthopnea and leg swelling.  Gastrointestinal:  Negative for abdominal pain, blood in stool, constipation, diarrhea, heartburn, melena, nausea and vomiting.  Genitourinary:  Negative for dysuria, frequency and urgency.  Musculoskeletal:  Negative for back pain and joint pain.  Skin: Negative.  Negative for itching and rash.  Neurological:  Negative for dizziness, tingling, focal weakness, weakness and headaches.  Endo/Heme/Allergies:  Does not bruise/bleed easily.  Psychiatric/Behavioral:  Negative for depression. The patient is not nervous/anxious and does not have insomnia.     MEDICAL HISTORY:  Past Medical History:  Diagnosis Date   Atrial fibrillation (HCC)    had loop recorder, PAcs after DCCV 09/2019   Atrial flutter (HCC)    Fatigue    HLD (hyperlipidemia)    On amiodarone therapy 09/15/2019   Pain of right lower leg 01/02/2022    SURGICAL HISTORY: Past Surgical History:  Procedure Laterality Date   afib surgical ablation  2021   CARDIOVERSION     2021 for Afib in [place]   CHOLECYSTECTOMY     GALLBLADDER SURGERY  2006   IR BONE MARROW BIOPSY & ASPIRATION  05/18/2023   MINIMALLY INVASIVE MAZE PROCEDURE     done in [place]  Dr R. Gala   stye Right 09/2022    SOCIAL HISTORY: Social History   Socioeconomic History   Marital status: Married    Spouse  name: Not on file   Number of children: Not on file   Years of education: Not on file   Highest education level: Bachelor's degree (e.g., BA, AB, BS)  Occupational History   Not on file  Tobacco Use   Smoking status: Never   Smokeless tobacco: Never  Vaping Use   Vaping status: Never Used  Substance and Sexual Activity   Alcohol use: Never   Drug use: Not Currently   Sexual activity: Not Currently  Other Topics Concern   Not on file  Social History Narrative   2 sons and 1 daughter    Married    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    No guns, wears seat belt, safe in relationship   Vegan x 8-9 years as of 07/06/20    Social Drivers of Corporate Investment Banker Strain: Low Risk  (12/28/2023)   Overall Financial Resource Strain (CARDIA)    Difficulty of Paying Living Expenses: Not hard at all  Food Insecurity: No Food Insecurity (12/28/2023)   Hunger Vital Sign    Worried About Running Out of Food in the Last Year: Never true    Ran Out of Food in the Last Year: Never true  Transportation Needs: No Transportation Needs (12/28/2023)   PRAPARE - Administrator, Civil Service (Medical): No    Lack of Transportation (Non-Medical): No  Physical Activity: Inactive (12/28/2023)   Exercise Vital Sign    Days of  Exercise per Week: 0 days    Minutes of Exercise per Session: Not on file  Stress: No Stress Concern Present (12/28/2023)   [name]    Feeling of Stress: Not at all  Social Connections: Socially Isolated (12/28/2023)   Social Connection and Isolation Panel    Frequency of Communication with Friends and Family: Once a week    Frequency of Social Gatherings with Friends and Family: Never    Attends Religious Services: Never    Database Administrator or Organizations: No    Attends Engineer, Structural: Not on file    Marital Status: Married  Catering Manager Violence: Not At  Risk (04/16/2023)   Humiliation, Afraid, Rape, and Kick questionnaire    Fear of Current or Ex-Partner: No    Emotionally Abused: No    Physically Abused: No    Sexually Abused: No    FAMILY HISTORY: Family History  Problem Relation Age of Onset   COPD Mother    Atrial fibrillation Mother        ? due to Afib died 38   Cancer Father        lung cancer smoker age 66    Heart disease Father        bypass at 3    Atrial fibrillation Sister     ALLERGIES:  has no known allergies.  MEDICATIONS:  Current Outpatient Medications  Medication Sig Dispense Refill   metoprolol succinate (TOPROL-XL) 50 MG 24 hr tablet Take 1 tablet (50 mg total) by mouth daily. Take with or immediately following a meal. 90 tablet 3   metoprolol tartrate (LOPRESSOR) 25 MG tablet Take 1 tablet (25 mg total) by mouth every 4 (four) hours as needed (for sustained heartrate greater than 115 beats per minute).     acetaminophen  (TYLENOL ) 325 MG tablet Take 650 mg by mouth once a week. Take 1 hour prior to infusion appointment.     acyclovir  (ZOVIRAX ) 400 MG tablet Take 1 tablet (400 mg total) by mouth 2 (two) times daily. 180 tablet 1   ASPIRIN 81 PO Take 81 mg by mouth daily.     cyanocobalamin  1000 MCG tablet Take 2,000 mcg by mouth.     dexamethasone  (DECADRON ) 4 MG tablet Take 5 tabs (20mg ) by mouth 1 hour prior to  Dara injection appointment and take 5 tabs (20mg ) the day after weekly infusion appointment. Take with with food . 30 tablet 0   diphenhydrAMINE  (BENADRYL ) 50 MG capsule Take 50 mg by mouth once a week. Take 1 hour prior to infusion appointment.     ergocalciferol  (VITAMIN D2) 1.25 MG (50000 UT) capsule Take 1 capsule (50,000 Units total) by mouth once a week. 12 capsule 1   hydrOXYzine  (ATARAX ) 10 MG tablet Take 1 tablet (10 mg total) by mouth 3 (three) times daily as needed. 30 tablet 0   lenalidomide  (REVLIMID ) 20 MG capsule Take 1 capsule (20 mg total) by mouth daily. Take for 14 days, then hold  for 7 days. Repeat every 21 days. [[facility]] 14 capsule 0   montelukast  (SINGULAIR ) 10 MG tablet Take daily; and on days of infusion- Take 1 hour prior to infusion appointment. 90 tablet 1   Multiple Vitamin (CALCIUM COMPLEX PO) Take by mouth daily.     Omega-3 1400 MG CAPS      ondansetron  (ZOFRAN ) 8 MG tablet One pill every 8 hours as needed for nausea/vomitting. 40 tablet 1  prochlorperazine  (COMPAZINE ) 10 MG tablet Take 1 tablet (10 mg total) by mouth every 6 (six) hours as needed for nausea or vomiting. 40 tablet 1   Turmeric (CURCUMIN 95) 500 MG CAPS Take 500 mg by mouth 2 (two) times daily.     Vitamin D -Vitamin K (VITAMIN K2-VITAMIN D3 PO) Take by mouth daily.     No current facility-administered medications for this visit.   PHYSICAL EXAMINATION:   Vitals:   02/15/24 0929  BP: 97/69  Pulse: 75  Resp: 16  Temp: 98.4 F (36.9 C)  SpO2: 97%     Filed Weights   02/15/24 0929  Weight: 253 lb (114.8 kg)     Positive for bilateral gynecomastia.    Physical Exam Vitals and nursing [name] reviewed.  HENT:     Head: Normocephalic and atraumatic.     Mouth/Throat:     Pharynx: Oropharynx is clear.  Eyes:     Extraocular Movements: Extraocular movements intact.     Pupils: Pupils are equal, round, and reactive to light.  Cardiovascular:     Rate and Rhythm: Normal rate and regular rhythm.  Abdominal:     Palpations: Abdomen is soft.  Musculoskeletal:        General: Normal range of motion.     Cervical back: Normal range of motion.  Skin:    General: Skin is warm.  Neurological:     General: No focal deficit present.     Mental Status: He is alert and oriented to person, place, and time.  Psychiatric:        Behavior: Behavior normal.        Judgment: Judgment normal.        LABORATORY DATA:  I have reviewed the data as listed Lab Results  Component Value Date   WBC 8.7 02/15/2024   HGB 14.5 02/15/2024   HCT 43.1 02/15/2024   MCV 87.1 02/15/2024    PLT 106 (L) 02/15/2024   Recent Labs    02/01/24 1419 02/08/24 0901 02/15/24 0920  NA 139 135 136  K 4.7 3.9 4.2  CL 103 100 100  CO2 27 28 28   GLUCOSE 113* 106* 105*  BUN 12 12 15   CREATININE 0.93 0.85 0.90  CALCIUM 8.4* 8.1* 8.8*  GFRNONAA >60 >60 >60  PROT 6.7 6.5 6.3*  ALBUMIN 3.3* 3.2* 3.2*  AST 19 21 18   ALT 22 23 30   ALKPHOS 50 49 46  BILITOT 0.8 0.8 1.1   MR PROSTATE W WO CONTRAST Result Date: 02/18/2024 EXAM: MRI PROSTATE WITH AND WITHOUT INTRAVENOUS CONTRAST 02/16/2024 10:36:11 AM TECHNIQUE: Multiparametric MRI imaging of the prostate with and without dynamic contrast enhanced imaging and diffusion weighted imaging was performed. CONTRAST: 10 mL of Vueway. Dynacad/CAD was utilized in analysis of images. COMPARISON: PET CT 05/29/2023. CLINICAL HISTORY: Elevated psa level of 7.02. FINDINGS: PROSTATE: Encapsulated nodularity in the transition zone compatible with benign prostatic hypertrophy. Prostate volume by 3d volumetric analysis: 43.4 cubic centimeters (5.3 x 3.9 x 4.3 cm). No significant abnormal findings of intermediate or higher suspicion for prostate cancer is identified. SEMINAL VESICLES: Unremarkable. NEUROVASCULAR BUNDLE: Unremarkable. LYMPHADENOPATHY: No lymphadenopathy. BLADDER: Mild accentuated mucosal enhancement along the left anterior urinary bladder wall for example on image 149 of series 12. No obvious mass like appearance; possibilities may include sessile tumor or inflammation. BOWEL: The visualized bowel is without acute abnormality. PERITONEAL CAVITY: No free fluid. BONES: Normal bone marrow signal intensity. No suspicious or aggressive osseous lesions. SOFT TISSUES: No focal abnormality.  IMPRESSION: 1. No significant abnormal findings of intermediate or higher suspicion for prostate cancer is identified. 2. Mild accentuated mucosal enhancement along the left anterior urinary bladder wall without obvious mass-like appearance, which may represent sessile tumor  or inflammation. Electronically signed by: Ryan Salvage MD 02/18/2024 12:20 PM EST RP Workstation: [id number]   CUP PACEART REMOTE DEVICE CHECK Result Date: 02/14/2024 ILR summary report received. Battery status OK. Normal device function. No new symptom, tachy, brady, or pause episodes. 9 new AF episodes, AF burden 99.9%, left atrial appendage closure. Monthly summary reports and ROV/PRN ML, CVRS    Lab Results  Component Value Date   KPAFRELGTCHN 5.1 02/08/2024   KPAFRELGTCHN 5.2 01/11/2024   KPAFRELGTCHN 4.8 12/28/2023   LAMBDASER 5.6 (L) 02/08/2024   LAMBDASER 3.9 (L) 01/11/2024   LAMBDASER 3.2 (L) 12/28/2023   KAPLAMBRATIO 0.91 02/08/2024   KAPLAMBRATIO 1.33 01/11/2024   KAPLAMBRATIO 1.50 12/28/2023     Multiple myeloma in relapse (HCC)  # DEC-JAN 2025- STANDARD RISK CYTOGENETICS LIGHT CHAIN MONOCLONAL GAMMOPATHY -DEC 2024-MARCH 2025-PET Numerous lytic myelomatous bone lesions throughout the axial and appendicular skeleton noted on the CT scan without associated discrete significant hypermetabolism.   S/P evaluation with Dr.Choi [facility]- bone marrow evaluation]. TRANSPLANT ELIGIBLE. 4/25- Currently on Dara-RVD [q 21 d cycle- Griffin trial-status post 5 cycles-bone marrow biopsy [[facility]]-November 28, 2023-MRD -LOW POSITIVE [2 cell/million].  Status post stem cell collection patient declines stem cell transplant.  Proceed with consolidation chemotherapy total of 9 cycles. [facility] SEP 2025- PET scan-negative for any acute myelomatous lesions in the bone.  # Proceed with cycle number 6 day 1- Dara-Rev-Dex [ [q 21 d cycle- Griffin trial-; Revlimid  at 20 mg [dose reduced sec  rash].  Continue current therapy-continue close monitoring of myeloma labs.  # Secondary hypogammaglobinemia-IVIG infusions every 3- 4 weeks.  No infections noted.  #  rash on the face- improved  with rev- 20 mg. Monitor closely.   # 2025- PET Scan incidental-  Some ill-defined activity above blood pool is seen at the  left inferior  aspect of the prostate. This is nonspecific but suspect prostatitis.  Awaiting prostate MRI /rology evaluation.  # Multiple bone lesion-- [per pt s/p dental clearance] -Continue ca+Vit D BID. [Vit D 1000/d; 5000 q OD]. vit D 28 [June 2025]-recommend 50,000 units vitamin D  once a week; and recommend calcium 1000 mg once a day- Stable. Zometa   # History of a flutter [s/p ablation in [place] - clip of Left atrial appendage]-no anticoagulation- on asprin; irregular rhythm-2D echo pending.  Stable.  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  # DVT-ID Prophylaxis: asprin/acyclovir -  Stable.  # ACP: had Advance directives.   # # IV access:PIV   # 1-4 cycles-SQ Dara-velcade  weekly x4 cycles; 5-6 dara-q2 w; [place]; Zometa-??   MD appt- q 3W  Pre-meds-?PS  # MRI- on 11/15- prostate cancer  # DISPOSITION: # q 4 weeks- MM; K/L light chains # chemo today; and as per IS # chemo today-  # as scheduled # will add more appts later today- Dr.B   All questions were answered. The patient knows to call the clinic with any problems, questions or concerns.    Cindy JONELLE Joe, MD 02/19/2024 8:40 PM

## 2024-02-15 NOTE — Patient Instructions (Signed)
 [facility] - A DEPT OF [facility]  Discharge Instructions: Thank you for choosing [facility] to provide your oncology and hematology care.  If you have a lab appointment with the Cancer Center, please go directly to the Cancer Center and check in at the registration area.  Wear comfortable clothing and clothing appropriate for easy access to any Portacath or PICC line.   We strive to give you quality time with your provider. You may need to reschedule your appointment if you arrive late (15 or more minutes).  Arriving late affects you and other patients whose appointments are after yours.  Also, if you miss three or more appointments without notifying the office, you may be dismissed from the clinic at the provider's discretion.      For prescription refill requests, have your pharmacy contact our office and allow 72 hours for refills to be completed.    Today you received the following chemotherapy and/or immunotherapy agents- velcade      To help prevent nausea and vomiting after your treatment, we encourage you to take your nausea medication as directed.  BELOW ARE SYMPTOMS THAT SHOULD BE REPORTED IMMEDIATELY: *FEVER GREATER THAN 100.4 F (38 C) OR HIGHER *CHILLS OR SWEATING *NAUSEA AND VOMITING THAT IS NOT CONTROLLED WITH YOUR NAUSEA MEDICATION *UNUSUAL SHORTNESS OF BREATH *UNUSUAL BRUISING OR BLEEDING *URINARY PROBLEMS (pain or burning when urinating, or frequent urination) *BOWEL PROBLEMS (unusual diarrhea, constipation, pain near the anus) TENDERNESS IN MOUTH AND THROAT WITH OR WITHOUT PRESENCE OF ULCERS (sore throat, sores in mouth, or a toothache) UNUSUAL RASH, SWELLING OR PAIN  UNUSUAL VAGINAL DISCHARGE OR ITCHING   Items with * indicate a potential emergency and should be followed up as soon as possible or go to the Emergency Department if any problems should occur.  Please show the CHEMOTHERAPY ALERT CARD or IMMUNOTHERAPY  ALERT CARD at check-in to the Emergency Department and triage nurse.  Should you have questions after your visit or need to cancel or reschedule your appointment, please contact [facility] - A DEPT OF [facility]  [phone] and follow the prompts.  Office hours are 8:00 a.m. to 4:30 p.m. Monday - Friday. Please note that voicemails left after 4:00 p.m. may not be returned until the following business day.  We are closed weekends and major holidays. You have access to a nurse at all times for urgent questions. Please call the main number to the clinic [phone] and follow the prompts.  For any non-urgent questions, you may also contact your provider using MyChart. We now offer e-Visits for anyone 47 and older to request care online for non-urgent symptoms. For details visit [email or web address].   Also download the MyChart app! Go to the app store, search "MyChart", open the app, select [place], and log in with your MyChart username and password.

## 2024-02-16 ENCOUNTER — Ambulatory Visit
Admission: RE | Admit: 2024-02-16 | Discharge: 2024-02-16 | Disposition: A | Discharge status: Home / Self Care | Source: Ambulatory Visit | Attending: Urology | Admitting: Urology

## 2024-02-16 DIAGNOSIS — N403 Nodular prostate with lower urinary tract symptoms: Secondary | ICD-10-CM

## 2024-02-16 DIAGNOSIS — R972 Elevated prostate specific antigen [PSA]: Secondary | ICD-10-CM

## 2024-02-16 MED ORDER — GADOPICLENOL 0.5 MMOL/ML IV SOLN
10.0000 mL | Freq: Once | INTRAVENOUS | Status: AC | PRN
  Administered 2024-02-16: 10 mL via INTRAVENOUS

## 2024-02-19 ENCOUNTER — Encounter: Payer: Self-pay | Admitting: Internal Medicine

## 2024-02-19 NOTE — Progress Notes (Signed)
 Remote Loop Recorder Transmission

## 2024-02-20 ENCOUNTER — Other Ambulatory Visit: Payer: Self-pay | Admitting: Internal Medicine

## 2024-02-20 ENCOUNTER — Inpatient Hospital Stay

## 2024-02-20 NOTE — Progress Notes (Signed)
 Pt out of town  on 12/19- appts dates adjusted for TG; X-mas. DW Rosaline.  Plan to start maintenance after cycle #9-  GB

## 2024-02-21 ENCOUNTER — Inpatient Hospital Stay

## 2024-02-21 VITALS — BP 120/73 | HR 55 | Temp 97.2°F | Resp 18 | Ht 71.0 in | Wt 261.7 lb

## 2024-02-21 DIAGNOSIS — C9002 Multiple myeloma in relapse: Secondary | ICD-10-CM

## 2024-02-21 DIAGNOSIS — Z5112 Encounter for antineoplastic immunotherapy: Secondary | ICD-10-CM | POA: Diagnosis not present

## 2024-02-21 MED ORDER — DEXTROSE 5 % IV SOLN
INTRAVENOUS | Status: DC
  Filled 2024-02-21: qty 250

## 2024-02-21 MED ORDER — ACETAMINOPHEN 325 MG PO TABS
650.0000 mg | ORAL_TABLET | Freq: Once | ORAL | Status: AC
  Administered 2024-02-21: 650 mg via ORAL
  Filled 2024-02-21: qty 2

## 2024-02-21 MED ORDER — IMMUNE GLOBULIN (HUMAN) 10 GM/100ML IV SOLN
400.0000 mg/kg | Freq: Once | INTRAVENOUS | Status: AC
  Administered 2024-02-21: 35 g via INTRAVENOUS
  Filled 2024-02-21: qty 200

## 2024-02-21 MED ORDER — DIPHENHYDRAMINE HCL 25 MG PO TABS
25.0000 mg | ORAL_TABLET | Freq: Once | ORAL | Status: AC
  Administered 2024-02-21: 25 mg via ORAL

## 2024-02-22 ENCOUNTER — Inpatient Hospital Stay

## 2024-02-22 VITALS — BP 136/72 | HR 69 | Temp 97.0°F | Resp 19

## 2024-02-22 DIAGNOSIS — C9002 Multiple myeloma in relapse: Secondary | ICD-10-CM

## 2024-02-22 DIAGNOSIS — Z5112 Encounter for antineoplastic immunotherapy: Secondary | ICD-10-CM | POA: Diagnosis not present

## 2024-02-22 LAB — CMP (CANCER CENTER ONLY)
ALT: 46 U/L — ABNORMAL HIGH (ref 0–44)
AST: 28 U/L (ref 15–41)
Albumin: 3.1 g/dL — ABNORMAL LOW (ref 3.5–5.0)
Alkaline Phosphatase: 45 U/L (ref 38–126)
Anion gap: 7 (ref 5–15)
BUN: 16 mg/dL (ref 8–23)
CO2: 27 mmol/L (ref 22–32)
Calcium: 8.2 mg/dL — ABNORMAL LOW (ref 8.9–10.3)
Chloride: 102 mmol/L (ref 98–111)
Creatinine: 0.81 mg/dL (ref 0.61–1.24)
GFR, Estimated: >60 mL/min (ref >60)
Glucose, Bld: 100 mg/dL — ABNORMAL HIGH (ref 70–99)
Potassium: 4.8 mmol/L (ref 3.5–5.1)
Sodium: 136 mmol/L (ref 135–145)
Total Bilirubin: 0.7 mg/dL (ref 0.0–1.2)
Total Protein: 6.3 g/dL — ABNORMAL LOW (ref 6.5–8.1)

## 2024-02-22 LAB — CBC WITH DIFFERENTIAL (CANCER CENTER ONLY)
Abs Immature Granulocytes: 0.06 K/uL (ref 0.00–0.07)
Basophils Absolute: 0 K/uL (ref 0.0–0.1)
Basophils Relative: 0 %
Eosinophils Absolute: 0.2 K/uL (ref 0.0–0.5)
Eosinophils Relative: 3 %
HCT: 37.7 % — ABNORMAL LOW (ref 39.0–52.0)
Hemoglobin: 12.8 g/dL — ABNORMAL LOW (ref 13.0–17.0)
Immature Granulocytes: 1 %
Lymphocytes Relative: 9 %
Lymphs Abs: 0.5 K/uL — ABNORMAL LOW (ref 0.7–4.0)
MCH: 29.6 pg (ref 26.0–34.0)
MCHC: 34 g/dL (ref 30.0–36.0)
MCV: 87.1 fL (ref 80.0–100.0)
Monocytes Absolute: 1 K/uL (ref 0.1–1.0)
Monocytes Relative: 17 %
Neutro Abs: 4 K/uL (ref 1.7–7.7)
Neutrophils Relative %: 70 %
Platelet Count: 120 K/uL — ABNORMAL LOW (ref 150–400)
RBC: 4.33 MIL/uL (ref 4.22–5.81)
RDW: 15.9 % — ABNORMAL HIGH (ref 11.5–15.5)
WBC Count: 5.8 K/uL (ref 4.0–10.5)
nRBC: 0 % (ref 0.0–0.2)

## 2024-02-22 MED ORDER — DEXAMETHASONE 4 MG PO TABS: 20.0000 mg | ORAL_TABLET | Freq: Once | ORAL | Status: DC

## 2024-02-22 MED ORDER — BORTEZOMIB CHEMO SQ INJECTION 3.5 MG (2.5MG/ML)
1.3000 mg/m2 | Freq: Once | INTRAMUSCULAR | Status: AC
  Administered 2024-02-22: 3 mg via SUBCUTANEOUS
  Filled 2024-02-22: qty 1.2

## 2024-02-22 MED ORDER — PROCHLORPERAZINE MALEATE 10 MG PO TABS: 10.0000 mg | ORAL_TABLET | Freq: Once | ORAL | Status: DC

## 2024-03-03 ENCOUNTER — Ambulatory Visit: Payer: Self-pay | Admitting: Cardiovascular Disease

## 2024-03-06 ENCOUNTER — Other Ambulatory Visit (HOSPITAL_COMMUNITY): Payer: Self-pay | Admitting: Urology

## 2024-03-06 ENCOUNTER — Other Ambulatory Visit: Payer: Self-pay | Admitting: Urology

## 2024-03-06 DIAGNOSIS — R972 Elevated prostate specific antigen [PSA]: Secondary | ICD-10-CM

## 2024-03-06 DIAGNOSIS — Z8551 Personal history of malignant neoplasm of bladder: Secondary | ICD-10-CM

## 2024-03-07 ENCOUNTER — Other Ambulatory Visit: Payer: Self-pay | Admitting: Internal Medicine

## 2024-03-07 ENCOUNTER — Inpatient Hospital Stay: Attending: Internal Medicine

## 2024-03-07 ENCOUNTER — Other Ambulatory Visit

## 2024-03-07 ENCOUNTER — Encounter: Payer: Self-pay | Admitting: Internal Medicine

## 2024-03-07 ENCOUNTER — Inpatient Hospital Stay

## 2024-03-07 ENCOUNTER — Ambulatory Visit

## 2024-03-07 VITALS — BP 121/65 | HR 77 | Temp 97.8°F | Resp 18 | Ht 71.0 in | Wt 255.7 lb

## 2024-03-07 DIAGNOSIS — Z7982 Long term (current) use of aspirin: Secondary | ICD-10-CM | POA: Insufficient documentation

## 2024-03-07 DIAGNOSIS — Z79624 Long term (current) use of inhibitors of nucleotide synthesis: Secondary | ICD-10-CM | POA: Diagnosis not present

## 2024-03-07 DIAGNOSIS — D801 Nonfamilial hypogammaglobulinemia: Secondary | ICD-10-CM | POA: Diagnosis present

## 2024-03-07 DIAGNOSIS — Z9481 Bone marrow transplant status: Secondary | ICD-10-CM | POA: Diagnosis not present

## 2024-03-07 DIAGNOSIS — Z801 Family history of malignant neoplasm of trachea, bronchus and lung: Secondary | ICD-10-CM | POA: Insufficient documentation

## 2024-03-07 DIAGNOSIS — E785 Hyperlipidemia, unspecified: Secondary | ICD-10-CM | POA: Diagnosis not present

## 2024-03-07 DIAGNOSIS — F32A Depression, unspecified: Secondary | ICD-10-CM | POA: Diagnosis not present

## 2024-03-07 DIAGNOSIS — Z5112 Encounter for antineoplastic immunotherapy: Secondary | ICD-10-CM | POA: Diagnosis present

## 2024-03-07 DIAGNOSIS — Z7961 Long term (current) use of immunomodulator: Secondary | ICD-10-CM | POA: Insufficient documentation

## 2024-03-07 DIAGNOSIS — Z7969 Long term (current) use of other immunomodulators and immunosuppressants: Secondary | ICD-10-CM | POA: Insufficient documentation

## 2024-03-07 DIAGNOSIS — Z79899 Other long term (current) drug therapy: Secondary | ICD-10-CM | POA: Diagnosis not present

## 2024-03-07 DIAGNOSIS — C9002 Multiple myeloma in relapse: Secondary | ICD-10-CM | POA: Insufficient documentation

## 2024-03-07 DIAGNOSIS — I4891 Unspecified atrial fibrillation: Secondary | ICD-10-CM | POA: Diagnosis not present

## 2024-03-07 DIAGNOSIS — C9000 Multiple myeloma not having achieved remission: Secondary | ICD-10-CM

## 2024-03-07 LAB — CBC WITH DIFFERENTIAL (CANCER CENTER ONLY)
Abs Immature Granulocytes: 0.05 K/uL (ref 0.00–0.07)
Basophils Absolute: 0.1 K/uL (ref 0.0–0.1)
Basophils Relative: 2 %
Eosinophils Absolute: 0.1 K/uL (ref 0.0–0.5)
Eosinophils Relative: 3 %
HCT: 42.1 % (ref 39.0–52.0)
Hemoglobin: 13.9 g/dL (ref 13.0–17.0)
Immature Granulocytes: 1 %
Lymphocytes Relative: 9 %
Lymphs Abs: 0.5 K/uL — ABNORMAL LOW (ref 0.7–4.0)
MCH: 29 pg (ref 26.0–34.0)
MCHC: 33 g/dL (ref 30.0–36.0)
MCV: 87.9 fL (ref 80.0–100.0)
Monocytes Absolute: 0.8 K/uL (ref 0.1–1.0)
Monocytes Relative: 16 %
Neutro Abs: 3.5 K/uL (ref 1.7–7.7)
Neutrophils Relative %: 69 %
Platelet Count: 240 K/uL (ref 150–400)
RBC: 4.79 MIL/uL (ref 4.22–5.81)
RDW: 15.9 % — ABNORMAL HIGH (ref 11.5–15.5)
WBC Count: 5.1 K/uL (ref 4.0–10.5)
nRBC: 0 % (ref 0.0–0.2)

## 2024-03-07 LAB — CMP (CANCER CENTER ONLY)
ALT: 22 U/L (ref 0–44)
AST: 24 U/L (ref 15–41)
Albumin: 3.9 g/dL (ref 3.5–5.0)
Alkaline Phosphatase: 58 U/L (ref 38–126)
Anion gap: 9 (ref 5–15)
BUN: 9 mg/dL (ref 8–23)
CO2: 29 mmol/L (ref 22–32)
Calcium: 9.4 mg/dL (ref 8.9–10.3)
Chloride: 102 mmol/L (ref 98–111)
Creatinine: 0.87 mg/dL (ref 0.61–1.24)
GFR, Estimated: >60 mL/min (ref >60)
Glucose, Bld: 109 mg/dL — ABNORMAL HIGH (ref 70–99)
Potassium: 4.1 mmol/L (ref 3.5–5.1)
Sodium: 140 mmol/L (ref 135–145)
Total Bilirubin: 0.6 mg/dL (ref 0.0–1.2)
Total Protein: 6.5 g/dL (ref 6.5–8.1)

## 2024-03-07 MED ORDER — BORTEZOMIB CHEMO SQ INJECTION 3.5 MG (2.5MG/ML)
1.3000 mg/m2 | Freq: Once | INTRAMUSCULAR | Status: AC
  Administered 2024-03-07: 3 mg via SUBCUTANEOUS
  Filled 2024-03-07: qty 1.2

## 2024-03-07 NOTE — Patient Instructions (Signed)
 [facility] - A DEPT OF [facility]  Discharge Instructions: Thank you for choosing [facility] to provide your oncology and hematology care.  If you have a lab appointment with the Cancer Center, please go directly to the Cancer Center and check in at the registration area.  Wear comfortable clothing and clothing appropriate for easy access to any Portacath or PICC line.   We strive to give you quality time with your provider. You may need to reschedule your appointment if you arrive late (15 or more minutes).  Arriving late affects you and other patients whose appointments are after yours.  Also, if you miss three or more appointments without notifying the office, you may be dismissed from the clinic at the provider's discretion.      For prescription refill requests, have your pharmacy contact our office and allow 72 hours for refills to be completed.    Today you received the following chemotherapy and/or immunotherapy agents Velcade       To help prevent nausea and vomiting after your treatment, we encourage you to take your nausea medication as directed.  BELOW ARE SYMPTOMS THAT SHOULD BE REPORTED IMMEDIATELY: *FEVER GREATER THAN 100.4 F (38 C) OR HIGHER *CHILLS OR SWEATING *NAUSEA AND VOMITING THAT IS NOT CONTROLLED WITH YOUR NAUSEA MEDICATION *UNUSUAL SHORTNESS OF BREATH *UNUSUAL BRUISING OR BLEEDING *URINARY PROBLEMS (pain or burning when urinating, or frequent urination) *BOWEL PROBLEMS (unusual diarrhea, constipation, pain near the anus) TENDERNESS IN MOUTH AND THROAT WITH OR WITHOUT PRESENCE OF ULCERS (sore throat, sores in mouth, or a toothache) UNUSUAL RASH, SWELLING OR PAIN  UNUSUAL VAGINAL DISCHARGE OR ITCHING   Items with * indicate a potential emergency and should be followed up as soon as possible or go to the Emergency Department if any problems should occur.  Please show the CHEMOTHERAPY ALERT CARD or IMMUNOTHERAPY  ALERT CARD at check-in to the Emergency Department and triage nurse.  Should you have questions after your visit or need to cancel or reschedule your appointment, please contact [facility] - A DEPT OF [facility]  [phone] and follow the prompts.  Office hours are 8:00 a.m. to 4:30 p.m. Monday - Friday. Please note that voicemails left after 4:00 p.m. may not be returned until the following business day.  We are closed weekends and major holidays. You have access to a nurse at all times for urgent questions. Please call the main number to the clinic [phone] and follow the prompts.  For any non-urgent questions, you may also contact your provider using MyChart. We now offer e-Visits for anyone 63 and older to request care online for non-urgent symptoms. For details visit [email or web address].   Also download the MyChart app! Go to the app store, search MyChart, open the app, select [facility], and log in with your MyChart username and password.

## 2024-03-10 ENCOUNTER — Encounter: Payer: Self-pay | Admitting: Internal Medicine

## 2024-03-10 LAB — MULTIPLE MYELOMA PANEL, SERUM
Albumin SerPl Elph-Mcnc: 3.2 g/dL (ref 2.9–4.4)
Albumin/Glob SerPl: 1.2 (ref 0.7–1.7)
Alpha 1: 0.2 g/dL (ref 0.0–0.4)
Alpha2 Glob SerPl Elph-Mcnc: 0.9 g/dL (ref 0.4–1.0)
B-Globulin SerPl Elph-Mcnc: 0.9 g/dL (ref 0.7–1.3)
Gamma Glob SerPl Elph-Mcnc: 0.6 g/dL (ref 0.4–1.8)
Globulin, Total: 2.8 g/dL (ref 2.2–3.9)
IgA: 26 mg/dL — ABNORMAL LOW (ref 61–437)
IgG (Immunoglobin G), Serum: 746 mg/dL (ref 603–1613)
IgM (Immunoglobulin M), Srm: 15 mg/dL — ABNORMAL LOW (ref 20–172)
Total Protein ELP: 6 g/dL (ref 6.0–8.5)

## 2024-03-10 LAB — KAPPA/LAMBDA LIGHT CHAINS
Kappa free light chain: 5.5 mg/L (ref 3.3–19.4)
Kappa, lambda light chain ratio: 1.49 (ref 0.26–1.65)
Lambda free light chains: 3.7 mg/L — ABNORMAL LOW (ref 5.7–26.3)

## 2024-03-10 MED ORDER — LENALIDOMIDE 20 MG PO CAPS: 20.0000 mg | ORAL_CAPSULE | Freq: Every day | ORAL | 0 refills | Status: DC

## 2024-03-13 ENCOUNTER — Inpatient Hospital Stay

## 2024-03-13 VITALS — BP 121/85 | HR 66 | Temp 97.0°F | Resp 16 | Wt 256.8 lb

## 2024-03-13 DIAGNOSIS — Z5112 Encounter for antineoplastic immunotherapy: Secondary | ICD-10-CM | POA: Diagnosis not present

## 2024-03-13 DIAGNOSIS — C9002 Multiple myeloma in relapse: Secondary | ICD-10-CM

## 2024-03-13 MED ORDER — IMMUNE GLOBULIN (HUMAN) 10 GM/100ML IV SOLN
400.0000 mg/kg | Freq: Once | INTRAVENOUS | Status: AC
  Administered 2024-03-13: 35 g via INTRAVENOUS
  Filled 2024-03-13: qty 200

## 2024-03-13 MED ORDER — DIPHENHYDRAMINE HCL 25 MG PO TABS
25.0000 mg | ORAL_TABLET | Freq: Once | ORAL | Status: AC
  Administered 2024-03-13: 25 mg via ORAL
  Filled 2024-03-13: qty 1

## 2024-03-13 MED ORDER — ACETAMINOPHEN 325 MG PO TABS
650.0000 mg | ORAL_TABLET | Freq: Once | ORAL | Status: AC
  Administered 2024-03-13: 650 mg via ORAL
  Filled 2024-03-13: qty 2

## 2024-03-13 MED ORDER — DEXTROSE 5 % IV SOLN
INTRAVENOUS | Status: DC
  Filled 2024-03-13: qty 250

## 2024-03-13 NOTE — Patient Instructions (Signed)
 [facility] - A DEPT OF [facility]  Discharge Instructions: Thank you for choosing [facility] to provide your oncology and hematology care.  If you have a lab appointment with the Cancer Center, please go directly to the Cancer Center and check in at the registration area.  Wear comfortable clothing and clothing appropriate for easy access to any Portacath or PICC line.   We strive to give you quality time with your provider. You may need to reschedule your appointment if you arrive late (15 or more minutes).  Arriving late affects you and other patients whose appointments are after yours.  Also, if you miss three or more appointments without notifying the office, you may be dismissed from the clinic at the providers discretion.      For prescription refill requests, have your pharmacy contact our office and allow 72 hours for refills to be completed.    Today you received the following chemotherapy and/or immunotherapy agents- privigen       To help prevent nausea and vomiting after your treatment, we encourage you to take your nausea medication as directed.  BELOW ARE SYMPTOMS THAT SHOULD BE REPORTED IMMEDIATELY: *FEVER GREATER THAN 100.4 F (38 C) OR HIGHER *CHILLS OR SWEATING *NAUSEA AND VOMITING THAT IS NOT CONTROLLED WITH YOUR NAUSEA MEDICATION *UNUSUAL SHORTNESS OF BREATH *UNUSUAL BRUISING OR BLEEDING *URINARY PROBLEMS (pain or burning when urinating, or frequent urination) *BOWEL PROBLEMS (unusual diarrhea, constipation, pain near the anus) TENDERNESS IN MOUTH AND THROAT WITH OR WITHOUT PRESENCE OF ULCERS (sore throat, sores in mouth, or a toothache) UNUSUAL RASH, SWELLING OR PAIN  UNUSUAL VAGINAL DISCHARGE OR ITCHING   Items with * indicate a potential emergency and should be followed up as soon as possible or go to the Emergency Department if any problems should occur.  Please show the CHEMOTHERAPY ALERT CARD or IMMUNOTHERAPY  ALERT CARD at check-in to the Emergency Department and triage nurse.  Should you have questions after your visit or need to cancel or reschedule your appointment, please contact [facility] - A DEPT OF [facility]  [phone] and follow the prompts.  Office hours are 8:00 a.m. to 4:30 p.m. Monday - Friday. Please note that voicemails left after 4:00 p.m. may not be returned until the following business day.  We are closed weekends and major holidays. You have access to a nurse at all times for urgent questions. Please call the main number to the clinic [phone] and follow the prompts.  For any non-urgent questions, you may also contact your provider using MyChart. We now offer e-Visits for anyone 14 and older to request care online for non-urgent symptoms. For details visit [email or web address].   Also download the MyChart app! Go to the app store, search MyChart, open the app, select [facility], and log in with your MyChart username and password.

## 2024-03-14 ENCOUNTER — Inpatient Hospital Stay

## 2024-03-14 ENCOUNTER — Encounter

## 2024-03-14 ENCOUNTER — Encounter: Payer: Self-pay | Admitting: Internal Medicine

## 2024-03-14 ENCOUNTER — Inpatient Hospital Stay: Admitting: Internal Medicine

## 2024-03-14 VITALS — BP 120/80 | HR 72 | Temp 97.8°F | Resp 16 | Ht 71.0 in | Wt 255.4 lb

## 2024-03-14 DIAGNOSIS — C9002 Multiple myeloma in relapse: Secondary | ICD-10-CM | POA: Diagnosis not present

## 2024-03-14 DIAGNOSIS — Z5112 Encounter for antineoplastic immunotherapy: Secondary | ICD-10-CM | POA: Diagnosis not present

## 2024-03-14 LAB — CBC WITH DIFFERENTIAL (CANCER CENTER ONLY)
Abs Immature Granulocytes: 0.02 K/uL (ref 0.00–0.07)
Basophils Absolute: 0.1 K/uL (ref 0.0–0.1)
Basophils Relative: 1 %
Eosinophils Absolute: 0.1 K/uL (ref 0.0–0.5)
Eosinophils Relative: 2 %
HCT: 41.7 % (ref 39.0–52.0)
Hemoglobin: 13.9 g/dL (ref 13.0–17.0)
Immature Granulocytes: 0 %
Lymphocytes Relative: 10 %
Lymphs Abs: 0.6 K/uL — ABNORMAL LOW (ref 0.7–4.0)
MCH: 29.1 pg (ref 26.0–34.0)
MCHC: 33.3 g/dL (ref 30.0–36.0)
MCV: 87.2 fL (ref 80.0–100.0)
Monocytes Absolute: 1.2 K/uL — ABNORMAL HIGH (ref 0.1–1.0)
Monocytes Relative: 20 %
Neutro Abs: 4 K/uL (ref 1.7–7.7)
Neutrophils Relative %: 67 %
Platelet Count: 150 K/uL (ref 150–400)
RBC: 4.78 MIL/uL (ref 4.22–5.81)
RDW: 15.9 % — ABNORMAL HIGH (ref 11.5–15.5)
WBC Count: 6.1 K/uL (ref 4.0–10.5)
nRBC: 0 % (ref 0.0–0.2)

## 2024-03-14 LAB — CMP (CANCER CENTER ONLY)
ALT: 24 U/L (ref 0–44)
AST: 26 U/L (ref 15–41)
Albumin: 3.8 g/dL (ref 3.5–5.0)
Alkaline Phosphatase: 52 U/L (ref 38–126)
Anion gap: 8 (ref 5–15)
BUN: 10 mg/dL (ref 8–23)
CO2: 27 mmol/L (ref 22–32)
Calcium: 9.2 mg/dL (ref 8.9–10.3)
Chloride: 104 mmol/L (ref 98–111)
Creatinine: 0.89 mg/dL (ref 0.61–1.24)
GFR, Estimated: >60 mL/min (ref >60)
Glucose, Bld: 104 mg/dL — ABNORMAL HIGH (ref 70–99)
Potassium: 4.4 mmol/L (ref 3.5–5.1)
Sodium: 139 mmol/L (ref 135–145)
Total Bilirubin: 0.5 mg/dL (ref 0.0–1.2)
Total Protein: 6.9 g/dL (ref 6.5–8.1)

## 2024-03-14 MED ORDER — MONTELUKAST SODIUM 10 MG PO TABS
10.0000 mg | ORAL_TABLET | Freq: Once | ORAL | Status: DC
  Filled 2024-03-14: qty 1

## 2024-03-14 MED ORDER — ACETAMINOPHEN 325 MG PO TABS
650.0000 mg | ORAL_TABLET | Freq: Once | ORAL | Status: AC
  Administered 2024-03-14: 650 mg via ORAL
  Filled 2024-03-14: qty 2

## 2024-03-14 MED ORDER — DEXAMETHASONE 4 MG PO TABS
20.0000 mg | ORAL_TABLET | Freq: Once | ORAL | Status: AC
  Administered 2024-03-14: 20 mg via ORAL
  Filled 2024-03-14: qty 5

## 2024-03-14 MED ORDER — DIPHENHYDRAMINE HCL 25 MG PO TABS
25.0000 mg | ORAL_TABLET | Freq: Once | ORAL | Status: AC
  Administered 2024-03-14: 25 mg via ORAL
  Filled 2024-03-14: qty 1

## 2024-03-14 MED ORDER — DARATUMUMAB-HYALURONIDASE-FIHJ 1800-30000 MG-UT/15ML ~~LOC~~ SOLN
1800.0000 mg | Freq: Once | SUBCUTANEOUS | Status: AC
  Administered 2024-03-14: 1800 mg via SUBCUTANEOUS
  Filled 2024-03-14: qty 15

## 2024-03-14 MED ORDER — BORTEZOMIB CHEMO SQ INJECTION 3.5 MG (2.5MG/ML)
1.3000 mg/m2 | Freq: Once | INTRAMUSCULAR | Status: AC
  Administered 2024-03-14: 3 mg via SUBCUTANEOUS
  Filled 2024-03-14: qty 1.2

## 2024-03-14 NOTE — Progress Notes (Signed)
 [facility] CONSULT NOTE  Patient Care Team: [name], MD as PCP - General (Internal Medicine) Cindie Ole DASEN, MD as PCP - Electrophysiology (Cardiology) Rennie Cindy SAUNDERS, MD as Consulting Physician (Oncology)  CHIEF COMPLAINTS/PURPOSE OF CONSULTATION: Multiple Myeloma   Oncology History Overview Note  # LIGHT CHAIN MONOCLONAL GAMMOPATHY -DEC 2024-[PCP-SPEP-NEGATIVE; Hb 13; GFR-> 60 ca-WN; However, random UPEP- positive for M protein/Bence-Jones- Protein positive; kappa type.  JAN 2025- SPEP- NEG; K/l= 91 [K=400]. Dx= includes smoldering MM vs active MM. 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)   06/05/2023 Cancer Staging   Staging form: Plasma Cell Myeloma and Plasma Cell Disorders, AJCC 8th Edition - Clinical: Albumin (g/dL): 4, ISS: Stage II, High-risk cytogenetics: Absent, LDH: Normal - Signed by Rennie Cindy SAUNDERS, MD on 06/05/2023 Albumin range (g/dL): Greater than or equal to 3.5 Cytogenetics: t(11;14) translocation   06/06/2023 Cancer Staging   Staging form: Plasma Cell Myeloma and Plasma Cell Disorders, AJCC 8th Edition - Clinical: RISS Stage I (Beta-2 -microglobulin (mg/L): 2.5, Albumin (g/dL): 4, ISS: Stage I, High-risk cytogenetics: Absent, LDH: Normal) - Signed by  Rennie Cindy SAUNDERS, MD on 06/06/2023 Stage prefix: Initial diagnosis Beta 2 microglobulin range (mg/L): Less than 3.5 Albumin range (g/dL): Greater than or equal to 3.5 Cytogenetics: t(11;14) translocation   07/27/2023 -  Chemotherapy   Patient is on Treatment Plan : MYELOMA NEWLY DIAGNOSED TRANSPLANT CANDIDATE DaraVRd (Daratumumab  SQ) with weekly bortezomib  (D1,8,15) q21d x 6 Cycles (Induction/Consolidation)      HISTORY OF PRESENTING ILLNESS: Patient ambulating-independently. Accompanied by wife.   HUDSYN CHAMPINE 68 y.o.  male with A.fib [watchman device; not on anti-coagulation] standard risk- Multiple Myeloma [dx: march 2025] with multiple bone lesions-currently status post induction DARA-RVD-is here to proceed with consolidation Dara-RVD is here for a follow-up.   Discussed the use of AI scribe software for clinical note transcription with the patient, who gave verbal consent to proceed.  History of Present Illness   Jimmy [name] is a 68 year old male with relapsed multiple myeloma who presents for oncology follow-up and coordination of care regarding upcoming prostate and bladder procedures.  He is nearing completion of cycle 9 of chemotherapy for multiple myeloma, receiving daratumumab , bortezomib , and lenalidomide  (20 mg, three weeks on/one week off), with concurrent aspirin. He has tolerated therapy well, without leg edema, cutaneous eruptions, or periorbital edema. He notes mild ocular dryness but denies other new or worsening symptoms.  He is scheduled for prostate biopsy and bladder scraping via cystoscopy on December 22nd, prompted by imaging findings of surface irritation and possible malignancy. Prior MRI and PET/CT demonstrated a prostate anomaly and bladder findings, but no discrete mass. 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 He will receive peri-procedural antibiotics due to infection risk  associated with  the rectal approach. He has been instructed to discontinue aspirin and lenalidomide  five days prior to the procedure and will resume after urology consultation post-procedure.  He is awaiting his lenalidomide  prescription and plans to initiate therapy upon receipt. He completed a bone marrow biopsy in August 2025 and is considering repeat biopsy in February 2026 for MRD testing, preferring local collection with sample sent to [facility]. He discussed transition to maintenance therapy after the current cycle, with planned reduction in lenalidomide  dose and monthly daratumumab  infusions.        Review of Systems  Constitutional:  Negative for chills, diaphoresis, fever and weight loss.  HENT:  Negative for nosebleeds and sore throat.   Eyes:  Negative for double vision.  Respiratory:  Negative for cough, hemoptysis, sputum production, shortness of breath and wheezing.   Cardiovascular:  Negative for chest pain, palpitations, orthopnea and leg swelling.  Gastrointestinal:  Negative for abdominal pain, blood in stool, constipation, diarrhea, heartburn, melena, nausea and vomiting.  Genitourinary:  Negative for dysuria, frequency and urgency.  Musculoskeletal:  Negative for back pain and joint pain.  Skin: Negative.  Negative for itching and rash.  Neurological:  Negative for dizziness, tingling, focal weakness, weakness and headaches.  Endo/Heme/Allergies:  Does not bruise/bleed easily.  Psychiatric/Behavioral:  Negative for depression. The patient is not nervous/anxious and does not have insomnia.     MEDICAL HISTORY:  Past Medical History:  Diagnosis Date   Atrial fibrillation (HCC)    had loop recorder, PAcs after DCCV 09/2019   Atrial flutter (HCC)    Fatigue    HLD (hyperlipidemia)    On amiodarone therapy 09/15/2019   Pain of right lower leg 01/02/2022    SURGICAL HISTORY: Past Surgical History:  Procedure Laterality Date   afib surgical ablation  2021    CARDIOVERSION     2021 for Afib in [place]   CHOLECYSTECTOMY     GALLBLADDER SURGERY  2006   IR BONE MARROW BIOPSY & ASPIRATION  05/18/2023   MINIMALLY INVASIVE MAZE PROCEDURE     done in [place]  Dr R. Gala   stye Right 09/2022    SOCIAL HISTORY: Social History   Socioeconomic History   Marital status: Married    Spouse name: Not on file   Number of children: Not on file   Years of education: Not on file   Highest education level: Bachelor's degree (e.g., BA, AB, BS)  Occupational History   Not on file  Tobacco Use   Smoking status: Never   Smokeless tobacco: Never  Vaping Use   Vaping status: Never Used  Substance and Sexual Activity   Alcohol use: Never   Drug use: Not Currently   Sexual activity: Not Currently  Other Topics Concern   Not on file  Social History Narrative   2 sons and 1 daughter    Married    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    No guns, wears seat belt, safe in relationship   Vegan x 8-9 years as of 07/06/20    Social Drivers of Health   Tobacco Use: Low Risk (03/14/2024)   Patient History    Smoking Tobacco Use: Never    Smokeless Tobacco Use: Never    Passive Exposure: Not on file  Financial Resource Strain: Low Risk (12/28/2023)   Overall Financial Resource Strain (CARDIA)    Difficulty of Paying Living Expenses: Not hard at all  Food Insecurity: No Food Insecurity (12/28/2023)  Epic    Worried About Programme Researcher, Broadcasting/film/video in the Last Year: Never true    [employer] of Food in the Last Year: Never true  Transportation Needs: No Transportation Needs (12/28/2023)   Epic    Lack of Transportation (Medical): No    Lack of Transportation (Non-Medical): No  Physical Activity: Inactive (12/28/2023)   Exercise Vital Sign    Days of Exercise per Week: 0 days    Minutes of Exercise per Session: Not on file  Stress: No Stress Concern Present (12/28/2023)   [name]    Feeling of  Stress: Not at all  Social Connections: Socially Isolated (12/28/2023)   Social Connection and Isolation Panel    Frequency of Communication with Friends and Family: Once a week    Frequency of Social Gatherings with Friends and Family: Never    Attends Religious Services: Never    Database Administrator or Organizations: No    Attends Engineer, Structural: Not on file    Marital Status: Married  Catering Manager Violence: Not At Risk (04/16/2023)   Humiliation, Afraid, Rape, and Kick questionnaire    Fear of Current or Ex-Partner: No    Emotionally Abused: No    Physically Abused: No    Sexually Abused: No  Depression (PHQ2-9): Low Risk (03/14/2024)   Depression (PHQ2-9)    PHQ-2 Score: 4  Alcohol Screen: Low Risk (04/16/2023)   Alcohol Screen    Last Alcohol Screening Score (AUDIT): 0  Housing: Low Risk (12/28/2023)   Epic    Unable to Pay for Housing in the Last Year: No    Number of Times Moved in the Last Year: 0    Homeless in the Last Year: No  Utilities: Not At Risk (12/13/2023)   Received from [facility]   Epic    In the past 12 months has the electric, gas, oil, or water company threatened to shut off services in your home?: No  Health Literacy: Adequate Health Literacy (04/16/2023)   B1300 Health Literacy    Frequency of need for help with medical instructions: Never    FAMILY HISTORY: Family History  Problem Relation Age of Onset   COPD Mother    Atrial fibrillation Mother        ? due to Afib died 54   Cancer Father        lung cancer smoker age 32    Heart disease Father        bypass at 63    Atrial fibrillation Sister     ALLERGIES:  has no known allergies.  MEDICATIONS:  Current Outpatient Medications  Medication Sig Dispense Refill   acetaminophen  (TYLENOL ) 325 MG tablet Take 650 mg by mouth once a week. Take 1 hour prior to infusion appointment.     acyclovir  (ZOVIRAX ) 400 MG tablet Take 1 tablet (400 mg total) by mouth 2  (two) times daily. 180 tablet 1   ASPIRIN 81 PO Take 81 mg by mouth daily.     cyanocobalamin  1000 MCG tablet Take 2,000 mcg by mouth.     dexamethasone  (DECADRON ) 4 MG tablet Take 5 tabs (20mg ) by mouth 1 hour prior to  Dara injection appointment and take 5 tabs (20mg ) the day after weekly infusion appointment. Take with with food . 30 tablet 0   diphenhydrAMINE  (BENADRYL ) 50 MG capsule Take 50 mg by mouth once a week. Take 1 hour prior to  infusion appointment.     ergocalciferol  (VITAMIN D2) 1.25 MG (50000 UT) capsule Take 1 capsule (50,000 Units total) by mouth once a week. 12 capsule 1   hydrOXYzine  (ATARAX ) 10 MG tablet Take 1 tablet (10 mg total) by mouth 3 (three) times daily as needed. 30 tablet 0   lenalidomide  (REVLIMID ) 20 MG capsule Take 1 capsule (20 mg total) by mouth daily. Take for 14 days, then hold for 7 days. Repeat every 21 days. [[facility]] 14 capsule 0   metoprolol  succinate (TOPROL -XL) 50 MG 24 hr tablet Take 1 tablet (50 mg total) by mouth daily. Take with or immediately following a meal. 90 tablet 3   metoprolol  tartrate (LOPRESSOR ) 25 MG tablet Take 1 tablet (25 mg total) by mouth every 4 (four) hours as needed (for sustained heartrate greater than 115 beats per minute).     montelukast  (SINGULAIR ) 10 MG tablet Take daily; and on days of infusion- Take 1 hour prior to infusion appointment. 90 tablet 1   Multiple Vitamin (CALCIUM COMPLEX PO) Take by mouth daily.     Omega-3 1400 MG CAPS      ondansetron  (ZOFRAN ) 8 MG tablet One pill every 8 hours as needed for nausea/vomitting. 40 tablet 1   prochlorperazine  (COMPAZINE ) 10 MG tablet Take 1 tablet (10 mg total) by mouth every 6 (six) hours as needed for nausea or vomiting. 40 tablet 1   Turmeric (CURCUMIN 95) 500 MG CAPS Take 500 mg by mouth 2 (two) times daily.     Vitamin D -Vitamin K (VITAMIN K2-VITAMIN D3 PO) Take by mouth daily.     No current facility-administered medications for this visit.   Facility-Administered  Medications Ordered in Other Visits  Medication Dose Route Frequency Provider Last Rate Last Admin   montelukast  (SINGULAIR ) tablet 10 mg  10 mg Oral Once Deann Mclaine R, MD       PHYSICAL EXAMINATION:   Vitals:   03/14/24 1313  BP: 120/80  Pulse: 72  Resp: 16  Temp: 97.8 F (36.6 C)  SpO2: 97%     Filed Weights   03/14/24 1313  Weight: 255 lb 6.4 oz (115.8 kg)     Positive for bilateral gynecomastia.    Physical Exam Vitals and nursing note reviewed.  HENT:     Head: Normocephalic and atraumatic.     Mouth/Throat:     Pharynx: Oropharynx is clear.  Eyes:     Extraocular Movements: Extraocular movements intact.     Pupils: Pupils are equal, round, and reactive to light.  Cardiovascular:     Rate and Rhythm: Normal rate and regular rhythm.  Abdominal:     Palpations: Abdomen is soft.  Musculoskeletal:        General: Normal range of motion.     Cervical back: Normal range of motion.  Skin:    General: Skin is warm.  Neurological:     General: No focal deficit present.     Mental Status: He is alert and oriented to person, place, and time.  Psychiatric:        Behavior: Behavior normal.        Judgment: Judgment normal.        LABORATORY DATA:  I have reviewed the data as listed Lab Results  Component Value Date   WBC 6.1 03/14/2024   HGB 13.9 03/14/2024   HCT 41.7 03/14/2024   MCV 87.2 03/14/2024   PLT 150 03/14/2024   Recent Labs    02/22/24 1301 03/07/24 1315 03/14/24  1305  NA 136 140 139  K 4.8 4.1 4.4  CL 102 102 104  CO2 27 29 27   GLUCOSE 100* 109* 104*  BUN 16 9 10   CREATININE 0.81 0.87 0.89  CALCIUM 8.2* 9.4 9.2  GFRNONAA >60 >60 >60  PROT 6.3* 6.5 6.9  ALBUMIN 3.1* 3.9 3.8  AST 28 24 26   ALT 46* 22 24  ALKPHOS 45 58 52  BILITOT 0.7 0.6 0.5   MR PROSTATE W WO CONTRAST Result Date: 02/18/2024 EXAM: MRI PROSTATE WITH AND WITHOUT INTRAVENOUS CONTRAST 02/16/2024 10:36:11 AM TECHNIQUE: Multiparametric MRI imaging of the  prostate with and without dynamic contrast enhanced imaging and diffusion weighted imaging was performed. CONTRAST: 10 mL of Vueway . Dynacad/CAD was utilized in analysis of images. COMPARISON: PET CT 05/29/2023. CLINICAL HISTORY: Elevated psa level of 7.02. FINDINGS: PROSTATE: Encapsulated nodularity in the transition zone compatible with benign prostatic hypertrophy. Prostate volume by 3d volumetric analysis: 43.4 cubic centimeters (5.3 x 3.9 x 4.3 cm). No significant abnormal findings of intermediate or higher suspicion for prostate cancer is identified. SEMINAL VESICLES: Unremarkable. NEUROVASCULAR BUNDLE: Unremarkable. LYMPHADENOPATHY: No lymphadenopathy. BLADDER: Mild accentuated mucosal enhancement along the left anterior urinary bladder wall for example on image 149 of series 12. No obvious mass like appearance; possibilities may include sessile tumor or inflammation. BOWEL: The visualized bowel is without acute abnormality. PERITONEAL CAVITY: No free fluid. BONES: Normal bone marrow signal intensity. No suspicious or aggressive osseous lesions. SOFT TISSUES: No focal abnormality. IMPRESSION: 1. No significant abnormal findings of intermediate or higher suspicion for prostate cancer is identified. 2. Mild accentuated mucosal enhancement along the left anterior urinary bladder wall without obvious mass-like appearance, which may represent sessile tumor or inflammation. Electronically signed by: Ryan Salvage MD 02/18/2024 12:20 PM EST RP Workstation: [id number]   CUP PACEART REMOTE DEVICE CHECK Result Date: 02/14/2024 ILR summary report received. Battery status OK. Normal device function. No new symptom, tachy, brady, or pause episodes. 9 new AF episodes, AF burden 99.9%, left atrial appendage closure. Monthly summary reports and ROV/PRN ML, CVRS    Lab Results  Component Value Date   KPAFRELGTCHN 5.5 03/07/2024   KPAFRELGTCHN 5.1 02/08/2024   KPAFRELGTCHN 5.2 01/11/2024   LAMBDASER 3.7 (L)  03/07/2024   LAMBDASER 5.6 (L) 02/08/2024   LAMBDASER 3.9 (L) 01/11/2024   KAPLAMBRATIO 1.49 03/07/2024   KAPLAMBRATIO 0.91 02/08/2024   KAPLAMBRATIO 1.33 01/11/2024     Multiple myeloma in relapse (HCC) # DEC-JAN 2025- STANDARD RISK CYTOGENETICS LIGHT CHAIN MONOCLONAL GAMMOPATHY -DEC 2024-MARCH 2025-PET Numerous lytic myelomatous bone lesions throughout the axial and appendicular skeleton noted on the CT scan without associated discrete significant hypermetabolism.   S/P evaluation with Dr.Choi [facility]- bone marrow evaluation]. TRANSPLANT ELIGIBLE. 4/25- Currently on Dara-RVD [q 21 d cycle- Griffin trial-status post 5 cycles-bone marrow biopsy [[facility]]-November 28, 2023-MRD -LOW POSITIVE [2 cell/million].  Status post stem cell collection patient declines stem cell transplant.  Proceed with consolidation chemotherapy total of 9 cycles. [facility] SEP 2025- PET scan-negative for any acute myelomatous lesions in the bone.  # Proceed with cycle number 9 day 1- Dara-Rev-Dex [ [q 21 d cycle- Griffin trial-; Revlimid  at 20 mg [dose reduced sec  rash].  Continue current therapy-continue close monitoring of myeloma labs.  Starting cycle #10- plan dara- rev maintenance. [10 mg once daily on days 1 to 21] of a 28-day treatment cycle; plan Bone marrow Bx/ in end of Feb 2026- plan clonoseq- if possible locally.  # Secondary hypogammaglobinemia-IVIG infusions every 3- 4 weeks.  No infections noted.  Monitor closely.  # 2025- PET Scan incidental-  Some ill-defined activity above blood pool is seen at the left inferior  aspect of the prostate.  Awaiting prostate MRI Bx/cystoscopy- dec, 22nd-patient will come off aspirin and Revlimid  5 days prior.  # Multiple bone lesion-- [per pt s/p dental clearance] -Continue ca+Vit D BID. [Vit D 1000/d; 5000 q OD]. vit D 28 [June 2025]-recommend 50,000 units vitamin D  once a week; and recommend calcium 1000 mg once a day- Stable. Zometa   # History of a flutter [s/p ablation in [place] -  clip of Left atrial appendage]-no anticoagulation- on asprin; irregular rhythm-2D echo pending.  Stable.  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  # DVT-ID Prophylaxis: asprin/acyclovir -  Stable.  # ACP: had Advance directives.   # # IV access:PIV   # 1-4 cycles-SQ Dara-velcade  weekly x4 cycles; 5-6 dara-q2 w; Lavern; Zometa-??   MD appt- q 3W  Pre-meds-?PS  # DISPOSITION: # q 4 weeks- MM; K/L light chains # chemo today; and as per IS # chemo today-  # as per IS-  Dr.B  All questions were answered. The patient knows to call the clinic with any problems, questions or concerns.    Cindy JONELLE Joe, MD 03/14/2024 4:08 PM

## 2024-03-14 NOTE — Progress Notes (Signed)
 If he needs to take the revlimid  during the holiday time, he needs refill.

## 2024-03-14 NOTE — Assessment & Plan Note (Addendum)
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   S/P evaluation with Dr.Choi [facility]- bone marrow evaluation]. TRANSPLANT ELIGIBLE. 4/25- Currently on Dara-RVD [q 21 d cycle- Griffin trial-status post 5 cycles-bone marrow biopsy [[facility]]-November 28, 2023-MRD -LOW POSITIVE [2 cell/million].  Status post stem cell collection patient declines stem cell transplant.  Proceed with consolidation chemotherapy total of 9 cycles. [facility] SEP 2025- PET scan-negative for any acute myelomatous lesions in the bone.  # Proceed with cycle number 9 day 1- Dara-Rev-Dex [ [q 21 d cycle- Griffin trial-; Revlimid  at 20 mg [dose reduced sec  rash].  Continue current therapy-continue close monitoring of myeloma labs.  Starting cycle #10- plan dara- rev maintenance. [10 mg once daily on days 1 to 21] of a 28-day treatment cycle; plan Bone marrow Bx/ in end of Feb 2026- plan clonoseq- if possible locally.  # Secondary hypogammaglobinemia-IVIG infusions every 3- 4 weeks.  No infections noted.  Monitor closely.  # 2025- PET Scan incidental-  Some ill-defined activity above blood pool is seen at the left inferior  aspect of the prostate.  Awaiting prostate MRI Bx/cystoscopy- dec, 22nd-patient will come off aspirin and Revlimid  5 days prior.  # Multiple bone lesion-- [per pt s/p dental clearance] -Continue ca+Vit D BID. [Vit D 1000/d; 5000 q OD]. vit D 28 [June 2025]-recommend 50,000 units vitamin D  once a week; and recommend calcium 1000 mg once a day- Stable. Zometa   # History of a flutter [s/p ablation in [place] - clip of Left atrial appendage]-no anticoagulation- on asprin; irregular rhythm-2D echo pending.  Stable.  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  # DVT-ID Prophylaxis:  asprin/acyclovir -  Stable.  # ACP: had Advance directives.   # # IV access:PIV   # 1-4 cycles-SQ Dara-velcade  weekly x4 cycles; 5-6 dara-q2 w; Lavern; Zometa-??   MD appt- q 3W  Pre-meds-?PS  # DISPOSITION: # q 4 weeks- MM; K/L light chains # chemo today; and as per IS # chemo today-  # as per IS-  Dr.B

## 2024-03-14 NOTE — Patient Instructions (Signed)
 [facility] - A DEPT OF [facility]  Discharge Instructions: Thank you for choosing [facility] to provide your oncology and hematology care.  If you have a lab appointment with the Cancer Center, please go directly to the Cancer Center and check in at the registration area.  Wear comfortable clothing and clothing appropriate for easy access to any Portacath or PICC line.   We strive to give you quality time with your provider. You may need to reschedule your appointment if you arrive late (15 or more minutes).  Arriving late affects you and other patients whose appointments are after yours.  Also, if you miss three or more appointments without notifying the office, you may be dismissed from the clinic at the provider's discretion.      For prescription refill requests, have your pharmacy contact our office and allow 72 hours for refills to be completed.    Today you received the following chemotherapy and/or immunotherapy agents Velcade and Darzalex      To help prevent nausea and vomiting after your treatment, we encourage you to take your nausea medication as directed.  BELOW ARE SYMPTOMS THAT SHOULD BE REPORTED IMMEDIATELY: *FEVER GREATER THAN 100.4 F (38 C) OR HIGHER *CHILLS OR SWEATING *NAUSEA AND VOMITING THAT IS NOT CONTROLLED WITH YOUR NAUSEA MEDICATION *UNUSUAL SHORTNESS OF BREATH *UNUSUAL BRUISING OR BLEEDING *URINARY PROBLEMS (pain or burning when urinating, or frequent urination) *BOWEL PROBLEMS (unusual diarrhea, constipation, pain near the anus) TENDERNESS IN MOUTH AND THROAT WITH OR WITHOUT PRESENCE OF ULCERS (sore throat, sores in mouth, or a toothache) UNUSUAL RASH, SWELLING OR PAIN  UNUSUAL VAGINAL DISCHARGE OR ITCHING   Items with * indicate a potential emergency and should be followed up as soon as possible or go to the Emergency Department if any problems should occur.  Please show the CHEMOTHERAPY ALERT CARD or  IMMUNOTHERAPY ALERT CARD at check-in to the Emergency Department and triage nurse.  Should you have questions after your visit or need to cancel or reschedule your appointment, please contact [facility] - A DEPT OF [facility]  [phone] and follow the prompts.  Office hours are 8:00 a.m. to 4:30 p.m. Monday - Friday. Please note that voicemails left after 4:00 p.m. may not be returned until the following business day.  We are closed weekends and major holidays. You have access to a nurse at all times for urgent questions. Please call the main number to the clinic [phone] and follow the prompts.  For any non-urgent questions, you may also contact your provider using MyChart. We now offer e-Visits for anyone 72 and older to request care online for non-urgent symptoms. For details visit [email or web address].   Also download the MyChart app! Go to the app store, search "MyChart", open the app, select [facility], and log in with your MyChart username and password.

## 2024-03-15 ENCOUNTER — Encounter

## 2024-03-16 ENCOUNTER — Ambulatory Visit

## 2024-03-16 DIAGNOSIS — I4819 Other persistent atrial fibrillation: Secondary | ICD-10-CM | POA: Diagnosis not present

## 2024-03-17 ENCOUNTER — Encounter

## 2024-03-17 NOTE — Progress Notes (Signed)
 Anesthesia Review:  ERE:Emjdyjwuyp Veludandi  Cardiologist :  Ole Holts ( will be different one at next visit in 05/2024 per pt )  Brandi ollis,NP LOV 02/05/2024   PPM/ ICD: Device Orders: LOOP RECORDER  Device check- 02/14/24  Rep Notified:  Chest x-ray : EKG :  02/05/2024  Echo : 2023  Stress test: Cardiac Cath :   Activity level: can do a flight of stairs without difficutly  Sleep Study/ CPAP : none  Fasting Blood Sugar :      / Checks Blood Sugar -- times a day:    Blood Thinner/ Instructions /Last Dose: ASA / Instructions/ Last Dose :    81 mg aspirin - alst dose on 03/18/2024 per pt    03/14/24- cbc and cmp in epic.     [facility]  Labs of cbc and cmp done prior to infusion in epic.   Labs for surgery to be done day of surgery.  Orders in epic.

## 2024-03-17 NOTE — Patient Instructions (Addendum)
 SURGICAL WAITING ROOM VISITATION  Patients having surgery or a procedure may have no more than 2 support people in the waiting area - these visitors may rotate.    Children ages 74 and under will not be able to visit patients in [facility] under most circumstances.   Visitors with respiratory illnesses are discouraged from visiting and should remain at home.  If the patient needs to stay at the hospital during part of their recovery, the visitor guidelines for inpatient rooms apply. Pre-op nurse will coordinate an appropriate time for 1 support person to accompany patient in pre-op.  This support person may not rotate.    Please refer to the [facility] website for the visitor guidelines for Inpatients (after your surgery is over and you are in a regular room).       Your procedure is scheduled on:  03/24/2024    Report to [facility] Main Entrance    Report to admitting at   115 pm    Call this number if you have problems the morning of surgery [phone]           Fleets enema nite before surgery.   Do not eat food :After Midnight.   After Midnight you may have the following liquids until _ 1200 noon _____  DAY OF SURGERY  Water Non-Citrus Juices (without pulp, NO RED-Apple, White grape, White cranberry) Black Coffee (NO MILK/CREAM OR CREAMERS, sugar ok)  Clear Tea (NO MILK/CREAM OR CREAMERS, sugar ok) regular and decaf                             Plain Jell-O (NO RED)                                           Fruit ices (not with fruit pulp, NO RED)                                     Popsicles (NO RED)                                                               Sports drinks like Gatorade (NO RED)                            If you have questions, please contact your surgeons office.       Oral Hygiene is also important to reduce your risk of infection.                                    Remember - BRUSH YOUR TEETH THE MORNING OF SURGERY WITH  YOUR REGULAR TOOTHPASTE  DENTURES WILL BE REMOVED PRIOR TO SURGERY PLEASE DO NOT APPLY Poly grip OR ADHESIVES!!!   Do NOT smoke after Midnight   Stop all vitamins and herbal supplements 7 days before surgery.   Take these medicines the morning of surgery with A SIP OF WATER:  toprol    DO NOT TAKE ANY ORAL DIABETIC MEDICATIONS DAY OF YOUR SURGERY  Bring CPAP mask and tubing day of surgery.                              You may not have any metal on your body including hair pins, jewelry, and body piercing             Do not wear make-up, lotions, powders, perfumes/cologne, or deodorant  Do not wear nail polish including gel and S&S, artificial/acrylic nails, or any other type of covering on natural nails including finger and toenails. If you have artificial nails, gel coating, etc. that needs to be removed by a nail salon please have this removed prior to surgery or surgery may need to be canceled/ delayed if the surgeon/ anesthesia feels like they are unable to be safely monitored.   Do not shave  48 hours prior to surgery.               Men may shave face and neck.   Do not bring valuables to the hospital. [facility] IS NOT             RESPONSIBLE   FOR VALUABLES.   Contacts, glasses, dentures or bridgework may not be worn into surgery.   Bring small overnight bag day of surgery.   DO NOT BRING YOUR HOME MEDICATIONS TO THE HOSPITAL. PHARMACY WILL DISPENSE MEDICATIONS LISTED ON YOUR MEDICATION LIST TO YOU DURING YOUR ADMISSION IN THE HOSPITAL!    Patients discharged on the day of surgery will not be allowed to drive home.  Someone NEEDS to stay with you for the first 24 hours after anesthesia.   Special Instructions: Bring a copy of your healthcare power of attorney and living will documents the day of surgery if you haven't scanned them before.              Please read over the following fact sheets you were given: IF YOU HAVE QUESTIONS ABOUT YOUR PRE-OP INSTRUCTIONS PLEASE  CALL [phone].   If you received a COVID test during your pre-op visit  it is requested that you wear a mask when out in public, stay away from anyone that may not be feeling well and notify your surgeon if you develop symptoms. If you test positive for Covid or have been in contact with anyone that has tested positive in the last 10 days please notify you surgeon.    [facility] - Preparing for Surgery Before surgery, you can play an important role.  Because skin is not sterile, your skin needs to be as free of germs as possible.  You can reduce the number of germs on your skin by washing with CHG (chlorahexidine gluconate) soap before surgery.  CHG is an antiseptic cleaner which kills germs and bonds with the skin to continue killing germs even after washing. Please DO NOT use if you have an allergy to CHG or antibacterial soaps.  If your skin becomes reddened/irritated stop using the CHG and inform your nurse when you arrive at Short Stay. Do not shave (including legs and underarms) for at least 48 hours prior to the first CHG shower.  You may shave your face/neck. Please follow these instructions carefully:  1.  Shower with CHG Soap the night before surgery and the  morning of Surgery.  2.  If you choose to wash your hair, wash your hair  first as usual with your  normal  shampoo.  3.  After you shampoo, rinse your hair and body thoroughly to remove the  shampoo.                           4.  Use CHG as you would any other liquid soap.  You can apply chg directly  to the skin and wash                       Gently with a scrungie or clean washcloth.  5.  Apply the CHG Soap to your body ONLY FROM THE NECK DOWN.   Do not use on face/ open                           Wound or open sores. Avoid contact with eyes, ears mouth and genitals (private parts).                       Wash face,  Genitals (private parts) with your normal soap.             6.  Wash thoroughly, paying special attention to the area  where your surgery  will be performed.  7.  Thoroughly rinse your body with warm water from the neck down.  8.  DO NOT shower/wash with your normal soap after using and rinsing off  the CHG Soap.                9.  Pat yourself dry with a clean towel.            10.  Wear clean pajamas.            11.  Place clean sheets on your bed the night of your first shower and do not  sleep with pets. Day of Surgery : Do not apply any lotions/deodorants the morning of surgery.  Please wear clean clothes to the hospital/surgery center.  FAILURE TO FOLLOW THESE INSTRUCTIONS MAY RESULT IN THE CANCELLATION OF YOUR SURGERY PATIENT SIGNATURE_________________________________  NURSE SIGNATURE__________________________________  ________________________________________________________________________

## 2024-03-18 ENCOUNTER — Encounter (HOSPITAL_COMMUNITY)
Admission: RE | Admit: 2024-03-18 | Discharge: 2024-03-18 | Disposition: A | Source: Ambulatory Visit | Attending: Urology

## 2024-03-18 ENCOUNTER — Encounter (HOSPITAL_COMMUNITY): Payer: Self-pay

## 2024-03-18 ENCOUNTER — Other Ambulatory Visit: Payer: Self-pay

## 2024-03-18 VITALS — BP 128/81 | HR 77 | Temp 98.5°F | Resp 16 | Ht 71.0 in | Wt 255.0 lb

## 2024-03-18 DIAGNOSIS — C9000 Multiple myeloma not having achieved remission: Secondary | ICD-10-CM | POA: Diagnosis not present

## 2024-03-18 DIAGNOSIS — R972 Elevated prostate specific antigen [PSA]: Secondary | ICD-10-CM | POA: Insufficient documentation

## 2024-03-18 DIAGNOSIS — Z01818 Encounter for other preprocedural examination: Secondary | ICD-10-CM

## 2024-03-18 DIAGNOSIS — I4891 Unspecified atrial fibrillation: Secondary | ICD-10-CM | POA: Diagnosis not present

## 2024-03-18 DIAGNOSIS — C673 Malignant neoplasm of anterior wall of bladder: Secondary | ICD-10-CM | POA: Diagnosis not present

## 2024-03-18 HISTORY — DX: Malignant (primary) neoplasm, unspecified: C80.1

## 2024-03-18 HISTORY — DX: Other complications of anesthesia, initial encounter: T88.59XA

## 2024-03-18 LAB — CUP PACEART REMOTE DEVICE CHECK
Date Time Interrogation Session: 20251213231227
Implantable Pulse Generator Implant Date: 20210518

## 2024-03-19 ENCOUNTER — Encounter (HOSPITAL_COMMUNITY): Payer: Self-pay

## 2024-03-19 NOTE — Anesthesia Preprocedure Evaluation (Signed)
 Anesthesia Evaluation    Airway        Dental   Pulmonary           Cardiovascular      Neuro/Psych    GI/Hepatic   Endo/Other    Renal/GU      Musculoskeletal   Abdominal   Peds  Hematology   Anesthesia Other Findings   Reproductive/Obstetrics                              Anesthesia Physical Anesthesia Plan  ASA:   Anesthesia Plan:    Post-op Pain Management:    Induction:   PONV Risk Score and Plan:   Airway Management Planned:   Additional Equipment:   Intra-op Plan:   Post-operative Plan:   Informed Consent:   Plan Discussed with:   Anesthesia Plan Comments: (See PAT note from 12/16)         Anesthesia Quick Evaluation

## 2024-03-19 NOTE — Progress Notes (Signed)
 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  Seen by [name], NP in EP clinic on 11/4.  A-fib RVR recurred in September 2025 and he was admitted to [facility]. Not on OAC with LAA clipping hx. ILR shows he has persistent A-fib with 99.9% burden.  He is rate controlled with metoprolol .  Advised to follow-up in 4 months.  Patient follows with Oncology and was last seen on 12/12.  He is currently undergoing chemo - last dose was on 12/12. He also has low immune function and gets IVIG infusions every 3-4 weeks. He has been instructed to discontinue aspirin and lenalidomide  five days prior to the procedure and will resume after urology consultation post-procedure.  VS: BP 128/81   Pulse 77   Temp 36.9 C (Oral)   Resp 16   Ht 5' 11 (1.803 m)   Wt 115.7 kg   SpO2 98%   BMI 35.57 kg/m   PROVIDERS: [name], MD   LABS: Labs reviewed: Acceptable for surgery. (all labs ordered are listed, but only abnormal results are displayed)  Labs Reviewed - No data to display    EKG 02/05/24:  Atrial fibrillation, rate 93   Loop recorder device  check 03/15/2024:  ILR summary report received. Battery status OK. Normal device function. No new symptom, tachy, brady, or pause episodes. Known AF, controlled rates, left atrial appendage clip, ASA, per PA.  Monthly summary reports and ROV/PRN  Echo 10/11/2023:  IMPRESSIONS    1. Left ventricular ejection fraction, by estimation, is 55 to 60%. The left ventricle has normal function. The left ventricle has no regional wall motion abnormalities. There is mild left ventricular hypertrophy. Left ventricular diastolic parameters are indeterminate.  2. Right ventricular systolic function is low normal. The right ventricular size is normal.  3. The mitral valve is normal in structure. Trivial mitral valve regurgitation.  4. The aortic valve is tricuspid. Aortic valve regurgitation is not visualized. No aortic stenosis is present.    Past Medical History:  Diagnosis Date   Atrial fibrillation (HCC)    had loop recorder, PAcs after DCCV 09/2019   Atrial flutter (HCC)    Cancer (HCC)    multiple myeloma   Complication of anesthesia    slow to wake up after gallbladder surgery in 2006 no problems since   Dysrhythmia    afib   Fatigue    HLD (hyperlipidemia)    On amiodarone therapy 09/15/2019   Pain of right lower leg 01/02/2022    Past Surgical History:  Procedure Laterality Date   afib surgical ablation  2021   CARDIOVERSION     2021 for Afib in [place]   CHOLECYSTECTOMY     GALLBLADDER SURGERY  2006   IR BONE MARROW BIOPSY & ASPIRATION  05/18/2023   MINIMALLY INVASIVE MAZE PROCEDURE     done in [place]  Dr R. Gala   stye Right 09/2022    MEDICATIONS:  acetaminophen  (TYLENOL ) 325 MG tablet   acyclovir  (ZOVIRAX ) 400 MG tablet   ASPIRIN 81 PO   cyanocobalamin  1000 MCG tablet   dexamethasone  (DECADRON ) 4 MG tablet   diphenhydrAMINE  (BENADRYL ) 50 MG capsule   ergocalciferol  (VITAMIN D2) 1.25 MG (50000 UT) capsule   hydrOXYzine  (ATARAX ) 10 MG tablet   lenalidomide  (REVLIMID )  20 MG capsule   metoprolol  succinate (TOPROL -XL) 50 MG 24 hr tablet   metoprolol  tartrate (LOPRESSOR ) 25 MG tablet   montelukast  (SINGULAIR ) 10 MG tablet   Multiple Vitamin (CALCIUM COMPLEX PO)   Omega-3 1400 MG CAPS   ondansetron  (ZOFRAN ) 8 MG tablet   prochlorperazine  (COMPAZINE ) 10 MG tablet   Turmeric (CURCUMIN 95) 500 MG CAPS   Vitamin D -Vitamin K (VITAMIN K2-VITAMIN D3 PO)   No current facility-administered medications for this encounter.    [name] Short Stay/Anesthesiology [facility] Phone [phone] 03/19/2024 9:51 AM

## 2024-03-21 NOTE — Progress Notes (Signed)
 Remote Loop Recorder Transmission

## 2024-03-24 ENCOUNTER — Ambulatory Visit (HOSPITAL_COMMUNITY)
Admission: RE | Admit: 2024-03-24 | Discharge: 2024-03-24 | Disposition: A | Discharge status: Home / Self Care | Source: Ambulatory Visit | Attending: Urology | Admitting: Urology

## 2024-03-24 ENCOUNTER — Encounter (HOSPITAL_COMMUNITY): Payer: Self-pay | Admitting: Medical

## 2024-03-24 ENCOUNTER — Other Ambulatory Visit: Payer: Self-pay

## 2024-03-24 ENCOUNTER — Encounter: Admission: RE | Disposition: A | Payer: Self-pay | Attending: Urology

## 2024-03-24 ENCOUNTER — Encounter (HOSPITAL_COMMUNITY): Payer: Self-pay | Admitting: Urology

## 2024-03-24 ENCOUNTER — Ambulatory Visit (HOSPITAL_COMMUNITY): Admitting: Anesthesiology

## 2024-03-24 ENCOUNTER — Ambulatory Visit (HOSPITAL_COMMUNITY)

## 2024-03-24 DIAGNOSIS — I4891 Unspecified atrial fibrillation: Secondary | ICD-10-CM | POA: Insufficient documentation

## 2024-03-24 DIAGNOSIS — C9000 Multiple myeloma not having achieved remission: Secondary | ICD-10-CM | POA: Insufficient documentation

## 2024-03-24 DIAGNOSIS — Z8551 Personal history of malignant neoplasm of bladder: Secondary | ICD-10-CM | POA: Insufficient documentation

## 2024-03-24 DIAGNOSIS — N3090 Cystitis, unspecified without hematuria: Secondary | ICD-10-CM | POA: Insufficient documentation

## 2024-03-24 DIAGNOSIS — R972 Elevated prostate specific antigen [PSA]: Secondary | ICD-10-CM | POA: Insufficient documentation

## 2024-03-24 DIAGNOSIS — D494 Neoplasm of unspecified behavior of bladder: Secondary | ICD-10-CM | POA: Insufficient documentation

## 2024-03-24 DIAGNOSIS — Z01818 Encounter for other preprocedural examination: Secondary | ICD-10-CM

## 2024-03-24 HISTORY — PX: TRANSRECTAL ULTRASOUND: SHX5146

## 2024-03-24 HISTORY — PX: PROSTATE BIOPSY: SHX241

## 2024-03-24 HISTORY — PX: TRANSURETHRAL RESECTION OF BLADDER TUMOR: SHX2575

## 2024-03-24 HISTORY — PX: CYSTOSCOPY W/ RETROGRADES: SHX1426

## 2024-03-24 LAB — CBC
HCT: 40.9 % (ref 39.0–52.0)
Hemoglobin: 14 g/dL (ref 13.0–17.0)
MCH: 29.5 pg (ref 26.0–34.0)
MCHC: 34.2 g/dL (ref 30.0–36.0)
MCV: 86.3 fL (ref 80.0–100.0)
Platelets: 155 K/uL (ref 150–400)
RBC: 4.74 MIL/uL (ref 4.22–5.81)
RDW: 16.4 % — ABNORMAL HIGH (ref 11.5–15.5)
WBC: 6.6 K/uL (ref 4.0–10.5)
nRBC: 0 % (ref 0.0–0.2)

## 2024-03-24 LAB — BASIC METABOLIC PANEL WITH GFR
Anion gap: 10 (ref 5–15)
BUN: 10 mg/dL (ref 8–23)
CO2: 25 mmol/L (ref 22–32)
Calcium: 9.3 mg/dL (ref 8.9–10.3)
Chloride: 104 mmol/L (ref 98–111)
Creatinine, Ser: 0.75 mg/dL (ref 0.61–1.24)
GFR, Estimated: >60 mL/min
Glucose, Bld: 106 mg/dL — ABNORMAL HIGH (ref 70–99)
Potassium: 4.4 mmol/L (ref 3.5–5.1)
Sodium: 139 mmol/L (ref 135–145)

## 2024-03-24 SURGERY — CYSTOSCOPY, WITH RETROGRADE PYELOGRAM
Anesthesia: General | Site: Rectum

## 2024-03-24 MED ORDER — LIDOCAINE HCL (PF) 2 % IJ SOLN
INTRAMUSCULAR | Status: AC
  Filled 2024-03-24: qty 5

## 2024-03-24 MED ORDER — FENTANYL CITRATE (PF) 50 MCG/ML IJ SOSY
PREFILLED_SYRINGE | INTRAMUSCULAR | Status: AC
  Filled 2024-03-24: qty 1

## 2024-03-24 MED ORDER — ONDANSETRON HCL 4 MG/2ML IJ SOLN
INTRAMUSCULAR | Status: AC
  Filled 2024-03-24: qty 2

## 2024-03-24 MED ORDER — SUCCINYLCHOLINE CHLORIDE 200 MG/10ML IV SOSY
PREFILLED_SYRINGE | INTRAVENOUS | Status: DC | PRN
  Administered 2024-03-24: 140 mg via INTRAVENOUS

## 2024-03-24 MED ORDER — PHENYLEPHRINE 80 MCG/ML (10ML) SYRINGE FOR IV PUSH (FOR BLOOD PRESSURE SUPPORT)
PREFILLED_SYRINGE | INTRAVENOUS | Status: DC | PRN
  Administered 2024-03-24: 160 ug via INTRAVENOUS
  Administered 2024-03-24: 120 ug via INTRAVENOUS

## 2024-03-24 MED ORDER — PROPOFOL 10 MG/ML IV BOLUS
INTRAVENOUS | Status: DC | PRN
  Administered 2024-03-24: 180 mg via INTRAVENOUS

## 2024-03-24 MED ORDER — MIDAZOLAM HCL 2 MG/2ML IJ SOLN
INTRAMUSCULAR | Status: AC
  Filled 2024-03-24: qty 2

## 2024-03-24 MED ORDER — KETOROLAC TROMETHAMINE 30 MG/ML IJ SOLN
INTRAMUSCULAR | Status: AC
  Filled 2024-03-24: qty 1

## 2024-03-24 MED ORDER — PHENYLEPHRINE 80 MCG/ML (10ML) SYRINGE FOR IV PUSH (FOR BLOOD PRESSURE SUPPORT)
PREFILLED_SYRINGE | INTRAVENOUS | Status: AC
  Filled 2024-03-24: qty 10

## 2024-03-24 MED ORDER — ORAL CARE MOUTH RINSE: 15.0000 mL | Freq: Once | OROMUCOSAL | Status: AC

## 2024-03-24 MED ORDER — ONDANSETRON HCL 4 MG/2ML IJ SOLN
INTRAMUSCULAR | Status: DC | PRN
  Administered 2024-03-24: 4 mg via INTRAVENOUS

## 2024-03-24 MED ORDER — SUCCINYLCHOLINE CHLORIDE 200 MG/10ML IV SOSY
PREFILLED_SYRINGE | INTRAVENOUS | Status: AC
  Filled 2024-03-24: qty 10

## 2024-03-24 MED ORDER — SUGAMMADEX SODIUM 200 MG/2ML IV SOLN
INTRAVENOUS | Status: AC
  Filled 2024-03-24: qty 2

## 2024-03-24 MED ORDER — FENTANYL CITRATE (PF) 100 MCG/2ML IJ SOLN
INTRAMUSCULAR | Status: AC
  Filled 2024-03-24: qty 2

## 2024-03-24 MED ORDER — MIDAZOLAM HCL 5 MG/5ML IJ SOLN
INTRAMUSCULAR | Status: DC | PRN
  Administered 2024-03-24: 2 mg via INTRAVENOUS

## 2024-03-24 MED ORDER — HYDROCODONE-ACETAMINOPHEN 5-325 MG PO TABS: 1.0000 | ORAL_TABLET | Freq: Four times a day (QID) | ORAL | 0 refills | Status: DC | PRN

## 2024-03-24 MED ORDER — FLEET ENEMA RE ENEM: 1.0000 | ENEMA | Freq: Once | RECTAL | Status: DC

## 2024-03-24 MED ORDER — CHLORHEXIDINE GLUCONATE 0.12 % MT SOLN
15.0000 mL | Freq: Once | OROMUCOSAL | Status: AC
  Administered 2024-03-24: 15 mL via OROMUCOSAL

## 2024-03-24 MED ORDER — LIDOCAINE HCL (CARDIAC) PF 100 MG/5ML IV SOSY
PREFILLED_SYRINGE | INTRAVENOUS | Status: DC | PRN
  Administered 2024-03-24: 100 mg via INTRAVENOUS

## 2024-03-24 MED ORDER — OXYCODONE HCL 5 MG/5ML PO SOLN: 5.0000 mg | Freq: Once | ORAL | Status: DC | PRN

## 2024-03-24 MED ORDER — FENTANYL CITRATE (PF) 50 MCG/ML IJ SOSY
25.0000 ug | PREFILLED_SYRINGE | INTRAMUSCULAR | Status: DC | PRN
  Administered 2024-03-24: 50 ug via INTRAVENOUS

## 2024-03-24 MED ORDER — FENTANYL CITRATE (PF) 100 MCG/2ML IJ SOLN
INTRAMUSCULAR | Status: DC | PRN
  Administered 2024-03-24: 100 ug via INTRAVENOUS

## 2024-03-24 MED ORDER — IOHEXOL 300 MG/ML  SOLN
INTRAMUSCULAR | Status: DC | PRN
  Administered 2024-03-24: 10 mL

## 2024-03-24 MED ORDER — OXYCODONE HCL 5 MG PO TABS: 5.0000 mg | ORAL_TABLET | Freq: Once | ORAL | Status: DC | PRN

## 2024-03-24 MED ORDER — KETOROLAC TROMETHAMINE 30 MG/ML IJ SOLN
INTRAMUSCULAR | Status: DC | PRN
  Administered 2024-03-24: 30 mg via INTRAVENOUS

## 2024-03-24 MED ORDER — ROCURONIUM BROMIDE 10 MG/ML (PF) SYRINGE
PREFILLED_SYRINGE | INTRAVENOUS | Status: AC
  Filled 2024-03-24: qty 10

## 2024-03-24 MED ORDER — PROPOFOL 10 MG/ML IV BOLUS
INTRAVENOUS | Status: AC
  Filled 2024-03-24: qty 20

## 2024-03-24 MED ORDER — SUGAMMADEX SODIUM 200 MG/2ML IV SOLN
INTRAVENOUS | Status: DC | PRN
  Administered 2024-03-24: 200 mg via INTRAVENOUS

## 2024-03-24 MED ORDER — AMOXICILLIN-POT CLAVULANATE 875-125 MG PO TABS: 1.0000 | ORAL_TABLET | Freq: Two times a day (BID) | ORAL | 0 refills | Status: DC

## 2024-03-24 MED ORDER — LACTATED RINGERS IV SOLN: INTRAVENOUS | Status: DC

## 2024-03-24 MED ORDER — ACETAMINOPHEN 500 MG PO TABS
1000.0000 mg | ORAL_TABLET | Freq: Once | ORAL | Status: AC
  Administered 2024-03-24: 1000 mg via ORAL
  Filled 2024-03-24: qty 2

## 2024-03-24 MED ORDER — EPHEDRINE 5 MG/ML INJ
INTRAVENOUS | Status: AC
  Filled 2024-03-24: qty 5

## 2024-03-24 MED ORDER — DROPERIDOL 2.5 MG/ML IJ SOLN: 0.6250 mg | Freq: Once | INTRAMUSCULAR | Status: DC | PRN

## 2024-03-24 MED ORDER — SODIUM CHLORIDE 0.9 % IV SOLN
2.0000 g | INTRAVENOUS | Status: AC
  Administered 2024-03-24: 2 g via INTRAVENOUS
  Filled 2024-03-24: qty 20

## 2024-03-24 MED ORDER — ROCURONIUM BROMIDE 100 MG/10ML IV SOLN
INTRAVENOUS | Status: DC | PRN
  Administered 2024-03-24: 50 mg via INTRAVENOUS
  Administered 2024-03-24: 20 mg via INTRAVENOUS
  Administered 2024-03-24: 10 mg via INTRAVENOUS

## 2024-03-24 MED ORDER — SODIUM CHLORIDE 0.9 % IR SOLN
Status: DC | PRN
  Administered 2024-03-24: 3000 mL
  Administered 2024-03-24: 6000 mL

## 2024-03-24 SURGICAL SUPPLY — 28 items
BAG COUNTER SPONGE SURGICOUNT (BAG) IMPLANT
BAG URINE DRAIN 2000ML AR STRL (UROLOGICAL SUPPLIES) IMPLANT
BAG URO CATCHER STRL LF (MISCELLANEOUS) ×4 IMPLANT
CATH FOLEY 2WAY SLVR 5CC 18FR (CATHETERS) IMPLANT
CATH FOLEY 2WAY SLVR 5CC 20FR (CATHETERS) IMPLANT
CATH URETL OPEN END 6FR 70 (CATHETERS) ×4 IMPLANT
CLOTH BEACON ORANGE TIMEOUT ST (SAFETY) ×4 IMPLANT
COVER SURGICAL LIGHT HANDLE (MISCELLANEOUS) ×4 IMPLANT
DRAPE FOOT SWITCH (DRAPES) ×4 IMPLANT
ELECT REM PT RETURN 15FT ADLT (MISCELLANEOUS) IMPLANT
GLOVE BIO SURGEON STRL SZ7.5 (GLOVE) ×4 IMPLANT
GOWN STRL REUS W/ TWL XL LVL3 (GOWN DISPOSABLE) ×4 IMPLANT
GUIDEWIRE STR DUAL SENSOR (WIRE) IMPLANT
INST BIOPSY MAXCORE 18GX25 (NEEDLE) IMPLANT
INSTR BIOPSY MAXCORE 18GX20 (NEEDLE) IMPLANT
KIT TURNOVER KIT A (KITS) ×4 IMPLANT
LOOP CUT BIPOLAR 24F LRG (ELECTROSURGICAL) IMPLANT
MANIFOLD NEPTUNE II (INSTRUMENTS) ×4 IMPLANT
NS IRRIG 1000ML POUR BTL (IV SOLUTION) IMPLANT
PACK CYSTO (CUSTOM PROCEDURE TRAY) ×4 IMPLANT
PAD TELFA 2X3 NADH STRL (GAUZE/BANDAGES/DRESSINGS) IMPLANT
PENCIL SMOKE EVACUATOR (MISCELLANEOUS) IMPLANT
PLUG CATH AND CAP STRL 200 (CATHETERS) IMPLANT
SYR CONTROL 10ML LL (SYRINGE) IMPLANT
SYRINGE TOOMEY IRRIG 70ML (MISCELLANEOUS) IMPLANT
TUBING CONNECTING 10 (TUBING) ×4 IMPLANT
TUBING UROLOGY SET (TUBING) IMPLANT
UNDERPAD 30X36 HEAVY ABSORB (UNDERPADS AND DIAPERS) ×4 IMPLANT

## 2024-03-24 NOTE — Interval H&P Note (Signed)
 History and Physical Interval Note:  03/24/2024 4:30 PM  Jimmy [name]  has presented today for surgery, with the diagnosis of BLADDER CANCER AND ELEVATED PSA.  The various methods of treatment have been discussed with the patient and family. After consideration of risks, benefits and other options for treatment, the patient has consented to  Procedures with comments: TURBT, WITH CHEMOTHERAPEUTIC AGENT INSTILLATION INTO BLADDER (N/A) - INSTILL GEMCITABINE INTRAVESICALLY INSTILLATION, BLADDER (N/A) - INSTILL GEMCITABINE INTRAVESICALLY CYSTOSCOPY, WITH RETROGRADE PYELOGRAM (Bilateral) BIOPSY, PROSTATE, RECTAL APPROACH, WITH US  GUIDANCE (N/A) ULTRASOUND, RECTAL APPROACH (N/A) as a surgical intervention.  The patient's history has been reviewed, patient examined, no change in status, stable for surgery.  I have reviewed the patient's chart and labs.  Questions were answered to the patient's satisfaction.     Sherwood JONETTA Edison, III

## 2024-03-24 NOTE — Anesthesia Postprocedure Evaluation (Signed)
"   Anesthesia Post Note  Patient: Jimmy [name]  Procedure(s) Performed: CYSTOSCOPY, WITH RETROGRADE PYELOGRAM (Bilateral: Bladder) BIOPSY, PROSTATE, RECTAL APPROACH, WITH US  GUIDANCE (Rectum) ULTRASOUND, RECTAL APPROACH (Rectum) TURBT (TRANSURETHRAL RESECTION OF BLADDER TUMOR)     Patient location during evaluation: PACU Anesthesia Type: General Level of consciousness: awake and alert Pain management: pain level controlled Vital Signs Assessment: post-procedure vital signs reviewed and stable Respiratory status: spontaneous breathing, nonlabored ventilation, respiratory function stable and patient connected to nasal cannula oxygen Cardiovascular status: blood pressure returned to baseline and stable Postop Assessment: no apparent nausea or vomiting Anesthetic complications: no   No notable events documented.  Last Vitals:  Vitals:   03/24/24 1836 03/24/24 1852  BP: (!) 140/97 (!) 156/113  Pulse: 90 85  Resp: 14   Temp:    SpO2: 96% 93%    Last Pain:  Vitals:   03/24/24 1852  TempSrc:   PainSc: 0-No pain                 [name]      "

## 2024-03-24 NOTE — Discharge Instructions (Addendum)
 Transurethral Resection of Bladder Tumor (TURBT) or Bladder Biopsy   Definition:  Transurethral Resection of the Bladder Tumor is a surgical procedure used to diagnose and remove tumors within the bladder. TURBT is the most common treatment for early stage bladder cancer.  General instructions:     Your recent bladder surgery requires very little post hospital care but some definite precautions.  Despite the fact that no skin incisions were used, the area around the bladder incisions are raw and covered with scabs to promote healing and prevent bleeding. Certain precautions are needed to insure that the scabs are not disturbed over the next 2-4 weeks while the healing proceeds.  Because the raw surface inside your bladder and the irritating effects of urine you may expect frequency of urination and/or urgency (a stronger desire to urinate) and perhaps even getting up at night more often. This will usually resolve or improve slowly over the healing period. You may see some blood in your urine over the first 6 weeks. Do not be alarmed, even if the urine was clear for a while. Get off your feet and drink lots of fluids until clearing occurs. If you start to pass clots or don't improve call us .  You may remove your urinary catheter on Monday, December 29th, 2025.   Diet:  You may return to your normal diet immediately. Because of the raw surface of your bladder, alcohol, spicy foods, foods high in acid and drinks with caffeine may cause irritation or frequency and should be used in moderation. To keep your urine flowing freely and avoid constipation, drink plenty of fluids during the day (8-10 glasses). Tip: Avoid cranberry juice because it is very acidic.  Activity:  Your physical activity doesn't need to be restricted. However, if you are very active, you may see some blood in the urine. We suggest that you reduce your activity under the circumstances until the bleeding has  stopped.  Bowels:  It is important to keep your bowels regular during the postoperative period. Straining with bowel movements can cause bleeding. A bowel movement every other day is reasonable. Use a mild laxative if needed, such as milk of magnesia 2-3 tablespoons, or 2 Dulcolax tablets. Call if you continue to have problems. If you had been taking narcotics for pain, before, during or after your surgery, you may be constipated. Take a laxative if necessary.    Medication:  You should resume your pre-surgery medications unless told not to. In addition you may be given an antibiotic to prevent or treat infection. Antibiotics are not always necessary. All medication should be taken as prescribed until the bottles are finished unless you are having an unusual reaction to one of the drugs.

## 2024-03-24 NOTE — Op Note (Addendum)
 Operative Note  Preoperative diagnosis:  1.  Elevated PSA 2.  Bladder tumor  Postoperative diagnosis: 1.  Elevated PSA 2.  Bladder tumor greater than 5 cm  Procedure(s): 1.  Transrectal ultrasound-guided prostate biopsy 2.  Cystoscopy with bilateral retrograde pyelogram 3.  Transurethral resection bladder tumor--large  Surgeon: Sherwood Edison, MD  Assistants: None  Anesthesia: General  Complications: None immediate  EBL: Minimal  Specimens: 1.  Bladder tumor 2.  12 needle core prostate biopsies  Drains/Catheters: 1.  20 French Foley catheter  Intraoperative findings: 1.  Normal anterior urethra 2.  Borderline obstructive prostate 3.  Bladder mucosa with subtle irregularity measuring about 5 to 6 cm.  Bladder mucosa was slightly raised and edematous.  Several areas were biopsied and the entire area was fulgurated.  All abnormal area fulgurated.  One of the biopsy areas was a little bit deep but without obvious perforation but I opted to leave a Foley catheter. 4.  Right retrograde pyelogram without any filling defect or hydronephrosis 5.  Left retrograde pyelogram without any filling defect or hydronephrosis  6.  Prostate 35 cc  Indication: 68 year old male with a history of elevated PSA and abnormality seen on PET scan in his prostate presents for prostate biopsy.  He was also found to have possible bladder malignancy on cystoscopy and presents for biopsy and fulguration/TURBT.  Description of procedure:  The patient was identified and consent was obtained.  The patient was taken to the operating room and placed in the supine position.  The patient was placed under general anesthesia.  Perioperative antibiotics were administered.  The patient was placed in left lateral decubitus.  Patient was prepped and draped in a standard sterile fashion and a timeout was performed.  Ultrasound was advanced into the rectum.  Ultrasound was performed.  Standard 12 core biopsy template was  performed.  Ultrasound probe was removed and no active bleeding from the rectum was noted.  Patient was then repositioned into dorsolithotomy.  A 21 French rigid cystoscope was advanced into the urethra and into the bladder.  Complete cystoscopy was performed with findings noted above.  Open-ended ureteral catheter was advanced into the left ureteral orifice and a retrograde pyelogram was performed with no abnormal findings.  Same was performed on the right again with no abnormal findings.  I withdrew the scope and exchanged for the 26 French resectoscope with the visual obturator in place which was advanced into the bladder.  I exchanged for the bipolar working element.  I proceeded to take several biopsies of the abnormal area followed by fulguration of the entire area.  Total area spanned 5 to 6 cm.  Once the entire area was treated, I inspected bladder mucosa.  There was 1 area of resection that was a little bit deep but with no obvious perforation.  With the bladder completely full there was no extravasation of fluid.  I collected the specimen.  Given the area of resection and the 1 area that was a little bit deeper I elected to leave a Foley catheter for bladder rest and healing.  This concluded the operation.  Patient tolerated the procedure well was stable postoperatively.  Plan: Follow-up in 1 week for pathology review and voiding trial

## 2024-03-24 NOTE — Transfer of Care (Signed)
 Immediate Anesthesia Transfer of Care Note  Patient: Jimmy [name]  Procedure(s) Performed: CYSTOSCOPY, WITH RETROGRADE PYELOGRAM (Bilateral: Bladder) BIOPSY, PROSTATE, RECTAL APPROACH, WITH US  GUIDANCE (Rectum) ULTRASOUND, RECTAL APPROACH (Rectum) TURBT (TRANSURETHRAL RESECTION OF BLADDER TUMOR)  Patient Location: PACU  Anesthesia Type:General  Level of Consciousness: awake  Airway & Oxygen Therapy: Patient Spontanous Breathing and Patient connected to face mask  Post-op Assessment: Report given to RN and Post -op Vital signs reviewed and stable  Post vital signs: Reviewed and stable  Last Vitals:  Vitals Value Taken Time  BP 149/94 03/24/24 18:00  Temp    Pulse 91 03/24/24 18:00  Resp 14 03/24/24 18:00  SpO2 95 % 03/24/24 18:00  Vitals shown include unfiled device data.  Last Pain:  Vitals:   03/24/24 1258  TempSrc: Oral         Complications: No notable events documented.

## 2024-03-24 NOTE — Anesthesia Procedure Notes (Signed)
 Procedure Name: Intubation Date/Time: 03/24/2024 4:52 PM  Performed by: Judythe Tanda Aran, CRNAPre-anesthesia Checklist: Patient identified, Emergency Drugs available, Suction available and Patient being monitored Patient Re-evaluated:Patient Re-evaluated prior to induction Oxygen Delivery Method: Circle system utilized Preoxygenation: Pre-oxygenation with 100% oxygen Induction Type: IV induction Ventilation: Mask ventilation with difficulty Laryngoscope Size: Miller and 2 Grade View: Grade I Tube type: Oral Tube size: 7.5 mm Number of attempts: 1 Airway Equipment and Method: Stylet and Oral airway Placement Confirmation: ETT inserted through vocal cords under direct vision, positive ETCO2 and breath sounds checked- equal and bilateral Secured at: 21 cm Tube secured with: Tape Dental Injury: Teeth and Oropharynx as per pre-operative assessment

## 2024-03-25 ENCOUNTER — Encounter (HOSPITAL_COMMUNITY): Payer: Self-pay | Admitting: Urology

## 2024-03-26 LAB — SURGICAL PATHOLOGY

## 2024-04-02 ENCOUNTER — Ambulatory Visit: Payer: Self-pay | Admitting: Cardiovascular Disease

## 2024-04-04 ENCOUNTER — Inpatient Hospital Stay: Attending: Internal Medicine

## 2024-04-04 ENCOUNTER — Inpatient Hospital Stay

## 2024-04-04 VITALS — BP 118/80 | HR 83 | Temp 97.9°F | Resp 16

## 2024-04-04 DIAGNOSIS — Z79624 Long term (current) use of inhibitors of nucleotide synthesis: Secondary | ICD-10-CM | POA: Diagnosis not present

## 2024-04-04 DIAGNOSIS — Z7982 Long term (current) use of aspirin: Secondary | ICD-10-CM | POA: Insufficient documentation

## 2024-04-04 DIAGNOSIS — Z7961 Long term (current) use of immunomodulator: Secondary | ICD-10-CM | POA: Insufficient documentation

## 2024-04-04 DIAGNOSIS — Z79899 Other long term (current) drug therapy: Secondary | ICD-10-CM | POA: Insufficient documentation

## 2024-04-04 DIAGNOSIS — I4891 Unspecified atrial fibrillation: Secondary | ICD-10-CM | POA: Diagnosis not present

## 2024-04-04 DIAGNOSIS — C9002 Multiple myeloma in relapse: Secondary | ICD-10-CM | POA: Insufficient documentation

## 2024-04-04 DIAGNOSIS — Z801 Family history of malignant neoplasm of trachea, bronchus and lung: Secondary | ICD-10-CM | POA: Diagnosis not present

## 2024-04-04 DIAGNOSIS — Z5112 Encounter for antineoplastic immunotherapy: Secondary | ICD-10-CM | POA: Diagnosis present

## 2024-04-04 DIAGNOSIS — D801 Nonfamilial hypogammaglobulinemia: Secondary | ICD-10-CM | POA: Insufficient documentation

## 2024-04-04 DIAGNOSIS — Z7969 Long term (current) use of other immunomodulators and immunosuppressants: Secondary | ICD-10-CM | POA: Insufficient documentation

## 2024-04-04 LAB — CBC WITH DIFFERENTIAL (CANCER CENTER ONLY)
Abs Immature Granulocytes: 0.01 K/uL (ref 0.00–0.07)
Basophils Absolute: 0.1 K/uL (ref 0.0–0.1)
Basophils Relative: 1 %
Eosinophils Absolute: 0.2 K/uL (ref 0.0–0.5)
Eosinophils Relative: 4 %
HCT: 44.3 % (ref 39.0–52.0)
Hemoglobin: 15 g/dL (ref 13.0–17.0)
Immature Granulocytes: 0 %
Lymphocytes Relative: 11 %
Lymphs Abs: 0.6 K/uL — ABNORMAL LOW (ref 0.7–4.0)
MCH: 29.6 pg (ref 26.0–34.0)
MCHC: 33.9 g/dL (ref 30.0–36.0)
MCV: 87.5 fL (ref 80.0–100.0)
Monocytes Absolute: 0.6 K/uL (ref 0.1–1.0)
Monocytes Relative: 11 %
Neutro Abs: 4.1 K/uL (ref 1.7–7.7)
Neutrophils Relative %: 73 %
Platelet Count: 180 K/uL (ref 150–400)
RBC: 5.06 MIL/uL (ref 4.22–5.81)
RDW: 16.4 % — ABNORMAL HIGH (ref 11.5–15.5)
WBC Count: 5.7 K/uL (ref 4.0–10.5)
nRBC: 0 % (ref 0.0–0.2)

## 2024-04-04 LAB — CMP (CANCER CENTER ONLY)
ALT: 27 U/L (ref 0–44)
AST: 27 U/L (ref 15–41)
Albumin: 3.9 g/dL (ref 3.5–5.0)
Alkaline Phosphatase: 62 U/L (ref 38–126)
Anion gap: 10 (ref 5–15)
BUN: 11 mg/dL (ref 8–23)
CO2: 25 mmol/L (ref 22–32)
Calcium: 9.2 mg/dL (ref 8.9–10.3)
Chloride: 101 mmol/L (ref 98–111)
Creatinine: 0.89 mg/dL (ref 0.61–1.24)
GFR, Estimated: >60 mL/min
Glucose, Bld: 112 mg/dL — ABNORMAL HIGH (ref 70–99)
Potassium: 4.1 mmol/L (ref 3.5–5.1)
Sodium: 136 mmol/L (ref 135–145)
Total Bilirubin: 0.8 mg/dL (ref 0.0–1.2)
Total Protein: 6.6 g/dL (ref 6.5–8.1)

## 2024-04-04 MED ORDER — PROCHLORPERAZINE MALEATE 10 MG PO TABS: 10.0000 mg | ORAL_TABLET | Freq: Once | ORAL | Status: DC

## 2024-04-04 MED ORDER — DEXAMETHASONE 4 MG PO TABS: 20.0000 mg | ORAL_TABLET | Freq: Once | ORAL | Status: DC

## 2024-04-04 MED ORDER — BORTEZOMIB CHEMO SQ INJECTION 3.5 MG (2.5MG/ML)
1.3000 mg/m2 | Freq: Once | INTRAMUSCULAR | Status: AC
  Administered 2024-04-04: 3 mg via SUBCUTANEOUS
  Filled 2024-04-04: qty 1.2

## 2024-04-04 NOTE — Patient Instructions (Signed)
 [facility] - A DEPT OF [place]  Discharge Instructions: Thank you for choosing [facility] to provide your oncology and hematology care.  If you have a lab appointment with the Cancer Center, please go directly to the Cancer Center and check in at the registration area.  Wear comfortable clothing and clothing appropriate for easy access to any Portacath or PICC line.   We strive to give you quality time with your provider. You may need to reschedule your appointment if you arrive late (15 or more minutes).  Arriving late affects you and other patients whose appointments are after yours.  Also, if you miss three or more appointments without notifying the office, you may be dismissed from the clinic at the provider's discretion.      For prescription refill requests, have your pharmacy contact our office and allow 72 hours for refills to be completed.    Today you received the following chemotherapy and/or immunotherapy agents Velcade       To help prevent nausea and vomiting after your treatment, we encourage you to take your nausea medication as directed.  BELOW ARE SYMPTOMS THAT SHOULD BE REPORTED IMMEDIATELY: *FEVER GREATER THAN 100.4 F (38 C) OR HIGHER *CHILLS OR SWEATING *NAUSEA AND VOMITING THAT IS NOT CONTROLLED WITH YOUR NAUSEA MEDICATION *UNUSUAL SHORTNESS OF BREATH *UNUSUAL BRUISING OR BLEEDING *URINARY PROBLEMS (pain or burning when urinating, or frequent urination) *BOWEL PROBLEMS (unusual diarrhea, constipation, pain near the anus) TENDERNESS IN MOUTH AND THROAT WITH OR WITHOUT PRESENCE OF ULCERS (sore throat, sores in mouth, or a toothache) UNUSUAL RASH, SWELLING OR PAIN  UNUSUAL VAGINAL DISCHARGE OR ITCHING   Items with * indicate a potential emergency and should be followed up as soon as possible or go to the Emergency Department if any problems should occur.  Please show the CHEMOTHERAPY ALERT CARD or IMMUNOTHERAPY  ALERT CARD at check-in to the Emergency Department and triage nurse.  Should you have questions after your visit or need to cancel or reschedule your appointment, please contact [facility] - A DEPT OF [facility]  [phone] and follow the prompts.  Office hours are 8:00 a.m. to 4:30 p.m. Monday - Friday. Please note that voicemails left after 4:00 p.m. may not be returned until the following business day.  We are closed weekends and major holidays. You have access to a nurse at all times for urgent questions. Please call the main number to the clinic [phone] and follow the prompts.  For any non-urgent questions, you may also contact your provider using MyChart. We now offer e-Visits for anyone 77 and older to request care online for non-urgent symptoms. For details visit [email or web address].   Also download the MyChart app! Go to the app store, search MyChart, open the app, select [facility], and log in with your MyChart username and password.     Bortezomib Injection What is this medication? BORTEZOMIB (bor TEZ oh mib) treats lymphoma. It may also be used to treat multiple myeloma, a type of bone marrow cancer. It works by blocking a protein that causes cancer cells to grow and multiply. This helps to slow or stop the spread of cancer cells. This medicine may be used for other purposes; ask your health care provider or pharmacist if you have questions. COMMON BRAND NAME(S): BORUZU, Velcade What should I tell my care team before I take this medication? They need to know if  you have any of these conditions: Dehydration Diabetes Heart disease Liver disease Tingling of the fingers or toes or other nerve disorder An unusual or allergic reaction to bortezomib, other medications, foods, dyes, or preservatives If you or your partner are pregnant or trying to get pregnant Breastfeeding How should I use this medication? This medication is injected  into a vein or under the skin. It is given by your care team in a hospital or clinic setting. Talk to your care team about the use of this medication in children. Special care may be needed. Overdosage: If you think you have taken too much of this medicine contact a poison control center or emergency room at once. NOTE: This medicine is only for you. Do not share this medicine with others. What if I miss a dose? Keep appointments for follow-up doses. It is important not to miss your dose. Call your care team if you are unable to keep an appointment. What may interact with this medication? Ketoconazole Rifampin This list may not describe all possible interactions. Give your health care provider a list of all the medicines, herbs, non-prescription drugs, or dietary supplements you use. Also tell them if you smoke, drink alcohol, or use illegal drugs. Some items may interact with your medicine. What should I watch for while using this medication? Your condition will be monitored carefully while you are receiving this medication. You may need blood work while taking this medication. This medication may affect your coordination, reaction time, or judgment. Do not drive or operate machinery until you know how this medication affects you. Sit up or stand slowly to reduce the risk of dizzy or fainting spells. Drinking alcohol with this medication can increase the risk of these side effects. This medication may increase your risk of getting an infection. Call your care team for advice if you get a fever, chills, sore throat, or other symptoms of a cold or flu. Do not treat yourself. Try to avoid being around people who are sick. Check with your care team if you have severe diarrhea, nausea, and vomiting, or if you sweat a lot. The loss of too much body fluid may make it dangerous for you to take this medication. Talk to your care team if you may be pregnant. Serious birth defects can occur if you take this  medication during pregnancy and for 7 months after the last dose. You will need a negative pregnancy test before starting this medication. Contraception is recommended while taking this medication and for 7 months after the last dose. Your care team can help you find the option that works for you. If your partner can get pregnant, use a condom during sex while taking this medication and for 4 months after the last dose. Do not breastfeed while taking this medication and for 2 months after the last dose. This medication may cause infertility. Talk to your care team if you are concerned about your fertility. What side effects may I notice from receiving this medication? Side effects that you should report to your care team as soon as possible: Allergic reactions--skin rash, itching, hives, swelling of the face, lips, tongue, or throat Bleeding--bloody or black, tar-like stools, vomiting blood or brown material that looks like coffee grounds, red or dark brown urine, small red or purple spots on skin, unusual bruising or bleeding Bleeding in the brain--severe headache, stiff neck, confusion, dizziness, change in vision, numbness or weakness of the face, arm, or leg, trouble speaking, trouble walking, vomiting Bowel  blockage--stomach cramping, unable to have a bowel movement or pass gas, loss of appetite, vomiting Heart failure--shortness of breath, swelling of the ankles, feet, or hands, sudden weight gain, unusual weakness or fatigue Infection--fever, chills, cough, sore throat, wounds that don't heal, pain or trouble when passing urine, general feeling of discomfort or being unwell Liver injury--right upper belly pain, loss of appetite, nausea, light-colored stool, dark yellow or brown urine, yellowing skin or eyes, unusual weakness or fatigue Low blood pressure--dizziness, feeling faint or lightheaded, blurry vision Lung injury--shortness of breath or trouble breathing, cough, spitting up blood, chest  pain, fever Pain, tingling, or numbness in the hands or feet Severe or prolonged diarrhea Stomach pain, bloody diarrhea, pale skin, unusual weakness or fatigue, decrease in the amount of urine, which may be signs of hemolytic uremic syndrome Sudden and severe headache, confusion, change in vision, seizures, which may be signs of posterior reversible encephalopathy syndrome (PRES) TTP--purple spots on the skin or inside the mouth, pale skin, yellowing skin or eyes, unusual weakness or fatigue, fever, fast or irregular heartbeat, confusion, change in vision, trouble speaking, trouble walking Tumor lysis syndrome (TLS)--nausea, vomiting, diarrhea, decrease in the amount of urine, dark urine, unusual weakness or fatigue, confusion, muscle pain or cramps, fast or irregular heartbeat, joint pain Side effects that usually do not require medical attention (report to your care team if they continue or are bothersome): Constipation Diarrhea Fatigue Loss of appetite Nausea This list may not describe all possible side effects. Call your doctor for medical advice about side effects. You may report side effects to FDA at 1-800-FDA-1088. Where should I keep my medication? This medication is given in a hospital or clinic. It will not be stored at home. NOTE: This sheet is a summary. It may not cover all possible information. If you have questions about this medicine, talk to your doctor, pharmacist, or health care provider.  2024 [employer]/Gold Standard (2021-08-23 00:00:00)

## 2024-04-07 ENCOUNTER — Ambulatory Visit: Payer: Self-pay | Admitting: Adult Health

## 2024-04-07 ENCOUNTER — Other Ambulatory Visit: Payer: Self-pay | Admitting: Internal Medicine

## 2024-04-07 LAB — KAPPA/LAMBDA LIGHT CHAINS
Kappa free light chain: 3.1 mg/L — ABNORMAL LOW (ref 3.3–19.4)
Kappa, lambda light chain ratio: 1.29 (ref 0.26–1.65)
Lambda free light chains: 2.4 mg/L — ABNORMAL LOW (ref 5.7–26.3)

## 2024-04-08 DIAGNOSIS — C9000 Multiple myeloma not having achieved remission: Secondary | ICD-10-CM

## 2024-04-08 LAB — MULTIPLE MYELOMA PANEL, SERUM
Albumin SerPl Elph-Mcnc: 3.2 g/dL (ref 2.9–4.4)
Albumin/Glob SerPl: 1.2 (ref 0.7–1.7)
Alpha 1: 0.3 g/dL (ref 0.0–0.4)
Alpha2 Glob SerPl Elph-Mcnc: 1 g/dL (ref 0.4–1.0)
B-Globulin SerPl Elph-Mcnc: 1 g/dL (ref 0.7–1.3)
Gamma Glob SerPl Elph-Mcnc: 0.6 g/dL (ref 0.4–1.8)
Globulin, Total: 2.9 g/dL (ref 2.2–3.9)
IgA: 15 mg/dL — ABNORMAL LOW (ref 61–437)
IgG (Immunoglobin G), Serum: 649 mg/dL (ref 603–1613)
IgM (Immunoglobulin M), Srm: 14 mg/dL — ABNORMAL LOW (ref 20–172)
Total Protein ELP: 6.1 g/dL (ref 6.0–8.5)

## 2024-04-08 MED ORDER — LENALIDOMIDE 20 MG PO CAPS: 20.0000 mg | ORAL_CAPSULE | Freq: Every day | ORAL | 0 refills | Status: DC

## 2024-04-10 ENCOUNTER — Inpatient Hospital Stay

## 2024-04-10 VITALS — BP 122/72 | HR 68 | Temp 97.5°F | Resp 18 | Wt 259.0 lb

## 2024-04-10 DIAGNOSIS — C9002 Multiple myeloma in relapse: Secondary | ICD-10-CM

## 2024-04-10 DIAGNOSIS — Z5112 Encounter for antineoplastic immunotherapy: Secondary | ICD-10-CM | POA: Diagnosis not present

## 2024-04-10 MED ORDER — DEXAMETHASONE 4 MG PO TABS: 20.0000 mg | ORAL_TABLET | Freq: Once | ORAL | Status: DC

## 2024-04-10 MED ORDER — DEXTROSE 5 % IV SOLN
INTRAVENOUS | Status: DC
  Filled 2024-04-10: qty 250

## 2024-04-10 MED ORDER — IMMUNE GLOBULIN (HUMAN) 10 GM/100ML IV SOLN
400.0000 mg/kg | Freq: Once | INTRAVENOUS | Status: AC
  Administered 2024-04-10: 35 g via INTRAVENOUS
  Filled 2024-04-10: qty 200

## 2024-04-10 MED ORDER — BORTEZOMIB CHEMO SQ INJECTION 3.5 MG (2.5MG/ML): 1.3000 mg/m2 | Freq: Once | INTRAMUSCULAR | Status: DC

## 2024-04-10 MED ORDER — PROCHLORPERAZINE MALEATE 10 MG PO TABS: 10.0000 mg | ORAL_TABLET | Freq: Once | ORAL | Status: DC

## 2024-04-10 MED ORDER — ACETAMINOPHEN 325 MG PO TABS
650.0000 mg | ORAL_TABLET | Freq: Once | ORAL | Status: AC
  Administered 2024-04-10: 650 mg via ORAL
  Filled 2024-04-10: qty 2

## 2024-04-10 MED ORDER — DIPHENHYDRAMINE HCL 25 MG PO TABS
25.0000 mg | ORAL_TABLET | Freq: Once | ORAL | Status: AC
  Administered 2024-04-10: 25 mg via ORAL
  Filled 2024-04-10: qty 1

## 2024-04-11 ENCOUNTER — Inpatient Hospital Stay

## 2024-04-11 ENCOUNTER — Encounter: Payer: Self-pay | Admitting: Internal Medicine

## 2024-04-11 VITALS — BP 119/71 | HR 80 | Temp 97.5°F | Resp 16

## 2024-04-11 DIAGNOSIS — Z5112 Encounter for antineoplastic immunotherapy: Secondary | ICD-10-CM | POA: Diagnosis not present

## 2024-04-11 DIAGNOSIS — C9002 Multiple myeloma in relapse: Secondary | ICD-10-CM

## 2024-04-11 LAB — CMP (CANCER CENTER ONLY)
ALT: 29 U/L (ref 0–44)
AST: 33 U/L (ref 15–41)
Albumin: 3.7 g/dL (ref 3.5–5.0)
Alkaline Phosphatase: 58 U/L (ref 38–126)
Anion gap: 9 (ref 5–15)
BUN: <5 mg/dL — ABNORMAL LOW (ref 8–23)
CO2: 27 mmol/L (ref 22–32)
Calcium: 8.7 mg/dL — ABNORMAL LOW (ref 8.9–10.3)
Chloride: 105 mmol/L (ref 98–111)
Creatinine: 0.91 mg/dL (ref 0.61–1.24)
GFR, Estimated: >60 mL/min
Glucose, Bld: 102 mg/dL — ABNORMAL HIGH (ref 70–99)
Potassium: 4 mmol/L (ref 3.5–5.1)
Sodium: 141 mmol/L (ref 135–145)
Total Bilirubin: 0.4 mg/dL (ref 0.0–1.2)
Total Protein: 6.6 g/dL (ref 6.5–8.1)

## 2024-04-11 LAB — CBC WITH DIFFERENTIAL (CANCER CENTER ONLY)
Abs Immature Granulocytes: 0.05 K/uL (ref 0.00–0.07)
Basophils Absolute: 0 K/uL (ref 0.0–0.1)
Basophils Relative: 0 %
Eosinophils Absolute: 0.2 K/uL (ref 0.0–0.5)
Eosinophils Relative: 3 %
HCT: 42 % (ref 39.0–52.0)
Hemoglobin: 13.8 g/dL (ref 13.0–17.0)
Immature Granulocytes: 1 %
Lymphocytes Relative: 6 %
Lymphs Abs: 0.4 K/uL — ABNORMAL LOW (ref 0.7–4.0)
MCH: 29 pg (ref 26.0–34.0)
MCHC: 32.9 g/dL (ref 30.0–36.0)
MCV: 88.2 fL (ref 80.0–100.0)
Monocytes Absolute: 0.5 K/uL (ref 0.1–1.0)
Monocytes Relative: 8 %
Neutro Abs: 5.3 K/uL (ref 1.7–7.7)
Neutrophils Relative %: 82 %
Platelet Count: 160 K/uL (ref 150–400)
RBC: 4.76 MIL/uL (ref 4.22–5.81)
RDW: 16.9 % — ABNORMAL HIGH (ref 11.5–15.5)
WBC Count: 6.5 K/uL (ref 4.0–10.5)
nRBC: 0 % (ref 0.0–0.2)

## 2024-04-11 MED ORDER — BORTEZOMIB CHEMO SQ INJECTION 3.5 MG (2.5MG/ML)
1.3000 mg/m2 | Freq: Once | INTRAMUSCULAR | Status: AC
  Administered 2024-04-11: 3 mg via SUBCUTANEOUS
  Filled 2024-04-11: qty 1.2

## 2024-04-11 NOTE — Patient Instructions (Signed)
 [facility] - A DEPT OF [place]  Discharge Instructions: Thank you for choosing [facility] to provide your oncology and hematology care.  If you have a lab appointment with the Cancer Center, please go directly to the Cancer Center and check in at the registration area.  Wear comfortable clothing and clothing appropriate for easy access to any Portacath or PICC line.   We strive to give you quality time with your provider. You may need to reschedule your appointment if you arrive late (15 or more minutes).  Arriving late affects you and other patients whose appointments are after yours.  Also, if you miss three or more appointments without notifying the office, you may be dismissed from the clinic at the provider's discretion.      For prescription refill requests, have your pharmacy contact our office and allow 72 hours for refills to be completed.    Today you received the following chemotherapy and/or immunotherapy agents Velcade       To help prevent nausea and vomiting after your treatment, we encourage you to take your nausea medication as directed.  BELOW ARE SYMPTOMS THAT SHOULD BE REPORTED IMMEDIATELY: *FEVER GREATER THAN 100.4 F (38 C) OR HIGHER *CHILLS OR SWEATING *NAUSEA AND VOMITING THAT IS NOT CONTROLLED WITH YOUR NAUSEA MEDICATION *UNUSUAL SHORTNESS OF BREATH *UNUSUAL BRUISING OR BLEEDING *URINARY PROBLEMS (pain or burning when urinating, or frequent urination) *BOWEL PROBLEMS (unusual diarrhea, constipation, pain near the anus) TENDERNESS IN MOUTH AND THROAT WITH OR WITHOUT PRESENCE OF ULCERS (sore throat, sores in mouth, or a toothache) UNUSUAL RASH, SWELLING OR PAIN  UNUSUAL VAGINAL DISCHARGE OR ITCHING   Items with * indicate a potential emergency and should be followed up as soon as possible or go to the Emergency Department if any problems should occur.  Please show the CHEMOTHERAPY ALERT CARD or IMMUNOTHERAPY  ALERT CARD at check-in to the Emergency Department and triage nurse.  Should you have questions after your visit or need to cancel or reschedule your appointment, please contact [facility] - A DEPT OF [facility]  [phone] and follow the prompts.  Office hours are 8:00 a.m. to 4:30 p.m. Monday - Friday. Please note that voicemails left after 4:00 p.m. may not be returned until the following business day.  We are closed weekends and major holidays. You have access to a nurse at all times for urgent questions. Please call the main number to the clinic [phone] and follow the prompts.  For any non-urgent questions, you may also contact your provider using MyChart. We now offer e-Visits for anyone 77 and older to request care online for non-urgent symptoms. For details visit [email or web address].   Also download the MyChart app! Go to the app store, search MyChart, open the app, select [facility], and log in with your MyChart username and password.     Bortezomib Injection What is this medication? BORTEZOMIB (bor TEZ oh mib) treats lymphoma. It may also be used to treat multiple myeloma, a type of bone marrow cancer. It works by blocking a protein that causes cancer cells to grow and multiply. This helps to slow or stop the spread of cancer cells. This medicine may be used for other purposes; ask your health care provider or pharmacist if you have questions. COMMON BRAND NAME(S): BORUZU, Velcade What should I tell my care team before I take this medication? They need to know if  you have any of these conditions: Dehydration Diabetes Heart disease Liver disease Tingling of the fingers or toes or other nerve disorder An unusual or allergic reaction to bortezomib, other medications, foods, dyes, or preservatives If you or your partner are pregnant or trying to get pregnant Breastfeeding How should I use this medication? This medication is injected  into a vein or under the skin. It is given by your care team in a hospital or clinic setting. Talk to your care team about the use of this medication in children. Special care may be needed. Overdosage: If you think you have taken too much of this medicine contact a poison control center or emergency room at once. NOTE: This medicine is only for you. Do not share this medicine with others. What if I miss a dose? Keep appointments for follow-up doses. It is important not to miss your dose. Call your care team if you are unable to keep an appointment. What may interact with this medication? Ketoconazole Rifampin This list may not describe all possible interactions. Give your health care provider a list of all the medicines, herbs, non-prescription drugs, or dietary supplements you use. Also tell them if you smoke, drink alcohol, or use illegal drugs. Some items may interact with your medicine. What should I watch for while using this medication? Your condition will be monitored carefully while you are receiving this medication. You may need blood work while taking this medication. This medication may affect your coordination, reaction time, or judgment. Do not drive or operate machinery until you know how this medication affects you. Sit up or stand slowly to reduce the risk of dizzy or fainting spells. Drinking alcohol with this medication can increase the risk of these side effects. This medication may increase your risk of getting an infection. Call your care team for advice if you get a fever, chills, sore throat, or other symptoms of a cold or flu. Do not treat yourself. Try to avoid being around people who are sick. Check with your care team if you have severe diarrhea, nausea, and vomiting, or if you sweat a lot. The loss of too much body fluid may make it dangerous for you to take this medication. Talk to your care team if you may be pregnant. Serious birth defects can occur if you take this  medication during pregnancy and for 7 months after the last dose. You will need a negative pregnancy test before starting this medication. Contraception is recommended while taking this medication and for 7 months after the last dose. Your care team can help you find the option that works for you. If your partner can get pregnant, use a condom during sex while taking this medication and for 4 months after the last dose. Do not breastfeed while taking this medication and for 2 months after the last dose. This medication may cause infertility. Talk to your care team if you are concerned about your fertility. What side effects may I notice from receiving this medication? Side effects that you should report to your care team as soon as possible: Allergic reactions--skin rash, itching, hives, swelling of the face, lips, tongue, or throat Bleeding--bloody or black, tar-like stools, vomiting blood or brown material that looks like coffee grounds, red or dark brown urine, small red or purple spots on skin, unusual bruising or bleeding Bleeding in the brain--severe headache, stiff neck, confusion, dizziness, change in vision, numbness or weakness of the face, arm, or leg, trouble speaking, trouble walking, vomiting Bowel  blockage--stomach cramping, unable to have a bowel movement or pass gas, loss of appetite, vomiting Heart failure--shortness of breath, swelling of the ankles, feet, or hands, sudden weight gain, unusual weakness or fatigue Infection--fever, chills, cough, sore throat, wounds that don't heal, pain or trouble when passing urine, general feeling of discomfort or being unwell Liver injury--right upper belly pain, loss of appetite, nausea, light-colored stool, dark yellow or brown urine, yellowing skin or eyes, unusual weakness or fatigue Low blood pressure--dizziness, feeling faint or lightheaded, blurry vision Lung injury--shortness of breath or trouble breathing, cough, spitting up blood, chest  pain, fever Pain, tingling, or numbness in the hands or feet Severe or prolonged diarrhea Stomach pain, bloody diarrhea, pale skin, unusual weakness or fatigue, decrease in the amount of urine, which may be signs of hemolytic uremic syndrome Sudden and severe headache, confusion, change in vision, seizures, which may be signs of posterior reversible encephalopathy syndrome (PRES) TTP--purple spots on the skin or inside the mouth, pale skin, yellowing skin or eyes, unusual weakness or fatigue, fever, fast or irregular heartbeat, confusion, change in vision, trouble speaking, trouble walking Tumor lysis syndrome (TLS)--nausea, vomiting, diarrhea, decrease in the amount of urine, dark urine, unusual weakness or fatigue, confusion, muscle pain or cramps, fast or irregular heartbeat, joint pain Side effects that usually do not require medical attention (report to your care team if they continue or are bothersome): Constipation Diarrhea Fatigue Loss of appetite Nausea This list may not describe all possible side effects. Call your doctor for medical advice about side effects. You may report side effects to FDA at 1-800-FDA-1088. Where should I keep my medication? This medication is given in a hospital or clinic. It will not be stored at home. NOTE: This sheet is a summary. It may not cover all possible information. If you have questions about this medicine, talk to your doctor, pharmacist, or health care provider.  2024 [employer]/Gold Standard (2021-08-23 00:00:00)

## 2024-04-13 ENCOUNTER — Encounter: Payer: Self-pay | Admitting: Internal Medicine

## 2024-04-14 ENCOUNTER — Encounter

## 2024-04-14 ENCOUNTER — Encounter: Payer: Self-pay | Admitting: Internal Medicine

## 2024-04-14 ENCOUNTER — Other Ambulatory Visit: Payer: Self-pay | Admitting: Internal Medicine

## 2024-04-14 DIAGNOSIS — C9002 Multiple myeloma in relapse: Secondary | ICD-10-CM

## 2024-04-14 NOTE — Progress Notes (Signed)
#   on 1/23; 2/20-and on 2/Please schedule-chemoinfusion as ordered-MD labs-CBC CMP-MM panel; K/l lambda light chains-  # On 2/06-schedule IVIG infusion- ONLY-   # Revlimid  is 10 mg-Daily/continuously. No days off necessary during the maintenance phase.  Please send a new prescription starting 1/23-  GB

## 2024-04-15 ENCOUNTER — Other Ambulatory Visit: Payer: Self-pay | Admitting: *Deleted

## 2024-04-15 ENCOUNTER — Encounter

## 2024-04-15 ENCOUNTER — Telehealth: Payer: Self-pay | Admitting: Pharmacist

## 2024-04-15 DIAGNOSIS — C9000 Multiple myeloma not having achieved remission: Secondary | ICD-10-CM

## 2024-04-16 ENCOUNTER — Ambulatory Visit

## 2024-04-16 ENCOUNTER — Ambulatory Visit: Payer: Self-pay | Admitting: Student in an Organized Health Care Education/Training Program

## 2024-04-16 ENCOUNTER — Ambulatory Visit: Payer: Medicare Other

## 2024-04-16 DIAGNOSIS — I4819 Other persistent atrial fibrillation: Secondary | ICD-10-CM

## 2024-04-16 LAB — CUP PACEART REMOTE DEVICE CHECK
Date Time Interrogation Session: 20260113230249
Implantable Pulse Generator Implant Date: 20210518

## 2024-04-16 MED ORDER — LENALIDOMIDE 10 MG PO CAPS: 10.0000 mg | ORAL_CAPSULE | Freq: Every day | ORAL | 0 refills | Status: AC

## 2024-04-16 NOTE — Telephone Encounter (Signed)
 MD starting patient on maintenance therapy.  MD aware that [facility] protocol doses lenalidomide  at 21 on and 1 week of-however given his residual disease MD would prefer 10 mg daily continuous dosing.  Spoke with Davina, he knows his prescription was sent to [facility] and I reviewed the dosing with him.

## 2024-04-16 NOTE — Progress Notes (Signed)
 Spoke to patient regarding the plan to proceed with Revlimid  10 mg continuous 28-day cycle .  Will discuss further at the next visit

## 2024-04-17 ENCOUNTER — Encounter

## 2024-04-21 NOTE — Progress Notes (Signed)
 Remote Loop Recorder Transmission

## 2024-04-22 ENCOUNTER — Other Ambulatory Visit: Payer: Self-pay | Admitting: *Deleted

## 2024-04-22 DIAGNOSIS — C9000 Multiple myeloma not having achieved remission: Secondary | ICD-10-CM

## 2024-04-25 ENCOUNTER — Encounter: Payer: Self-pay | Admitting: Internal Medicine

## 2024-04-25 ENCOUNTER — Inpatient Hospital Stay: Admitting: Internal Medicine

## 2024-04-25 ENCOUNTER — Inpatient Hospital Stay

## 2024-04-25 ENCOUNTER — Other Ambulatory Visit: Payer: Self-pay

## 2024-04-25 VITALS — BP 133/85 | HR 76 | Temp 97.6°F | Resp 16 | Ht 71.0 in | Wt 267.2 lb

## 2024-04-25 DIAGNOSIS — C9002 Multiple myeloma in relapse: Secondary | ICD-10-CM

## 2024-04-25 DIAGNOSIS — C9000 Multiple myeloma not having achieved remission: Secondary | ICD-10-CM

## 2024-04-25 DIAGNOSIS — Z5112 Encounter for antineoplastic immunotherapy: Secondary | ICD-10-CM | POA: Diagnosis not present

## 2024-04-25 LAB — CMP (CANCER CENTER ONLY)
ALT: 25 U/L (ref 0–44)
AST: 27 U/L (ref 15–41)
Albumin: 3.7 g/dL (ref 3.5–5.0)
Alkaline Phosphatase: 64 U/L (ref 38–126)
Anion gap: 10 (ref 5–15)
BUN: 10 mg/dL (ref 8–23)
CO2: 25 mmol/L (ref 22–32)
Calcium: 8.6 mg/dL — ABNORMAL LOW (ref 8.9–10.3)
Chloride: 106 mmol/L (ref 98–111)
Creatinine: 0.79 mg/dL (ref 0.61–1.24)
GFR, Estimated: >60 mL/min
Glucose, Bld: 100 mg/dL — ABNORMAL HIGH (ref 70–99)
Potassium: 3.7 mmol/L (ref 3.5–5.1)
Sodium: 140 mmol/L (ref 135–145)
Total Bilirubin: 0.5 mg/dL (ref 0.0–1.2)
Total Protein: 6.1 g/dL — ABNORMAL LOW (ref 6.5–8.1)

## 2024-04-25 LAB — MISCELLANEOUS TEST

## 2024-04-25 LAB — CBC WITH DIFFERENTIAL (CANCER CENTER ONLY)
Abs Immature Granulocytes: 0.01 K/uL (ref 0.00–0.07)
Basophils Absolute: 0.1 K/uL (ref 0.0–0.1)
Basophils Relative: 2 %
Eosinophils Absolute: 0.2 K/uL (ref 0.0–0.5)
Eosinophils Relative: 3 %
HCT: 39.1 % (ref 39.0–52.0)
Hemoglobin: 13 g/dL (ref 13.0–17.0)
Immature Granulocytes: 0 %
Lymphocytes Relative: 10 %
Lymphs Abs: 0.6 K/uL — ABNORMAL LOW (ref 0.7–4.0)
MCH: 29.5 pg (ref 26.0–34.0)
MCHC: 33.2 g/dL (ref 30.0–36.0)
MCV: 88.7 fL (ref 80.0–100.0)
Monocytes Absolute: 1 K/uL (ref 0.1–1.0)
Monocytes Relative: 18 %
Neutro Abs: 3.6 K/uL (ref 1.7–7.7)
Neutrophils Relative %: 67 %
Platelet Count: 161 K/uL (ref 150–400)
RBC: 4.41 MIL/uL (ref 4.22–5.81)
RDW: 16.5 % — ABNORMAL HIGH (ref 11.5–15.5)
WBC Count: 5.4 K/uL (ref 4.0–10.5)
nRBC: 0 % (ref 0.0–0.2)

## 2024-04-25 MED ORDER — MONTELUKAST SODIUM 10 MG PO TABS: 10.0000 mg | ORAL_TABLET | Freq: Once | ORAL | Status: DC

## 2024-04-25 MED ORDER — DEXAMETHASONE 4 MG PO TABS
20.0000 mg | ORAL_TABLET | Freq: Once | ORAL | Status: AC
  Administered 2024-04-25: 20 mg via ORAL
  Filled 2024-04-25: qty 5

## 2024-04-25 MED ORDER — ACETAMINOPHEN 325 MG PO TABS: 650.0000 mg | ORAL_TABLET | Freq: Once | ORAL | Status: DC

## 2024-04-25 MED ORDER — DIPHENHYDRAMINE HCL 25 MG PO TABS: 25.0000 mg | ORAL_TABLET | Freq: Once | ORAL | Status: DC

## 2024-04-25 MED ORDER — DARATUMUMAB-HYALURONIDASE-FIHJ 1800-30000 MG-UT/15ML ~~LOC~~ SOLN
1800.0000 mg | Freq: Once | SUBCUTANEOUS | Status: AC
  Administered 2024-04-25: 1800 mg via SUBCUTANEOUS
  Filled 2024-04-25: qty 15

## 2024-04-25 MED ORDER — DEXAMETHASONE 4 MG PO TABS
ORAL_TABLET | ORAL | 3 refills | Status: AC
  Filled 2024-04-25: qty 30, 30d supply, fill #0
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
## 2024-04-25 NOTE — Progress Notes (Signed)
 C/o sob, sinus drainage and insomnia this week. Wants to make sure it's ok to take Sudafed?  Needs refill on dex, pended.  03/24/24 prostate bx/cystoscopy.

## 2024-04-25 NOTE — Assessment & Plan Note (Addendum)
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   S/P evaluation with Dr.Choi [facility]- bone marrow evaluation]. TRANSPLANT ELIGIBLE. 4/25- Currently on Dara-RVD [q 21 d cycle- Griffin trial-status post 5 cycles-bone marrow biopsy [[facility]]-November 28, 2023-MRD -LOW POSITIVE [2 cell/million].  Status post stem cell collection patient declines stem cell transplant.  Proceed with consolidation chemotherapy total of 9 cycles. [facility] SEP 2025- PET scan-negative for any acute myelomatous lesions in the bone.s/p INDUCTION: cycle number 9 day 1- Dara-Rev-Dex [ [q 21 d cycle- Griffin trial-; Revlimid  at 20 mg [dose reduced sec  rash].    # Continue current therapy-continue close monitoring of myeloma labs.  Starting cycle #10- plan dara- rev maintenance. [10 mg once daily on days 1 to 21] of a 28-day treatment cycle; plan Bone marrow Bx/ in end of Feb 2026- plan clonoseq- if possible locally.  # Secondary hypogammaglobinemia-IVIG infusions every  4 weeks-improvement noted in IgG levels monitor for now.  No infections noted. Stable.   # URI-like-symptoms recommend Claritin daily/Flonase as needed.  #  prostate MRI Bx/cystoscopy- dec, 22nd- - NEG Biopsy-   # Multiple bone lesion-- [per pt s/p dental clearance] -Continue ca+Vit D BID. [Vit D 1000/d; 5000 q OD]. vit D 28 [June 2025]-recommend 50,000 units vitamin D  once a week; and recommend calcium 1000 mg once a day- Stable. Zometa   # History of a flutter [s/p ablation in [place] - clip of Left atrial appendage]-no anticoagulation- on asprin; irregular rhythm-2D echo pending.  Stable.  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  # DVT-ID Prophylaxis: asprin/acyclovir -  Stable.  # ACP: had Advance directives.   # # IV access:PIV    #  Zometa-??   MD appt- q 4 w;dara SQ- minatance-   Pre-meds-?PS IVIG-on HOLD # DISPOSITION: # q 4 weeks- MM; K/L light chains # chemo today-  # follow up in 4 weeks- MD: labs- cbc/cmp; MM pane;; K/l light chains; Dara SQ; Zometa- Dr.B

## 2024-04-28 LAB — MULTIPLE MYELOMA PANEL, SERUM
Albumin SerPl Elph-Mcnc: 3 g/dL (ref 2.9–4.4)
Albumin/Glob SerPl: 1.3 (ref 0.7–1.7)
Alpha 1: 0.3 g/dL (ref 0.0–0.4)
Alpha2 Glob SerPl Elph-Mcnc: 0.8 g/dL (ref 0.4–1.0)
B-Globulin SerPl Elph-Mcnc: 0.8 g/dL (ref 0.7–1.3)
Gamma Glob SerPl Elph-Mcnc: 0.6 g/dL (ref 0.4–1.8)
Globulin, Total: 2.5 g/dL (ref 2.2–3.9)
IgA: 17 mg/dL — ABNORMAL LOW (ref 61–437)
IgG (Immunoglobin G), Serum: 686 mg/dL (ref 603–1613)
IgM (Immunoglobulin M), Srm: 12 mg/dL — ABNORMAL LOW (ref 20–172)
Total Protein ELP: 5.5 g/dL — ABNORMAL LOW (ref 6.0–8.5)

## 2024-04-29 LAB — KAPPA/LAMBDA LIGHT CHAINS
Kappa free light chain: 3.1 mg/L — ABNORMAL LOW (ref 3.3–19.4)
Kappa, lambda light chain ratio: 1.24 (ref 0.26–1.65)
Lambda free light chains: 2.5 mg/L — ABNORMAL LOW (ref 5.7–26.3)

## 2024-05-05 ENCOUNTER — Encounter: Admitting: Sports Medicine

## 2024-05-09 ENCOUNTER — Inpatient Hospital Stay: Attending: Internal Medicine

## 2024-05-09 VITALS — BP 113/78 | HR 71 | Temp 97.4°F | Resp 19 | Wt 248.9 lb

## 2024-05-09 DIAGNOSIS — C9002 Multiple myeloma in relapse: Secondary | ICD-10-CM

## 2024-05-09 MED ORDER — IMMUNE GLOBULIN (HUMAN) 10 GM/100ML IV SOLN
400.0000 mg/kg | Freq: Once | INTRAVENOUS | Status: AC
  Administered 2024-05-09: 35 g via INTRAVENOUS
  Filled 2024-05-09: qty 50

## 2024-05-09 MED ORDER — DIPHENHYDRAMINE HCL 25 MG PO TABS
25.0000 mg | ORAL_TABLET | Freq: Once | ORAL | Status: AC
  Administered 2024-05-09: 25 mg via ORAL
  Filled 2024-05-09: qty 1

## 2024-05-09 MED ORDER — DEXTROSE 5 % IV SOLN
INTRAVENOUS | Status: DC
  Filled 2024-05-09: qty 250

## 2024-05-09 MED ORDER — ACETAMINOPHEN 325 MG PO TABS
650.0000 mg | ORAL_TABLET | Freq: Once | ORAL | Status: AC
  Administered 2024-05-09: 650 mg via ORAL
  Filled 2024-05-09: qty 2

## 2024-05-13 ENCOUNTER — Encounter: Admitting: Sports Medicine

## 2024-05-16 ENCOUNTER — Encounter

## 2024-05-17 ENCOUNTER — Encounter

## 2024-05-19 ENCOUNTER — Encounter

## 2024-05-20 ENCOUNTER — Encounter: Payer: Medicare Other | Admitting: Family Medicine

## 2024-05-23 ENCOUNTER — Inpatient Hospital Stay

## 2024-05-23 ENCOUNTER — Inpatient Hospital Stay: Admitting: Internal Medicine

## 2024-05-27 ENCOUNTER — Ambulatory Visit: Admitting: Student in an Organized Health Care Education/Training Program

## 2024-06-17 ENCOUNTER — Encounter

## 2024-06-19 ENCOUNTER — Encounter

## 2024-07-18 ENCOUNTER — Encounter

## 2024-07-21 ENCOUNTER — Encounter

## 2024-08-18 ENCOUNTER — Encounter

## 2024-08-21 ENCOUNTER — Encounter

## 2024-09-18 ENCOUNTER — Encounter

## 2024-09-22 ENCOUNTER — Encounter

## 2024-10-19 ENCOUNTER — Encounter

## 2024-10-23 ENCOUNTER — Encounter
